# Patient Record
Sex: Female | Born: 1960 | Race: White | Hispanic: No | Marital: Married | Smoking: Current every day smoker
Health system: Southern US, Community
[De-identification: ages and names within clinical notes are randomized; demographics above are authoritative.]

## PROBLEM LIST (undated history)

## (undated) DIAGNOSIS — K219 Gastro-esophageal reflux disease without esophagitis: Secondary | ICD-10-CM

## (undated) DIAGNOSIS — N83209 Unspecified ovarian cyst, unspecified side: Secondary | ICD-10-CM

## (undated) DIAGNOSIS — Z87442 Personal history of urinary calculi: Secondary | ICD-10-CM

## (undated) DIAGNOSIS — R110 Nausea: Secondary | ICD-10-CM

## (undated) DIAGNOSIS — N289 Disorder of kidney and ureter, unspecified: Secondary | ICD-10-CM

## (undated) HISTORY — PX: KIDNEY STONE SURGERY: SHX686

## (undated) HISTORY — PX: BREAST LUMPECTOMY: SHX2

## (undated) HISTORY — DX: Nausea: R11.0

## (undated) HISTORY — PX: TUBAL LIGATION: SHX77

## (undated) HISTORY — PX: SHOULDER SURGERY: SHX246

---

## 1995-02-18 HISTORY — PX: CHOLECYSTECTOMY: SHX55

## 1997-11-06 ENCOUNTER — Emergency Department (HOSPITAL_COMMUNITY): Admission: EM | Admit: 1997-11-06 | Discharge: 1997-11-06 | Payer: Self-pay | Admitting: Emergency Medicine

## 1999-05-30 ENCOUNTER — Other Ambulatory Visit: Admission: RE | Admit: 1999-05-30 | Discharge: 1999-05-30 | Payer: Self-pay | Admitting: Family Medicine

## 2003-02-19 ENCOUNTER — Emergency Department (HOSPITAL_COMMUNITY): Admission: AD | Admit: 2003-02-19 | Discharge: 2003-02-19 | Payer: Self-pay | Admitting: Family Medicine

## 2003-06-02 ENCOUNTER — Emergency Department (HOSPITAL_COMMUNITY): Admission: EM | Admit: 2003-06-02 | Discharge: 2003-06-02 | Payer: Self-pay | Admitting: Emergency Medicine

## 2007-06-07 ENCOUNTER — Encounter: Payer: Self-pay | Admitting: Internal Medicine

## 2007-06-14 ENCOUNTER — Encounter: Payer: Self-pay | Admitting: Internal Medicine

## 2007-06-14 ENCOUNTER — Encounter: Admission: RE | Admit: 2007-06-14 | Discharge: 2007-06-14 | Payer: Self-pay | Admitting: Family Medicine

## 2007-06-16 ENCOUNTER — Encounter (INDEPENDENT_AMBULATORY_CARE_PROVIDER_SITE_OTHER): Payer: Self-pay | Admitting: Diagnostic Radiology

## 2007-06-16 ENCOUNTER — Encounter: Admission: RE | Admit: 2007-06-16 | Discharge: 2007-06-16 | Payer: Self-pay | Admitting: Family Medicine

## 2008-02-18 HISTORY — PX: APPENDECTOMY: SHX54

## 2008-02-18 HISTORY — PX: COLONOSCOPY: SHX174

## 2008-03-31 ENCOUNTER — Ambulatory Visit: Payer: Self-pay | Admitting: Infectious Disease

## 2008-03-31 ENCOUNTER — Inpatient Hospital Stay (HOSPITAL_COMMUNITY): Admission: EM | Admit: 2008-03-31 | Discharge: 2008-04-04 | Payer: Self-pay | Admitting: Emergency Medicine

## 2008-04-01 ENCOUNTER — Encounter: Payer: Self-pay | Admitting: Infectious Disease

## 2008-04-19 ENCOUNTER — Ambulatory Visit: Payer: Self-pay | Admitting: *Deleted

## 2008-04-19 ENCOUNTER — Encounter: Payer: Self-pay | Admitting: Internal Medicine

## 2008-04-19 DIAGNOSIS — R300 Dysuria: Secondary | ICD-10-CM | POA: Insufficient documentation

## 2008-04-19 DIAGNOSIS — R109 Unspecified abdominal pain: Secondary | ICD-10-CM | POA: Insufficient documentation

## 2008-04-19 LAB — CONVERTED CEMR LAB
ALT: 13 units/L (ref 0–35)
AST: 15 units/L (ref 0–37)
Albumin: 4.1 g/dL (ref 3.5–5.2)
Alkaline Phosphatase: 65 units/L (ref 39–117)
BUN: 15 mg/dL (ref 6–23)
Band Neutrophils: 0 % (ref 0–10)
Basophils Absolute: 0 10*3/uL (ref 0.0–0.1)
Basophils Relative: 0 % (ref 0–1)
Bilirubin Urine: NEGATIVE
CO2: 27 meq/L (ref 19–32)
Calcium: 9.3 mg/dL (ref 8.4–10.5)
Chloride: 103 meq/L (ref 96–112)
Creatinine, Ser: 1.03 mg/dL (ref 0.40–1.20)
Eosinophils Absolute: 0 10*3/uL (ref 0.0–0.7)
Eosinophils Relative: 0 % (ref 0–5)
Glucose, Bld: 80 mg/dL (ref 70–99)
HCT: 39.7 % (ref 36.0–46.0)
Hemoglobin, Urine: NEGATIVE
Hemoglobin: 13 g/dL (ref 12.0–15.0)
Ketones, ur: NEGATIVE mg/dL
Leukocytes, UA: NEGATIVE
Lymphocytes Relative: 20 % (ref 12–46)
Lymphs Abs: 2.4 10*3/uL (ref 0.7–4.0)
MCHC: 32.7 g/dL (ref 30.0–36.0)
MCV: 91.5 fL (ref 78.0–100.0)
Monocytes Absolute: 1.1 10*3/uL — ABNORMAL HIGH (ref 0.1–1.0)
Monocytes Relative: 9 % (ref 3–12)
Neutro Abs: 8.6 10*3/uL — ABNORMAL HIGH (ref 1.7–7.7)
Neutrophils Relative %: 71 % (ref 43–77)
Nitrite: NEGATIVE
Platelets: 450 10*3/uL — ABNORMAL HIGH (ref 150–400)
Potassium: 4.4 meq/L (ref 3.5–5.3)
Protein, ur: NEGATIVE mg/dL
RBC: 4.34 M/uL (ref 3.87–5.11)
RDW: 11.9 % (ref 11.5–15.5)
Sodium: 142 meq/L (ref 135–145)
Specific Gravity, Urine: 1.009 (ref 1.005–1.03)
Total Bilirubin: 0.3 mg/dL (ref 0.3–1.2)
Total Protein: 7.3 g/dL (ref 6.0–8.3)
Urine Glucose: NEGATIVE mg/dL
Urobilinogen, UA: 0.2 (ref 0.0–1.0)
WBC: 12.1 10*3/uL — ABNORMAL HIGH (ref 4.0–10.5)
pH: 7 (ref 5.0–8.0)

## 2008-04-20 ENCOUNTER — Encounter: Payer: Self-pay | Admitting: Internal Medicine

## 2008-04-25 ENCOUNTER — Inpatient Hospital Stay (HOSPITAL_COMMUNITY): Admission: AD | Admit: 2008-04-25 | Discharge: 2008-04-29 | Payer: Self-pay

## 2008-04-28 ENCOUNTER — Encounter (INDEPENDENT_AMBULATORY_CARE_PROVIDER_SITE_OTHER): Payer: Self-pay | Admitting: Gastroenterology

## 2010-03-19 NOTE — Miscellaneous (Signed)
Summary: HIPAA Restrictions  HIPAA Restrictions   Imported By: Florinda Marker 04/19/2008 16:04:44  _____________________________________________________________________  External Attachment:    Type:   Image     Comment:   External Document

## 2010-03-19 NOTE — Assessment & Plan Note (Signed)
Summary: DR. Broadus John NEW TO CLINIC/ SB.   Vital Signs:  Patient Profile:   50 Years Old Female Height:     65 inches (165.10 cm) Weight:      156 pounds (70.91 kg) BMI:     26.05 Temp:     98.4 degrees F (36.89 degrees C) oral Pulse rate:   76 / minute BP sitting:   105 / 69  (right arm) Cuff size:   regular  Pt. in pain?   yes    Location:   abdomen    Intensity:   3    Type:       aching  Vitals Entered By: Theotis Barrio NT II (April 19, 2008 2:00 PM)              Is Patient Diabetic? No Nutritional Status BMI of 25 - 29 = overweight  Have you ever been in a relationship where you felt threatened, hurt or afraid?No   Does patient need assistance? Functional Status Self care Ambulation Normal     PCP:  Vassie Loll MD  Chief Complaint:   OVARIAN CYST SEEN BY GYN / SURGERY 2-13/ PT NEW TO OPC / HFU.  History of Present Illness: 50 years old female with pmh significant for recent diverticulitis and hemorrhagic ovarian cysts. Who came tothe clinic for HFU after been admitting secondary to abdominal pain, diarrhea, nausea and vomiting on 03/31/08. Pt reports that after discharge she was feeling good,she finish her Abx and follow with all her appointments; and reports than 3 days prior to this visit she developed abdominal pain, nausea and diarrhea (about 2 episodes per day). Pt is also complaining of dysuria.  She denies any fever,vomiting, chest pain, cough, SOB.     Updated Prior Medication List: OXYCODONE HCL 5 MG TABS (OXYCODONE HCL) 1-2 tabletsevery 6 hours as needed for pain. ZOFRAN 4 MG TABS (ONDANSETRON HCL) 1 tab every 6 hours as needed for nausea and vomiting.     Family History:    Reviewed history and no changes required:       Mother (62y/o) past away on 1999 secondary to liver and colon cancer; (hx of seven bypasses).       Father 56 y/o with heart problem and with a pacemaker.  Social History:    Reviewed history and no changes required:  Alcohol use-yes (occasionally).       Current smoker- almost 1 pack per day.       Married with 4 children (whole family healthy).       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          Risk Factors:  Tobacco use:  current    Year started:  AT THE AGE OF 12    Cigarettes:  Yes -- 1/2 pack(s) per day    Counseled to quit/cut down tobacco use:  yes Passive smoke exposure:  yes Drug use:  no HIV high-risk behavior:  no Alcohol use:  yes    Type:  BEER AT TIMES    Has patient --       Felt need to cut down:  no       Been annoyed by complaints:  no       Felt guilty about drinking:  no       Needed eye opener in the morning:  no Exercise:  no Seatbelt use:      100 %  Family History Risk Factors:  Family History of MI in females < 74 years old:  yes    Family History of MI in males < 72 years old:  no   Review of Systems  The patient denies anorexia, fever, chest pain, syncope, prolonged cough, hemoptysis, melena, and hematochezia.     Physical Exam  General:     alert, well-developed, well-nourished, well-hydrated, and cooperative to examination.   Head:     Normocephalic and atraumatic without obvious abnormalities. No apparent alopecia or balding. Eyes:     No corneal or conjunctival inflammation noted. EOMI. Perrla. Funduscopic exam benign, without hemorrhages, exudates or papilledema. Vision grossly normal. Mouth:     Oral mucosa and oropharynx without lesions or exudates.  Teeth in good repair. Lungs:     Normal respiratory effort, chest expands symmetrically. Lungs are clear to auscultation, no crackles or wheezes. Heart:     Normal rate and regular rhythm. S1 and S2 normal without gallop, murmur, click, rub or other extra sounds. Abdomen:     soft, normal bowel sounds, no distention, abdominal scar(s), epigastric tenderness, RLQ tenderness, and LLQ tenderness.   Msk:     No deformity or scoliosis noted of thoracic or lumbar spine.    Extremities:     No clubbing, cyanosis, edema, or deformity noted with normal full range of motion of all joints.   Neurologic:     alert & oriented X3, cranial nerves II-XII intact, strength normal in all extremities, sensation intact to light touch, and gait normal.      Impression & Recommendations:  Problem # 1:  ABDOMINAL PAIN, SUPRAPUBIC (ICD-789.09) Most likely secondary to her ovarian cyst; she is following with Dr. Senaida Ores for that. Will check CBC and also urinalysis and if diarrhea continue will check for C. diff infection. For now will recommend cont tx with oxycodone and to follow with her obgyn.   Her updated medication list for this problem includes:    Oxycodone Hcl 5 Mg Tabs (Oxycodone hcl) .Marland Kitchen... 1-2 tabletsevery 6 hours as needed for pain.  Orders: T-Comprehensive Metabolic Panel [phone]) T-CBC w/Diff ([id number])   Problem # 2:  DYSURIA (ICD-788.1) Will check a urinalysis and culture; and will treat her if positive UTI. Pt just recently finish Abx therapy including cipro and flagyl and was seen by Obgyn Dr. Senaida Ores for followup on her ovarian cyst.  Orders: T-Culture, Urine ([id number]) T-Urinalysis ([id number])   Complete Medication List: 1)  Oxycodone Hcl 5 Mg Tabs (Oxycodone hcl) .Marland Kitchen.. 1-2 tabletsevery 6 hours as needed for pain. 2)  Zofran 4 Mg Tabs (Ondansetron hcl) .Marland Kitchen.. 1 tab every 6 hours as needed for nausea and vomiting.   Patient Instructions: 1)  You will be called with any abnormalities in the tests scheduled or performed today.  If you don't hear from [place] within a week from when the test was performed, you can assume that your test was normal. 2)  Follow in 1-2 months. (unless results are abnormal and will need to see you sooner). 3)  Tobacco is very bad for your health and your loved ones! You Should stop smoking!. 4)  Stop Smoking Tips: Choose a Quit date. Cut down before the Quit date. decide what you will do as a substitute when  you feel the urge to smoke(gum,toothpick,exercise). 5)  Remember to follow your appointment with Dr. Senaida Ores. 6)  Take your medications as prescribed. 7)  Start taking a daily multivitamin.

## 2010-05-30 LAB — CBC
HCT: 30.1 % — ABNORMAL LOW (ref 36.0–46.0)
HCT: 31.9 % — ABNORMAL LOW (ref 36.0–46.0)
HCT: 38 % (ref 36.0–46.0)
Hemoglobin: 10.4 g/dL — ABNORMAL LOW (ref 12.0–15.0)
Hemoglobin: 11.3 g/dL — ABNORMAL LOW (ref 12.0–15.0)
Hemoglobin: 13.3 g/dL (ref 12.0–15.0)
MCHC: 34.5 g/dL (ref 30.0–36.0)
MCHC: 35 g/dL (ref 30.0–36.0)
MCHC: 35.5 g/dL (ref 30.0–36.0)
MCV: 90.6 fL (ref 78.0–100.0)
MCV: 91.5 fL (ref 78.0–100.0)
MCV: 92.8 fL (ref 78.0–100.0)
Platelets: 231 10*3/uL (ref 150–400)
Platelets: 239 10*3/uL (ref 150–400)
Platelets: 316 10*3/uL (ref 150–400)
RBC: 3.25 MIL/uL — ABNORMAL LOW (ref 3.87–5.11)
RBC: 3.52 MIL/uL — ABNORMAL LOW (ref 3.87–5.11)
RBC: 4.16 MIL/uL (ref 3.87–5.11)
RDW: 11.4 % — ABNORMAL LOW (ref 11.5–15.5)
RDW: 11.8 % (ref 11.5–15.5)
RDW: 11.9 % (ref 11.5–15.5)
WBC: 11.8 10*3/uL — ABNORMAL HIGH (ref 4.0–10.5)
WBC: 17.3 10*3/uL — ABNORMAL HIGH (ref 4.0–10.5)
WBC: 9.5 10*3/uL (ref 4.0–10.5)

## 2010-05-30 LAB — BASIC METABOLIC PANEL
BUN: 1 mg/dL — ABNORMAL LOW (ref 6–23)
BUN: 3 mg/dL — ABNORMAL LOW (ref 6–23)
CO2: 26 mEq/L (ref 19–32)
CO2: 27 mEq/L (ref 19–32)
Calcium: 7.8 mg/dL — ABNORMAL LOW (ref 8.4–10.5)
Calcium: 8.4 mg/dL (ref 8.4–10.5)
Chloride: 103 mEq/L (ref 96–112)
Chloride: 103 mEq/L (ref 96–112)
Creatinine, Ser: 0.65 mg/dL (ref 0.4–1.2)
Creatinine, Ser: 0.67 mg/dL (ref 0.4–1.2)
GFR calc Af Amer: >60 mL/min (ref >60)
GFR calc Af Amer: >60 mL/min (ref >60)
GFR calc non Af Amer: >60 mL/min (ref >60)
GFR calc non Af Amer: >60 mL/min (ref >60)
Glucose, Bld: 105 mg/dL — ABNORMAL HIGH (ref 70–99)
Glucose, Bld: 120 mg/dL — ABNORMAL HIGH (ref 70–99)
Potassium: 3 mEq/L — ABNORMAL LOW (ref 3.5–5.1)
Potassium: 3.8 mEq/L (ref 3.5–5.1)
Sodium: 137 mEq/L (ref 135–145)
Sodium: 138 mEq/L (ref 135–145)

## 2010-05-30 LAB — COMPREHENSIVE METABOLIC PANEL
ALT: 26 U/L (ref 0–35)
AST: 20 U/L (ref 0–37)
Albumin: 3.1 g/dL — ABNORMAL LOW (ref 3.5–5.2)
Alkaline Phosphatase: 101 U/L (ref 39–117)
BUN: 6 mg/dL (ref 6–23)
CO2: 25 mEq/L (ref 19–32)
Calcium: 8.7 mg/dL (ref 8.4–10.5)
Chloride: 99 mEq/L (ref 96–112)
Creatinine, Ser: 0.68 mg/dL (ref 0.4–1.2)
GFR calc Af Amer: >60 mL/min (ref >60)
GFR calc non Af Amer: >60 mL/min (ref >60)
Glucose, Bld: 116 mg/dL — ABNORMAL HIGH (ref 70–99)
Potassium: 3.6 mEq/L (ref 3.5–5.1)
Sodium: 134 mEq/L — ABNORMAL LOW (ref 135–145)
Total Bilirubin: 1 mg/dL (ref 0.3–1.2)
Total Protein: 6.7 g/dL (ref 6.0–8.3)

## 2010-05-30 LAB — CLOSTRIDIUM DIFFICILE EIA
C difficile Toxins A+B, EIA: NEGATIVE
C difficile Toxins A+B, EIA: NEGATIVE

## 2010-06-04 LAB — DIFFERENTIAL
Basophils Absolute: 0 10*3/uL (ref 0.0–0.1)
Basophils Absolute: 0 10*3/uL (ref 0.0–0.1)
Basophils Absolute: 0 10*3/uL (ref 0.0–0.1)
Basophils Absolute: 0 10*3/uL (ref 0.0–0.1)
Basophils Relative: 0 % (ref 0–1)
Basophils Relative: 0 % (ref 0–1)
Basophils Relative: 0 % (ref 0–1)
Basophils Relative: 0 % (ref 0–1)
Eosinophils Absolute: 0 10*3/uL (ref 0.0–0.7)
Eosinophils Absolute: 0 10*3/uL (ref 0.0–0.7)
Eosinophils Absolute: 0.1 10*3/uL (ref 0.0–0.7)
Eosinophils Absolute: 0.1 10*3/uL (ref 0.0–0.7)
Eosinophils Relative: 0 % (ref 0–5)
Eosinophils Relative: 0 % (ref 0–5)
Eosinophils Relative: 1 % (ref 0–5)
Eosinophils Relative: 1 % (ref 0–5)
Lymphocytes Relative: 13 % (ref 12–46)
Lymphocytes Relative: 6 % — ABNORMAL LOW (ref 12–46)
Lymphocytes Relative: 7 % — ABNORMAL LOW (ref 12–46)
Lymphocytes Relative: 25 % (ref 12–46)
Lymphs Abs: 1 10*3/uL (ref 0.7–4.0)
Lymphs Abs: 1.2 10*3/uL (ref 0.7–4.0)
Lymphs Abs: 2.3 10*3/uL (ref 0.7–4.0)
Lymphs Abs: 2.2 10*3/uL (ref 0.7–4.0)
Monocytes Absolute: 0.6 10*3/uL (ref 0.1–1.0)
Monocytes Absolute: 0.9 10*3/uL (ref 0.1–1.0)
Monocytes Absolute: 1 10*3/uL (ref 0.1–1.0)
Monocytes Absolute: 0.9 10*3/uL (ref 0.1–1.0)
Monocytes Relative: 4 % (ref 3–12)
Monocytes Relative: 5 % (ref 3–12)
Monocytes Relative: 6 % (ref 3–12)
Monocytes Relative: 10 % (ref 3–12)
Neutro Abs: 13.8 10*3/uL — ABNORMAL HIGH (ref 1.7–7.7)
Neutro Abs: 15.4 10*3/uL — ABNORMAL HIGH (ref 1.7–7.7)
Neutro Abs: 15.8 10*3/uL — ABNORMAL HIGH (ref 1.7–7.7)
Neutro Abs: 5.7 10*3/uL (ref 1.7–7.7)
Neutrophils Relative %: 81 % — ABNORMAL HIGH (ref 43–77)
Neutrophils Relative %: 89 % — ABNORMAL HIGH (ref 43–77)
Neutrophils Relative %: 89 % — ABNORMAL HIGH (ref 43–77)
Neutrophils Relative %: 63 % (ref 43–77)

## 2010-06-04 LAB — COMPREHENSIVE METABOLIC PANEL
ALT: 29 U/L (ref 0–35)
ALT: 17 U/L (ref 0–35)
AST: 27 U/L (ref 0–37)
AST: 17 U/L (ref 0–37)
Albumin: 2.8 g/dL — ABNORMAL LOW (ref 3.5–5.2)
Albumin: 2.4 g/dL — ABNORMAL LOW (ref 3.5–5.2)
Alkaline Phosphatase: 44 U/L (ref 39–117)
Alkaline Phosphatase: 45 U/L (ref 39–117)
BUN: 10 mg/dL (ref 6–23)
BUN: 5 mg/dL — ABNORMAL LOW (ref 6–23)
CO2: 21 mEq/L (ref 19–32)
CO2: 27 mEq/L (ref 19–32)
Calcium: 7.1 mg/dL — ABNORMAL LOW (ref 8.4–10.5)
Calcium: 7.9 mg/dL — ABNORMAL LOW (ref 8.4–10.5)
Chloride: 113 mEq/L — ABNORMAL HIGH (ref 96–112)
Chloride: 106 mEq/L (ref 96–112)
Creatinine, Ser: 0.7 mg/dL (ref 0.4–1.2)
Creatinine, Ser: 0.79 mg/dL (ref 0.4–1.2)
GFR calc Af Amer: >60 mL/min (ref >60)
GFR calc Af Amer: >60 mL/min (ref >60)
GFR calc non Af Amer: >60 mL/min (ref >60)
GFR calc non Af Amer: >60 mL/min (ref >60)
Glucose, Bld: 100 mg/dL — ABNORMAL HIGH (ref 70–99)
Glucose, Bld: 97 mg/dL (ref 70–99)
Potassium: 3.8 mEq/L (ref 3.5–5.1)
Potassium: 3.6 mEq/L (ref 3.5–5.1)
Sodium: 139 mEq/L (ref 135–145)
Sodium: 138 mEq/L (ref 135–145)
Total Bilirubin: 0.9 mg/dL (ref 0.3–1.2)
Total Bilirubin: 0.5 mg/dL (ref 0.3–1.2)
Total Protein: 5.1 g/dL — ABNORMAL LOW (ref 6.0–8.3)
Total Protein: 4.9 g/dL — ABNORMAL LOW (ref 6.0–8.3)

## 2010-06-04 LAB — CBC
HCT: 30.1 % — ABNORMAL LOW (ref 36.0–46.0)
HCT: 31 % — ABNORMAL LOW (ref 36.0–46.0)
HCT: 32 % — ABNORMAL LOW (ref 36.0–46.0)
HCT: 32.3 % — ABNORMAL LOW (ref 36.0–46.0)
HCT: 41.8 % (ref 36.0–46.0)
HCT: 29 % — ABNORMAL LOW (ref 36.0–46.0)
Hemoglobin: 10.6 g/dL — ABNORMAL LOW (ref 12.0–15.0)
Hemoglobin: 10.8 g/dL — ABNORMAL LOW (ref 12.0–15.0)
Hemoglobin: 11.2 g/dL — ABNORMAL LOW (ref 12.0–15.0)
Hemoglobin: 11.3 g/dL — ABNORMAL LOW (ref 12.0–15.0)
Hemoglobin: 14.7 g/dL (ref 12.0–15.0)
Hemoglobin: 10.2 g/dL — ABNORMAL LOW (ref 12.0–15.0)
MCHC: 34.8 g/dL (ref 30.0–36.0)
MCHC: 35 g/dL (ref 30.0–36.0)
MCHC: 35.1 g/dL (ref 30.0–36.0)
MCHC: 35.3 g/dL (ref 30.0–36.0)
MCHC: 35.4 g/dL (ref 30.0–36.0)
MCHC: 35.1 g/dL (ref 30.0–36.0)
MCV: 92 fL (ref 78.0–100.0)
MCV: 93.7 fL (ref 78.0–100.0)
MCV: 93.9 fL (ref 78.0–100.0)
MCV: 94.2 fL (ref 78.0–100.0)
MCV: 94.3 fL (ref 78.0–100.0)
MCV: 93.5 fL (ref 78.0–100.0)
Platelets: 174 10*3/uL (ref 150–400)
Platelets: 180 10*3/uL (ref 150–400)
Platelets: 203 10*3/uL (ref 150–400)
Platelets: 208 10*3/uL (ref 150–400)
Platelets: 258 10*3/uL (ref 150–400)
Platelets: 182 10*3/uL (ref 150–400)
RBC: 3.22 MIL/uL — ABNORMAL LOW (ref 3.87–5.11)
RBC: 3.29 MIL/uL — ABNORMAL LOW (ref 3.87–5.11)
RBC: 3.4 MIL/uL — ABNORMAL LOW (ref 3.87–5.11)
RBC: 3.43 MIL/uL — ABNORMAL LOW (ref 3.87–5.11)
RBC: 4.55 MIL/uL (ref 3.87–5.11)
RBC: 3.1 MIL/uL — ABNORMAL LOW (ref 3.87–5.11)
RDW: 11.8 % (ref 11.5–15.5)
RDW: 11.8 % (ref 11.5–15.5)
RDW: 12.1 % (ref 11.5–15.5)
RDW: 12.2 % (ref 11.5–15.5)
RDW: 12.2 % (ref 11.5–15.5)
RDW: 11.9 % (ref 11.5–15.5)
WBC: 12.6 10*3/uL — ABNORMAL HIGH (ref 4.0–10.5)
WBC: 16.4 10*3/uL — ABNORMAL HIGH (ref 4.0–10.5)
WBC: 17 10*3/uL — ABNORMAL HIGH (ref 4.0–10.5)
WBC: 17.3 10*3/uL — ABNORMAL HIGH (ref 4.0–10.5)
WBC: 17.8 10*3/uL — ABNORMAL HIGH (ref 4.0–10.5)
WBC: 8.9 10*3/uL (ref 4.0–10.5)

## 2010-06-04 LAB — BASIC METABOLIC PANEL
BUN: 7 mg/dL (ref 6–23)
BUN: 7 mg/dL (ref 6–23)
BUN: 8 mg/dL (ref 6–23)
CO2: 22 mEq/L (ref 19–32)
CO2: 22 mEq/L (ref 19–32)
CO2: 27 mEq/L (ref 19–32)
Calcium: 7.3 mg/dL — ABNORMAL LOW (ref 8.4–10.5)
Calcium: 7.9 mg/dL — ABNORMAL LOW (ref 8.4–10.5)
Calcium: 8.2 mg/dL — ABNORMAL LOW (ref 8.4–10.5)
Chloride: 103 mEq/L (ref 96–112)
Chloride: 105 mEq/L (ref 96–112)
Chloride: 113 mEq/L — ABNORMAL HIGH (ref 96–112)
Creatinine, Ser: 0.72 mg/dL (ref 0.4–1.2)
Creatinine, Ser: 0.78 mg/dL (ref 0.4–1.2)
Creatinine, Ser: 0.81 mg/dL (ref 0.4–1.2)
GFR calc Af Amer: >60 mL/min (ref >60)
GFR calc Af Amer: >60 mL/min (ref >60)
GFR calc Af Amer: >60 mL/min (ref >60)
GFR calc non Af Amer: >60 mL/min (ref >60)
GFR calc non Af Amer: >60 mL/min (ref >60)
GFR calc non Af Amer: >60 mL/min (ref >60)
Glucose, Bld: 103 mg/dL — ABNORMAL HIGH (ref 70–99)
Glucose, Bld: 176 mg/dL — ABNORMAL HIGH (ref 70–99)
Glucose, Bld: 78 mg/dL (ref 70–99)
Potassium: 3.3 mEq/L — ABNORMAL LOW (ref 3.5–5.1)
Potassium: 3.3 mEq/L — ABNORMAL LOW (ref 3.5–5.1)
Potassium: 4 mEq/L (ref 3.5–5.1)
Sodium: 135 mEq/L (ref 135–145)
Sodium: 137 mEq/L (ref 135–145)
Sodium: 139 mEq/L (ref 135–145)

## 2010-06-04 LAB — MAGNESIUM
Magnesium: 1.9 mg/dL (ref 1.5–2.5)
Magnesium: 1.6 mg/dL (ref 1.5–2.5)

## 2010-06-04 LAB — POCT I-STAT, CHEM 8
BUN: 19 mg/dL (ref 6–23)
Calcium, Ion: 1.17 mmol/L (ref 1.12–1.32)
Chloride: 106 mEq/L (ref 96–112)
Creatinine, Ser: 0.8 mg/dL (ref 0.4–1.2)
Glucose, Bld: 94 mg/dL (ref 70–99)
HCT: 44 % (ref 36.0–46.0)
Hemoglobin: 15 g/dL (ref 12.0–15.0)
Potassium: 3.9 mEq/L (ref 3.5–5.1)
Sodium: 142 mEq/L (ref 135–145)
TCO2: 28 mmol/L (ref 0–100)

## 2010-06-04 LAB — HIV ANTIBODY (ROUTINE TESTING W REFLEX): HIV: NONREACTIVE

## 2010-06-04 LAB — URINALYSIS, ROUTINE W REFLEX MICROSCOPIC
Bilirubin Urine: NEGATIVE
Glucose, UA: NEGATIVE mg/dL
Hgb urine dipstick: NEGATIVE
Ketones, ur: NEGATIVE mg/dL
Leukocytes, UA: NEGATIVE
Nitrite: NEGATIVE
Protein, ur: NEGATIVE mg/dL
Specific Gravity, Urine: 1.021 (ref 1.005–1.030)
Urobilinogen, UA: 1 mg/dL (ref 0.0–1.0)
pH: 6.5 (ref 5.0–8.0)

## 2010-06-04 LAB — GC/CHLAMYDIA PROBE AMP, GENITAL
Chlamydia, DNA Probe: NEGATIVE
GC Probe Amp, Genital: NEGATIVE

## 2010-06-04 LAB — PROTIME-INR
INR: 1.3 (ref 0.00–1.49)
INR: 1.3 (ref 0.00–1.49)
Prothrombin Time: 16.1 seconds — ABNORMAL HIGH (ref 11.6–15.2)
Prothrombin Time: 16.1 seconds — ABNORMAL HIGH (ref 11.6–15.2)

## 2010-06-04 LAB — WET PREP, GENITAL
Clue Cells Wet Prep HPF POC: NONE SEEN
Trich, Wet Prep: NONE SEEN
WBC, Wet Prep HPF POC: NONE SEEN
Yeast Wet Prep HPF POC: NONE SEEN

## 2010-06-04 LAB — APTT
aPTT: 32 seconds (ref 24–37)
aPTT: 35 seconds (ref 24–37)

## 2010-06-04 LAB — CULTURE, BLOOD (ROUTINE X 2)
Culture: NO GROWTH
Culture: NO GROWTH

## 2010-06-04 LAB — URINE MICROSCOPIC-ADD ON

## 2010-06-04 LAB — RPR: RPR Ser Ql: NONREACTIVE

## 2010-06-04 LAB — LIPASE, BLOOD: Lipase: 22 U/L (ref 11–59)

## 2010-06-04 LAB — PREGNANCY, URINE: Preg Test, Ur: NEGATIVE

## 2010-07-02 NOTE — Discharge Summary (Signed)
[name], [name]                  ACCOUNT NO.:  [id number]   MEDICAL RECORD NO.:  [id number]          PATIENT TYPE:  INP   LOCATION:  5505                         FACILITY:  [facility]   PHYSICIAN:  Sharlet Salina T. Hoxworth, M.D.DATE OF BIRTH:  24-Feb-1960   DATE OF ADMISSION:  04/25/2008  DATE OF DISCHARGE:  04/29/2008                               DISCHARGE SUMMARY   ADMITTING PHYSICIAN:  Dr. Michaell Cowing.   DISCHARGING PHYSICIAN:  Sharlet Salina T. Hoxworth, MD.   PRIMARY SURGEON:  Almond Lint, MD   OBSTETRICS/GYNECOLOGY:  Huel Cote, MD   CHIEF COMPLAINT/REASON FOR ADMISSION:  In short, Carol [name] is a 50-year-  old female patient 3 weeks post appendectomy.  The initial concern was  for a Meckel diverticulitis.  In the OR, the patient and did not have  any evidence of Meckel diverticulum, appendix was inflamed, but  pathology later revealed a fibrous appendix without evidence of acute  appendicitis.  There was purulence in pelvis, and based on visual  inspection of the pelvic area.  During laparoscopy, the patient's  symptoms seemed more consistent with PID.  She also had a very large  ovarian cyst, which was also seen on CT scan.  Because of concerns for  possible underlying diverticulitis, the patient was continued on Cipro  and Flagyl, but otherwise had an uneventful hospitalization and was  discharged on postop day #3.  She represented to our office on the day  of admission after developing worsening abdominal pain.  After she  completed antibiotic therapy, pain was more focal in the right lower  quadrant.  She had low-grade fevers between 100 and 101 with anorexia  and nausea, but no emesis.  Also some diarrhea with no BM's for 4 days  prior to admission.  She denied any colitis with symptoms such as  vaginal bleeding or discharge.  She had dysuria, but no pyuria or  hematuria.  She had also been previous evaluated by primary care  physician who did a CBC that revealed a white count of  12,100.   On exam in our office, after the patient was seen by Dr. Michaell Cowing, her  abdomen was mildly distended and shows exclusively tender in the right  lower quadrant with guarding, mild peritoneal signs.  Dr. Michaell Cowing felt the  patient need to be admitted and further reevaluated for right lower  abdominal pain to rule out problems, which is abscess versus colitis.   ADMITTING DIAGNOSES:  1. Postoperative right lower quadrant abdominal pain after      appendectomy, rule out abscess versus colitis after antibiotic      therapy.  2. Known complex right ovarian cyst.  3. Recent diarrhea.   HOSPITAL COURSE:  The patient was admitted where she was found to have  white count of 17,300.  CT abdomen and pelvis was done on date of  admission that showed new Mark terminal ileum and proximal colonic wall  thickening, and differential included C. diff colitis, tuberculosis, or  new-onset inflammatory bowel disease that was surrounding mesenteric  fluid, which was ill-defined and without a drainable abscess.  The  patient had been placed on empiric Flagyl treatment to cover possibility  of C. diff colitis, placed on clear liquids and IV fluids.   Over the next several days, the patient continued to improve gradually.  She had no further diarrhea.  No nausea.  She tolerated clear liquids  and her diet was advanced.  Her white count continued to decrease.  She  had no further episodes of fever.  She continued with abdominal pain,  but it was much less then prior to admission, she was complaining of  difficulty having a bowel movements, and MiraLax was added.  Because of  concerns of inflammatory bowel disease, GI was consulted.  The patient  subsequently underwent a colonoscopy by Dr. Matthias Hughs, on April 28, 2008,  which showed mild scattered diverticular disease, mild terminal ileum  inflammatory nonspecific in nature could be secondary to the previously-  mentioned ovarian cystic process with recent  purulence versus NSAID use.  The patient had a small rectosigmoid polyps, which was also sent for  biopsy.   Anticipated date of discharge, April 29, 2008.   PHYSICAL EXAMINATION:  GENERAL:  The patient was afebrile.  VITAL SIGNS:  Stable.   Previous day showed white count had normalized to 9500.  Her potassium  the previous day was 3.0, and she was given oral potassium for  repletion.  Her abdomen was still tender.  She was using IV Dilaudid,  and switched over to oral pain medications because of the concern that  some of the inflammatory change in the terminal ileum and colon may be  related to NSAIDs, no NSAIDs were administered this hospitalization.  I  did discuss this with the patient, her husband, and Dr. Johna Sheriff to  confirm that this was appropriate.  The patient did report to me that  after discharge, she did not use any over-the-counter NSAIDs such as  ibuprofen, Motrin, or Advil and has never really used them on a regular  basis, but it was opted as precaution to not administer these  medications at this time.  In regards to microbiology studies, C. diff  was collected x1 and this was negative, this was discontinued 24 hours  prior to discharge.   If the patient's pain is adequately controlled on oral Percocet, she  will be discharged home on April 29, 2008, otherwise we will reevaluate  discharge at a later date.   FINAL DISCHARGE DIAGNOSES:  1. Right lower quadrant abdominal pain with associated terminal ileum      and cecal thickening.  2. Status post colonoscopy with nonspecific inflammatory changes in      the terminal ileum and cecum, also scattered ticks, and      rectosigmoid polyp biopsy pending.  3. Leukocytosis, resolved.  4. Known complex right ovarian cyst, gynecological workup pending.   DISCHARGE MEDICATIONS:  1. The patient has plain oxycodone available from prior admission,      this was placed on hold in favor of Percocet.  2. Zofran 4 mg p.r.n.  every 6 hours as needed for nausea.  3. Percocet 5/325 one to two tablets every 4 hours as needed for pain.   ADDITIONAL INSTRUCTIONS:  The patient will be allowed to return to work  on Monday, May 08, 2008, from a surgical standpoint, this is pending  her evaluation from Dr. Huel Cote, and GYN, in the event,  additional treatment that would warrant the patient not attending work  is indicated.   DIET:  No restrictions.  WOUND CARE:  None indicated.   ACTIVITY:  No driving for 1 week while taking Percocet.   FOLLOWUP:  1. The patient is to call Dr. Arita Miss office, to be seen in 1 week.      She is to call Dr. Berenda Morale office to be seen as soon as      possible.  She missed her appointment this Friday, April 28, 2008,      due to being hospitalized.  2. Dr. Donavan Burnet office will call the patient if there are any      abnormalities found on her pathology regarding possible IBD or need      for follow up colonoscopy in 5 years if adenoma is found.  I      discussed this with Dr. Madilyn Fireman.      Allison L. Rennis Harding, N.P.      Lorne Skeens. Hoxworth, M.D.  Electronically Signed    ALE/MEDQ  D:  04/29/2008  T:  04/29/2008  Job:  [id number]   cc:   Almond Lint, MD  Huel Cote, M.D.

## 2010-07-02 NOTE — Consult Note (Signed)
NAMECLINTON, WAHLBERG NO.:  [id number]   MEDICAL RECORD NO.:  [id number]          PATIENT TYPE:  INP   LOCATION:  5505                         FACILITY:  [facility]   PHYSICIAN:  Bernette Redbird, M.D.   DATE OF BIRTH:  1960-06-07   DATE OF CONSULTATION:  04/27/2008  DATE OF DISCHARGE:                                 CONSULTATION   We are asked to see Ms. Lefeber today in consultation for right upper  quadrant pain by Dr. Johna Sheriff of [facility].   HISTORY OF PRESENT ILLNESS:  This is a pleasant 50 year old female whose  mother died with colon cancer that was diagnosed at age 73.  She  developed acute severe right lower quadrant pain in February 2010 and  had an exploratory laparotomy with appendectomy on April 01, 2008.  CT prior to surgery showed one small bowel diverticulum in the TI that  was thought to be an inflamed Meckel's diverticulum.  After surgery, the  patient finished 9 days of Cipro and Flagyl and reported feeling better.  Over the last week and half or so, she has developed pain again that  became increasingly severe so much that she was hospitalized on April 25, 2008.  The patient reports having a long-term difficulties with diarrhea  and constipation as well as some abdominal pain after eating.  She says  she frequently has urgent diarrhea after eating as well.  She has not  had any bowel movements this admission.  Her appetite is decreased.  She  has been running a low-grade fever.  She denies any emesis.  However,  she does have some cough and feels crackles in her chest.  She denies  any melena and hematochezia.  The patient has never had a colonoscopy.   PAST MEDICAL HISTORY:  Significant for hemorrhagic right ovarian cyst,  exploratory laparoscopy with appendectomy on April 01, 2008, uterine  fibroid, chronic kidney stones, papilloma in her breasts that has been  removed, question of PID.   SURGERIES:  Cholecystectomy in 1999,  bilateral tubal ligation and  cryoablation of her uterus.   CURRENT MEDICATIONS:  Vicodin, Tylenol, and Zofran.  She finished her  antibiotic therapy on April 14, 2008.   She has an allergy to PENICILLIN.   REVIEW OF SYSTEMS:  As per HPI.   SOCIAL HISTORY:  Positive for 1 pack of tobacco daily x30 years.  No  alcohol, no drug use.   FAMILY HISTORY:  Significant for colon and liver cancer in her mother,  gallbladder disease in her father.  No other bowel disease known in the  family.   PHYSICAL EXAMINATION:  GENERAL:  She is alert and oriented, pleasant to  speak with her.  VITAL SIGNS:  Temperature is 98.3, pulse 65, respirations 18, and blood  pressure is 105/70.  HEART:  Regular rate and rhythm.  LUNGS:  Clear to auscultation anteriorly.  ABDOMEN:  Thin, soft, tender in the right lower quadrant predominately.  She has active bowel sounds.   LABORATORY DATA:  BMET within normal limits.  Hemoglobin of 11.3,  hematocrit 31.9, white count 11.8, and platelets 231,000.   RADIOLOGICAL EXAMS:  A CT of her abdomen and pelvis done on April 25, 2008, that showed the interval development of mod PI thickening and  proximal colonic wall thickening.  This was not on her March 31, 2008, CT scan.  The mesenteric fluid surrounding this area was described  as ill-defined.  Also, on April 03, 2008, she had an abdominal  ultrasound that showed a 2-cm complex right ovarian hemorrhagic cyst.   ASSESSMENT:  1. Dr. Molly Maduro Buccini has seen and examined the patient, collected a      history and reviewed her chart.  His impression is that this is a      pleasant 50 year old female with right-sided abdominal pain.  2. She has terminal ileum and proximal colonic wall thickening.  3. She has antibiotic exposure.  4. She has a low-grade fever.  5. First-degree relative with colon cancer.   PLAN:  Probable colonoscopy with biopsies on April 28, 2008.  We will  follow with you.  Thanks very  much for this consultation.      Stephani Police, PA    ______________________________  Bernette Redbird, M.D.    MLY/MEDQ  D:  04/27/2008  T:  04/28/2008  Job:  [id number]   cc:   Lorne Skeens. Hoxworth, M.D.

## 2010-07-02 NOTE — Op Note (Signed)
NAMEYTZEL, GUBLER                  ACCOUNT NO.:  [id number]   MEDICAL RECORD NO.:  [id number]          PATIENT TYPE:  INP   LOCATION:  5505                         FACILITY:  [facility]   PHYSICIAN:  Bernette Redbird, M.D.   DATE OF BIRTH:  May 06, 1960   DATE OF PROCEDURE:  04/28/2008  DATE OF DISCHARGE:                               OPERATIVE REPORT   PROCEDURE:  Colonoscopy with biopsy.   INDICATION:  This 50 year old female with right lower quadrant pain  following an appendectomy 1 month ago, at which time the appendix was  somewhat fibrotic but not acutely inflamed.  She came back into the  hospital a few days ago and a CT scan at that time showed thickening of  the terminal ileum and descending colon.   FINDINGS:  Mild inflammation of the terminal ileum.  Scattered  diverticulosis.  Small rectosigmoid polyps.   PROCEDURE:  The nature, purpose, and risks of the procedure had been  discussed with the patient and provided written consent.  Sedation was  Phenergan 12.5 mg, fentanyl 100 mcg, and Versed 10 mg IV without  arrhythmias or desaturation.  The Pentax adult video colonoscope was  advanced to the terminal ileum, using a little bit of external abdominal  compression and taking out loops to help facilitate entry into the base  of the cecum.   There was a little bit of erythema of the folds next to the appendiceal  orifice and the cecum, and the orifice of the ileocecal valve, and the  terminal ileum adjacent to the ileocecal valve had some erythema and  edema.  In one of the folds was a small area of exudate, suggestive of a  resolving focal ulceration.  As I advanced up the terminal ileum, the  mucosa looked more normal.  Several biopsies were obtained, but it was  not possible to get the biopsy forceps to go exactly where the exudate  was located.   Pullback was then performed around the colon.  The quality of prep was  very good and it is felt that all areas were adequately  seen.   In the rectosigmoid region, there were 3 diminutive sessile polyps  removed by cold biopsy technique.  Two of these were at about 20 cm, the  other was just inside the anal verge.  No large polyps, cancer or  colitis were observed on this exam.   The patient did have scattered small mouth diverticula in both the  proximal colon and the distal colon.   Retroflexion of the rectum and reinspection of the rectum disclosed no  additional findings other than those stated above.   IMPRESSION:  1. Nonspecific mild inflammation of the terminal ileum at its junction      with the colon.  The appearance could go along with NSAID-induced      inflammation or possibly a reaction to adjacent inflammation from      her known hemorrhagic ovarian cyst.  2. Mild scattered diverticulosis.  3. Small rectosigmoid polyps with family history of colon cancer in      her mother  at age 56.  4. No evident colitis, with particular reference to the ascending      colon, to correlate with the apparent thickening seen on the CT      scan in that area.   PLAN:  1. Await pathology results.  2. I do not see any endoscopic findings that would suggest that the      patient needs to remain on antibiotics at this time, and I would      consider stopping them.  3. The patient will need follow up colonoscopy in 5 years in view of      her family history of colon cancer, regardless of the histologic      findings on today's polyps.           ______________________________  Bernette Redbird, M.D.     RB/MEDQ  D:  04/28/2008  T:  04/28/2008  Job:  [id number]   cc:   Lovie Macadamia  Almond Lint, MD

## 2010-07-02 NOTE — H&P (Signed)
NAMEJONELLE, BANN                  ACCOUNT NO.:  [id number]   MEDICAL RECORD NO.:  [id number]          PATIENT TYPE:  INP   LOCATION:  5505                         FACILITY:  [facility]   PHYSICIAN:  Ardeth Sportsman, MD     DATE OF BIRTH:  14-Aug-1960   DATE OF ADMISSION:  04/25/2008  DATE OF DISCHARGE:                              HISTORY & PHYSICAL   PRIMARY CARE PHYSICIAN:  [facility]ar[facility] manager at [place].   OB/GYN:  Huel Cote, MD   SURGEON:  Almond Lint, MD   REASON FOR ADMISSION:  Severe abdominal pain, failure to thrive,  question of abscess status post surgery.   HISTORY OF PRESENT ILLNESS:  Ms. Lumadue is a 50 year old female who had  severe abdominal pain 3 weeks ago.  The etiology was uncertain but basic  concern is of a possible Meckel diverticulitis.  She was taken to the  operating room and diagnostic laparoscopy was performed by Dr. Almond Lint.  There was no evidence of any Meckel diverticulum, but the  appendix did look somewhat inflamed and this was removed.  There was  purulence in the pelvis.  Postoperatively, she had further GYN workup  which I think showed a complex cyst in the right ovary more likely  hemorrhagic in nature.  There was some question of diverticulitis as  well, and she was transitioned to oral ciprofloxacin and Flagyl.  She  was discharged on postoperative day #3.   She notes that first week she was starting to feel better and taking the  oral antibiotics and advancing her diet.  However, once she came off the  antibiotics she started feeling worsening abdominal pain.  The pain has  become more focal in the right lower quadrant.  She has had low-grade  fevers ranging between 100 and 101.  Her appetite is decreased and she  has had some nausea but no emesis.  She has had intermittent loose  stools and diarrhea.  No hematochezia or melena.  No sick contacts or  travel history.  She normally has a bowel everyday before all this  happened.  She  denies any vaginal bleeding or discharge.  She has had  some dysuria, but no major pyuria or hematuria.   She went and saw her primary care physician last week and they did  laboratory values which revealed a white count of 12.1 which was  slightly elevated.  Her liver function tests were otherwise normal, and  her urinalysis was normal.   [facility] with worsening severe pain.  Based  on concerns, feel like she would warrant admission.   PAST MEDICAL HISTORY:  1. 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  2. Question of PID.  3. Hemorrhagic right ovarian cyst.  4. Uterine fibroid status post cryoablation in the past.  5. Nephrolithiasis and chronic kidney stones in the past.  6. Breast lump status post excision in the past.   PAST SURGICAL HISTORY:  1. Diagnostic laparoscopy with appendectomy by Dr. Almond Lint on      April 01, 2008.  2. Cholecystectomy in 1999.  3. Prior tubal ligation.  4. Prior cryoablation of uterus.  5. Removal of papilloma in right breast.   Allergies to PENICILLIN.   MEDICATIONS:  She has been taking some Vicodin p.r.n. and Tylenol p.r.n.  She was on some Zofran p.r.n. as well.  She is off all antibiotics right  now.   SOCIAL HISTORY:  She has about a 30 pack-year history of tobacco,  currently smokes a pack a day.  She is here today with her husband in  stable relationship.  She is very stressed that she is going to lose her  job since she has had severe abdominal pain and has been in and out of  the hospital for the past month.  No alcohol or other drug use.   FAMILY HISTORY:  Liver and colon cancer her mother and cardiac disease  in father, and brother and sister are otherwise alive and well.   REVIEW OF SYSTEMS:  As noted per HPI, otherwise constitutional weight  gain or weight loss.  Eyes, ENT, cardiac, respiratory, and pulmonary are  negative.  Breasts, skin, musculoskeletal, neurological,  psychiatric,  hepatic, renal, endocrine is otherwise negative except as noted in the  HPI.  Heme, lymph, hematologic is otherwise negative.  GI as noted  above.  No hematuria.  No jaundice.  No change in the color of stools.   PHYSICAL EXAMINATION:  VITAL SIGNS:  Her temperature is 99.8, pulse is  93 although it went up to 105, blood pressure is 122/81, height 5 feet 5  inches, weight 154.  BMI is 25.6.  GENERAL:  She is a well-developed, well-nourished female, leaning over,  obviously uncomfortable and somewhat toxic, but in mild distress.  PSYCHIATRIC:  She seems pleasant and interactive.  She is anxious, but  consolable.  No evidence of any dementia, delirium, psychosis, paranoia.  EYES:  Pupils equal, round, and reactive to light.  Extraocular  movements are intact.  Sclerae nonicteric or injected.  NECK:  Supple without masses.  Trachea is midline.  NEUROLOGIC:  Cranial II through XII are intact.  Hand grip is 5/5, equal  and symmetrical.  No resting or intention tremors.  Her gait appears to  be normal, although she is somewhat hunched over.  HEENT: She is normocephalic.  Mucous membranes are dry, but nasopharynx  and oropharynx are clear.  CHEST:  Clear to auscultation bilaterally.  No wheezes, rales, or  rhonchi.  HEART:  Regular rate and rhythm.  No murmurs, gallops, or rubs.  Normal  radial and dorsalis pedis pulses.  ABDOMEN:  Mildly distended.  She is exquisitely tender at her right  lower quadrant with some guarding.  She has some mild peritoneal signs  as well such as with cough and the ride in and a little bit of the  shake.  The rest of her abdomen is soft.  She does have a little bit of  mild discomfort on palpation.  She has no incisional hernia.  Her  incisions are well healed and have only mild tenderness.  GENITAL:  Normal external female genitalia.  No inguinal hernias.  RECTAL:  Deferred.  MUSCULOSKELETAL:  She has normal range of motion actively on shoulders,   elbows, wrists as well as hips, knees, and ankles.  LYMPHATIC:  No head, neck, axillary, or groin lymphadenopathy.  SKIN:  No petechiae, purpura.  No other sores or lesions.   LABORATORY VALUES:  As noted above from last week with a white count  12.  The rest of the comprehensive metabolic panel is otherwise unremarkable.  Urinalysis is negative.   ASSESSMENT AND PLAN:  A 50 year old female now 3 weeks status post  diagnostic laparoscopy with appendectomy with severe abdominal pain  focused to the right lower quadrant.   I had a long discussion with the patient and her husband.  We could try  and do outpatient workup given her discomfort that has accelerated  especially over the past few days.  I worry that she may have an abscess  or something more concerning in her abdomen.  Perhaps she is having pain  beyond that is more significant than clinically indicated, but it seems  rather intense.  I think at the very least she needs laboratory values  and a CAT scan done within the next 24 hours.  She has not slept well  and is in severe pain, so I think she could be better palliated in the  hospital with regard to her nausea and pain control.  Her urine output  has gone down as well, so I think it would be a help to rehydrate her as  well.  Therefore, after discussion and thinking about things they agreed  to the idea of:  1. Admission.  2. IV fluids.  3. CAT scan of the pelvis with oral and IV contrast to rule out      abscess, bowel obstruction, etc.  4. CBC, complete metabolic panel, urinalysis study.  5. C. diff with recent antibiotic usage even though she was on Flagyl      and known diarrhea.  6. I will hold off on antibiotics until we get a better study.   Discussed the case with my partner, Dr. Violeta Gelinas, who is on-call  tonight and will try and follow up with the patient as well.   We may need to get OB/GYN and Medicine back involved if her workup is  otherwise negative  to see they have any other insights or inputs.       Ardeth Sportsman, MD  Electronically Signed     SCG/MEDQ  D:  04/25/2008  T:  04/26/2008  Job:  [id number]   cc:   [facility].

## 2010-07-02 NOTE — Consult Note (Signed)
[name], Carol NO.:  [id number]   MEDICAL RECORD NO.:  [id number]          PATIENT TYPE:  INP   LOCATION:  2604                         FACILITY:  [facility]   PHYSICIAN:  Almond Lint, MD       DATE OF BIRTH:  [birth year]   DATE OF CONSULTATION:  DATE OF DISCHARGE:                                 CONSULTATION   REQUESTING PHYSICIAN:  Acey Lav, MD.   REASON FOR CONSULTATION:  Abdominal pain, question small bowel  diverticulitis for his Meckel diverticulum.   HISTORY OF PRESENT ILLNESS:  Carol [name] is a 50 year old otherwise  relatively healthy white female who began having severe abdominal pain  this morning at 0430 a.m.  She states at this time that her pain woke  her up from her sleep.  She states that since this time, she has had  some nausea, however she has not had any emesis.  She does state that  she began having diarrhea yesterday.  However, has not noticed any blood  in her stool.  She states her last normal bowel movement was  approximately 1-2 days ago.  She states that she has never had  diverticulitis before or any other episodes similar to this.  She denies  ever having a colonoscopy or endoscopy.  She denies any fevers at home.  At this time, the patient was seen at the emergency department, where  she was found to have a white blood cell count of 17,300.  She also had  a CT scan, which showed questionable small bowel diverticulitis versus a  Meckel diverticulum.  Because of this, we were consulted to see the  patient.   REVIEW OF SYSTEMS:  The patient admits to nausea, and diarrhea.  However, no emesis.  She does have abdominal pain.  She denies any  history of fever, however, at the hospital she was found to have a fever  of 101.1.  She does get frequent bronchitis and shortness of breath,  however.  Currently, she denies any chest pain, shortness of breath, or  swelling in her feet.  She does also have a history of a breast nodule  for which she had a negative biopsy.  Otherwise, please see HPI, and all  other systems are negative.   PAST MEDICAL HISTORY:  1. History of kidney stones, for which she has had surgery for.  2. History of recurrent bronchitis.  3. History of a breast nodule that was biopsied and found to be      negative.   PAST SURGICAL HISTORY:  1. Microscopic cholecystectomy by Dr. Colin Benton, many years ago.  2. Right breast lumpectomy.  3. Some type of surgery for kidney stones, many years ago as well.   SOCIAL HISTORY:  The patient is married. She has 3 children, which are  all boys.  She currently works at [employer].  She admits to using 1 pack  of cigarettes a day.  Otherwise, she states that she drinks on a very  occasional basis.   ALLERGIES:  PENICILLIN.   MEDICATIONS:  She does not  take any.   PHYSICAL EXAMINATION:  GENERAL:  [name] is a 50 year old white female  who is currently lying in bed in no acute distress.  VITAL SIGNS:  Temperature currently is 97.1 with a T-max of 101.1, heart  rate is 86, respirations 18, blood pressure is currently 110/79, but has  been as low as 76/43.  HEENT:  Head is normocephalic, atraumatic.  Sclerae noninjected.  Pupils  are equal, round, and reactive to light.  Ears and nose without any  obvious masses or lesions.  No rhinorrhea.  Mouth is pink but dry.  Throat shows no exudate.  NECK:  Supple.  Trachea is midline.  No thyromegaly.  HEART:  Regular rate and rhythm.  Normal S1 and S2.  No murmurs,  gallops, or rubs are noted.  +2 carotid, radial, and pedal pulses  bilaterally.  LUNGS:  Clear to auscultation bilaterally.  No wheezes, rhonchi, or  rales noted.  Respiratory effort is unlabored.  ABDOMEN:  Soft, but diffusely tender on her lower abdomen.  She does not  have any tympany or rebounding or guarding.  She does have active bowel  sounds and is somewhat distended in her lower abdomen.  She does not  have any masses or hernias noted.   MUSCULOSKELETAL:  All 4 extremities were symmetrical with no cyanosis,  clubbing, or edema.  SKIN:  Warm and dry without any obvious masses, lesions, or rashes.  NEUROLOGIC:  Cranial nerves II through XII appear to be grossly intact.  Deep tendon reflex exam is deferred at this time.  PSYCH:  The patient is alert and oriented x3 with an appropriate affect.   LABORATORY AND DIAGNOSTICS:  White blood cell count is 17,300,  hemoglobin 14.7, hematocrit 41.8, platelet count is 258,000, and  neutrophil count of 89%.  Sodium 142, potassium 3.9, glucose 94, BUN 19,  and creatinine 0.8.  Vaginal wet prep is negative.  CT of the abdomen  and pelvis shows no appendicitis with questionable terminal ileum and  diverticulitis, which is questionable for Meckel diverticulum with an  incidental finding of a right ovarian follicle.   IMPRESSION:  1. Abdominal pain, question small bowel diverticulitis versus Meckel      diverticulum.  2. Tobacco abuse.  3. Leukocytosis.  4. Dehydration.   PLAN:  At this time, the patient will be admitted to a Step-Down Unit  due to her history of hypotension upon admission to the emergency  department.  At this time, Dr. Donell Beers has reviewed the CT scan and feels  that the patient probably just has small bowel diverticulitis instead of  a Meckel diverticulum.  Therefore, at this time, we will treat the  patient conservatively and agree with starting Cipro and Flagyl.  Otherwise, currently while the patient is continuing to have increasing  abdominal pain, the patient should be n.p.o. with bowel rest for now.  Otherwise at this time, we will continue to follow the patient along  with you.   Thank you for this consult.     Letha Cape, PA      Almond Lint, MD  Electronically Signed   KEO/MEDQ  D:  03/31/2008  T:  04/01/2008  Job:  [id number]

## 2010-07-02 NOTE — Op Note (Signed)
NAMEBRYNDA, [name] NO.:  [id number]   MEDICAL RECORD NO.:  [id number]          PATIENT TYPE:  INP   LOCATION:  2604                         FACILITY:  [facility]   PHYSICIAN:  Almond Lint, MD       DATE OF BIRTH:  1960/08/03   DATE OF PROCEDURE:  04/01/2008  DATE OF DISCHARGE:                               OPERATIVE REPORT   PREOPERATIVE DIAGNOSIS:  Acute abdomen with a questionable Meckel  diverticulitis on CT scan.   POSTOPERATIVE DIAGNOSIS:  Abdominal pain.   PROCEDURES:  Diagnostic laparoscopy and laparoscopic appendectomy.   SURGEON:  Almond Lint, MD   ASSISTANT:  Clovis Pu. Cornett, M.D.   ANESTHESIA:  General and local.   FINDINGS:  Positive findings include murky fluid in the pelvis with  hyperemia of the uterus as well as inflamed right retroperitoneum.  Upon  running the bowel, there was no Meckel diverticulum found in the small  intestine.  There was a normal appendix.  The right ovary and salpinx  were visualized and were normal.  Left ovary was seen and did not appear  to be inflamed, but the entire left salpinx was not well visualized.  There was no evidence of sigmoid or cecal diverticulitis.   SPECIMEN:  Appendix to Pathology.   EBL:  Minimal.   COMPLICATIONS:  None known.   PROCEDURE:  Carol [name] was identified in the holding area and taken to  the operating room where she was placed supine on the operating room  table.  General anesthesia was induced.  Her abdomen was prepped and  draped in a sterile fashion.  A central line was placed by Anesthesia in  the left subclavian region.  Her abdomen was prepped and draped in a  sterile fashion.  A supraumbilical vertical incision was made in the  vertical direction.  This was done on #11 blade.  The subcutaneous  tissues were spread with the hemostat, and the Kocher was used to  elevate the umbilical stalk.  Fascia was incised in the midline and  approximately 1 cm with #11 blade.  Two  trocars were placed on either  side of the fascial incision and a 0 Vicryl was used to make a  pursestring suture around this incision.  The Hasson trocar was  introduced into the abdomen, and held to the abdominal wall with the  tail of the suture.  The pneumoperitoneum was achieved with a pressure  of 15 mmHg.  The patient was placed into Trendelenburg, and camera was  used to evaluate the abdomen.  Immediately upon entrance of the camera,  there was no evidence of any inflammation.  Two trocars were placed on  the left side of the abdomen, one in the left mid abdomen, one in the  left lower quadrant.  These are placed after administration of local.  These were done under direct visualization.  Glassman graspers were used  to pull the small intestine out of the pelvis.  There was no  inflammatory changes noted in the small intestine visible initially.  The appendix was seen immediately  and it was normal.  The ileocecal  valve was identified, and the small bowel was run to the ligament of  Treitz.  There was no inflammation of the small bowel and no evidence of  diverticulitis of the small bowel or Meckel diverticulum.  The sigmoid  was examined and did not show evidence of diverticulitis.  The right  ovary and the uterus were well visualized and were normal.  There is  hyperemia and inflammatory changes on top of the uterus.  There was  murky fluid in the pelvis.  The left ovary could be seen on one surface  that was very near the iliac veins, so this was not elevated or  manipulated.  The left salpinx was definitely not seen in its entirety.  The gallbladder was surgically absent.  Once the bowel was run in its  entirety, attention was then directed to the appendix.  The appendix was  taken so as not to confuse the patient and future care takers for the  possibility of appendicitis based on her scar location.  The  mesoappendix was excised at the base.  The appendix was skeletonized  and  a vascular load of the Endo-GIA was used to fire across the  mesoappendix.  A bowel loop was used to fire across the base of the  appendix.  This was then placed in an EndoCatch bag and removed through  the umbilical incision.  The pelvis was irrigated off the murky fluid  for a liter.  The appendiceal stump was visualized and was not bleeding  and was intact.  The pneumoperitoneum was then allowed to evacuate and  the skin was closed using 4-0 Monocryl in a subcuticular fashion.  The  skin was cleaned, dried, and dressed with Dermabond.  The patient was  awakened from anesthesia and taken to PACU in stable condition.      Almond Lint, MD  Electronically Signed     FB/MEDQ  D:  04/01/2008  T:  04/02/2008  Job:  [id number]

## 2010-07-02 NOTE — Discharge Summary (Signed)
NAMELINLEY, MOSKAL NO.:  [id number]   MEDICAL RECORD NO.:  [id number]          PATIENT TYPE:  INP   LOCATION:  3033                         FACILITY:  [facility]   PHYSICIAN:  Acey Lav, MD  DATE OF BIRTH:  [birth year]   DATE OF ADMISSION:  03/31/2008  DATE OF DISCHARGE:  04/04/2008                               DISCHARGE SUMMARY   CONSULTING PHYSICIANS:  1. Almond Lint, MD of Surgery.  2. Huel Cote, MD of OB/GYN.   DISCHARGE DIAGNOSES:  1. Acute abdomen of uncertain etiology, most likely secondary to acute      diverticulitis versus hemorrhagic right ovarian cyst.  2. Diagnostic laparoscopy with laparoscopic appendectomy.  3. Question of Meckel diverticulitis, seen on admission CT, no      corresponding Meckel diverticulum seen on diagnostic laparoscopy.  4. Complex 2-cm cyst within the right ovary, likely representing a      hemorrhagic ovarian cyst, seen on transvaginal ultrasound.  5. Hypotension upon admission, resolved.  6. Status post cryoablation of uterus previously.  7. Status post cholecystectomy in 1999.  8. Prior bilateral tubal ligation.  9. Recent papilloma removed from right breast.  10.Likely perimenopausal.   DISCHARGE MEDICATIONS:  1. P.o. ciprofloxacin 500 mg b.i.d. x9 days.  2. P.o. Flagyl 500 mg t.i.d. x9 days.  3. P.o. Zofran 4 mg q.6 h. p.r.n. nausea.  4. P.o. oxycodone 5 mg 1-2 tablets q.4-6 h. p.r.n. pain.   Please note that the patient was not on any medications prior to  hospital admission and all the above medications were new, and  prescriptions were provided.  The patient was given 30 of Zofran and 40  of the oxycodone.   CONDITION AT DISCHARGE:  The patient's pain had improved substantially  postoperatively, and the patient was tolerating a regular diet and had a  bowel movement.  The patient is scheduled to follow up with Dr. Huel Cote of [facility], and an appointment had been made for  the  patient on April 10, 2008, at 1:30 p.m.  The patient is also  scheduled to follow up with Dr. Vassie Loll of the [facility]patient [facility]  on April 19, 2008, at 2:00 p.m.  The patient is also to follow up with  Dr. Donell Beers in 1-2 weeks for removal of her staples following her  surgery.  At the office visit, please address the patient's followup  appointment with her OB/GYN, Dr. Senaida Ores, and also address the  patient's pain control and compliance with the antibiotics.   PROCEDURES:  1. Diagnostic laparoscopy and laparoscopic appendectomy, performed by      Dr. Donell Beers on April 01, 2008, secondary to an acute abdomen.      Please see, the operative report for further details.  2. CT of the abdomen and pelvis with contrast on March 31, 2008:      Notable findings include a possible small terminal ileal      diverticulum with possible adjacent surrounding inflammatory      change.  This could reflect a small inflamed Meckel diverticulum.  There is no evidence of bowel obstruction or pelvic abscess.      Probable incidental right ovarian follicle.  3. Transvaginal ultrasound, performed on April 03, 2008, with the      following impression:  Normal uterus, normal-sized ovaries, complex      2-cm cyst within the right ovary, likely represents a hemorrhagic      ovarian cyst.  Recommended followup ultrasound in 6-10 weeks.      Collapsed follicle versus small hemorrhagic cyst within the left      ovary.  Interval increase in free fluid in the pelvis may be      related to rupture of ovarian cyst.   CONSULTATIONS:  1. Almond Lint, MD of General Surgery.  2. Huel Cote, MD of [facility] OB/GY[facility].   HISTORY:  The patient is a 50 year old female who is status post  cholecystectomy in 1999, distant nephrolithiasis, prior cryoablation of  uterus for menorrhagia, who presents with sudden onset of sharp, severe,  midline, hypogastric, suprapubic pain that started out suddenly and   awoke her up on the morning of admission at 4:30 a.m.  The patient  reports diarrhea with 3-4 bowel movements, however, loose but not  discolored and no blood that started on the day prior to admission.  The  patient was not in pain one day prior to admission, but the pain today  has been constant with some radiation to her left flank.  The patient  reports nausea but no vomiting and does report a decreased appetite.  The patient denies sick contacts and did have well-cooked cheeseburger  yesterday at a [employer].  The patient treated for a UTI  approximately 1 month ago with Cipro and a Z-Pak.  The patient has had  chronic abdominal pain for several months but nothing as severe as  today.  The patient reports increased pain previously with food.  The  patient reports pain today only relieved with pain medications, which  she has receiving in the ED, including morphine.   PHYSICAL EXAMINATION:  VITAL SIGNS:  Temperature 101.1, blood pressure  76-110/43-79, pulse of 83-87, respiratory rate of 16-18, O2 sat of 94-  100% on room air.  GENERAL:  Sick appearing.  HEENT:  Eyes, EOMI.  Pink conjunctivae.  ENT, dry mucous membranes.  No  oropharyngeal erythema.  NECK:  No neck stiffness.  RESPIRATORY:  Clear to auscultation bilaterally.  Good air movements.  CARDIOVASCULAR:  Regular rate and rhythm.  No murmurs, rubs, or gallops.  GASTROINTESTINAL:  Soft, mildly obese, normal pitch, slightly decreased  frequency of bowel sounds.  Pain reproduced with movement of her right  leg.  EXTREMITIES:  No cyanosis, clubbing, or edema.  A 2+ dorsal pedal pulses  bilaterally with less than 2-second cap refill.  LYMPHATIC:  No lymphadenopathy.  NEUROLOGIC:  Alert and oriented, not lethargic.  PSYCH:  Appropriate.   ADMISSION LABORATORY DATA:  Sodium level of 142, potassium 3.9, chloride  of 106, bicarbonate 28, BUN 19, creatinine 0.8, glucose of 94.  The  patient's urine pregnancy test was  negative.  Trichomonas, yeast, and  clue cells were negative.  GC/C urine test was negative.  Hemoglobin  14.7, white blood cell count 17.3, platelets of 250, MCV of 92.0, RDW of  11.8.  UA was pan negative.   HOSPITAL COURSE:  Problem #1.  Acute abdomen of uncertain etiology:  The  patient initially presented to the ED looking very ill and with blood  pressures as low as  76/43.  The patient also presented with a  temperature of 101.1 and a white blood cell count of 17.  The patient  was aggressively hydrated, and her blood pressure stabilized with  systolics in the 90s-110s.  A stat CT of the abdomen and pelvis was  obtained, which showed the possible Meckel diverticulitis.  For this  reason, Surgery was urgently consulted and came to evaluate the patient.  The patient was also started on Cipro and Flagyl as the patient has a  PENICILLIN allergy and Zosyn was not a viable option.  On the morning  after admission, Surgery came to see the patient and found the patient  to have an acute abdomen and took the patient urgently to the OR for a  diagnostic laparoscopy.  During that procedure, no Meckel diverticulum  was identified although there was discolored fluid within the abdomen  that raised a question for a pelvic etiology.  Incidentally, the  patient's appendix was removed during the procedure.  After the patient  had recovered from the OR, a transvaginal ultrasound was performed,  which revealed a hemorrhagic right ovarian cyst.  It is possible that  this cyst caused the patient's presentation and hemodynamic instability.  However given that there were CT findings of diverticulitis, it is also  possible that the patient had diverticulitis to explain her  presentation.  For this reason, the patient was continued on Cipro and  Flagyl and will receive a total of 14 days of both.  The patient will  also follow up with an OB/GYN physician, Dr. Huel Cote, of  [facility] for  further management of her hemorrhagic right  ovarian cyst.  Again, she has an appointment scheduled for April 10, 2008.  I spoke with Dr. Senaida Ores prior to the patient's discharge and  told her the story of Ms. Bessey's presentation.  She agreed with our  assessment that the patient was stable enough to be discharged and that  the patient's ovarian cyst could be followed up in the outpatient  setting.  Problem #2.  Hypotension:  As described above, the patient initially  presented hypotensive, febrile, and with an elevated white count.  The  patient was aggressively rehydrated and responded well.  A central line  was placed in the OR but was not needed for any pressors.  Problem #3.  Tobacco use:  The patient was put on nicotine patch and  received counseling when she was stable.   DISCHARGE VITAL SIGNS:  The patient had a temperature of 98.9, blood  pressure of 103-113/66-71.  She was sating at 93-97% on room air.  Her  pulse was 57-77.   DISCHARGE LABORATORY DATA:  The patient's last CBC was, white blood cell  count of 8.9, hemoglobin of 10.2, platelet count of 182.  The patient's last CMET was as follows:  Sodium 138, potassium 3.6,  chloride of 106, bicarbonate 27, glucose 97, BUN of 5, creatinine is  0.79.  The patient did have a magnesium of 1.6 on the day of discharge  but then received 2 g of magnesium sulfate IV, which she tolerated well  prior to discharge.   PENDING LABORATORY DATA:  No pending labs at this time.      Linward Foster, MD  Electronically Signed      Acey Lav, MD  Electronically Signed    LW/MEDQ  D:  04/05/2008  T:  04/05/2008  Job:  [id number]   cc:   Almond Lint, MD  Huel Cote, M.D.  Rosanna Randy, MD

## 2010-08-04 IMAGING — CT CT ABDOMEN W/ CM
2 of 5 series · 14 of 32 positions shown, 19 images · IV contrast (water/omni  & 100 ML OMNI 300)
Comparison: 06/02/2003 abdominal pelvic CT.

CT ABDOMEN

CLINICAL DATA: Abdominal pain today with fever since yesterday.
History of cholecystectomy.

CT ABDOMEN AND PELVIS WITH CONTRAST
TECHNIQUE: Multidetector CT imaging of the abdomen and pelvis was
performed using the standard protocol following bolus
administration of intravenous contrast.
Contrast: 100 ml 2mnipaque-XLL intravenously.  Oral contrast was
given.

[Series 2: routine abdomen · axial · 0.74mm/px · z∈[-462,-172]mm · 6 of 82 slices shown, 11 images]
[im 12/82  soft-tissue]
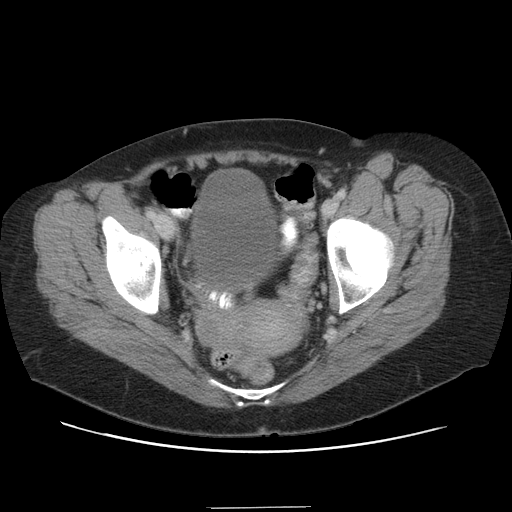
[im 12/82  bone]
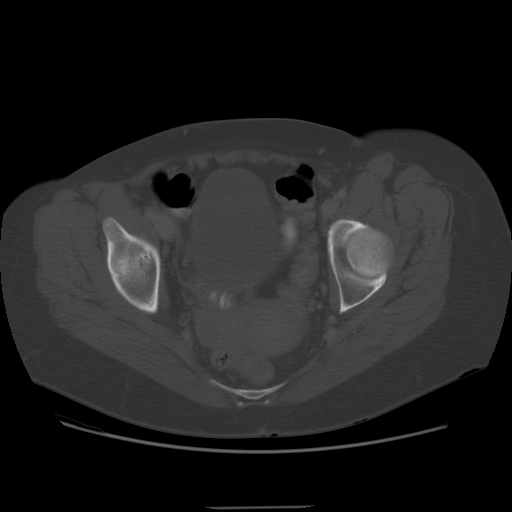
[im 24/82  soft-tissue]
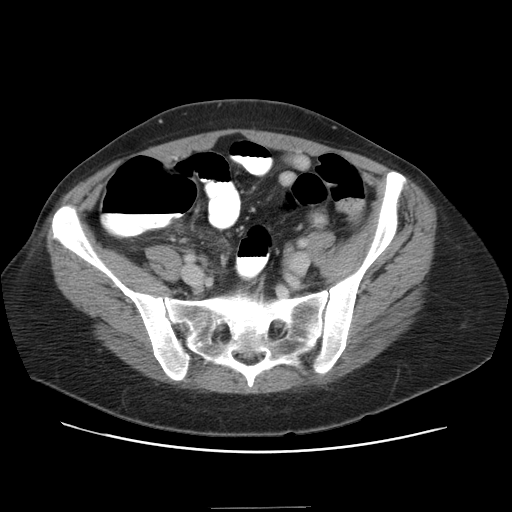
[im 35/82  soft-tissue]
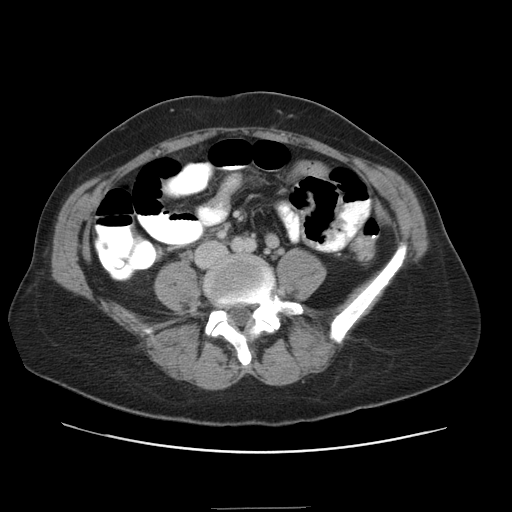
[im 35/82  lung]
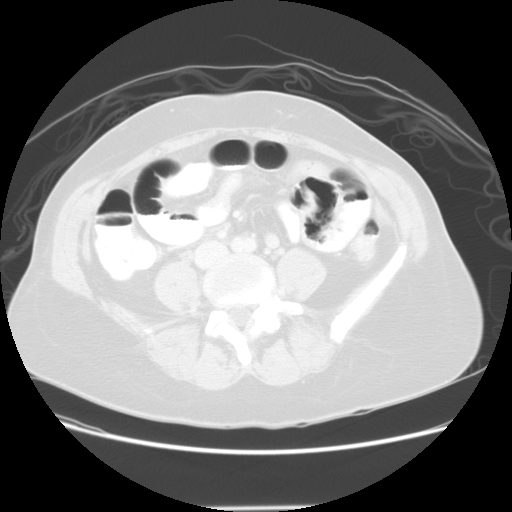
[im 47/82  soft-tissue]
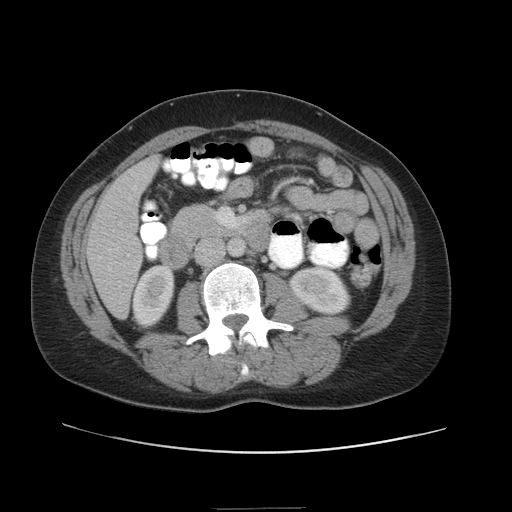
[im 47/82  lung]
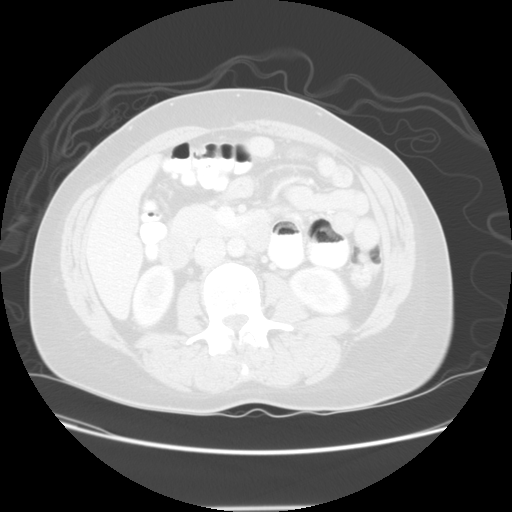
[im 58/82  soft-tissue]
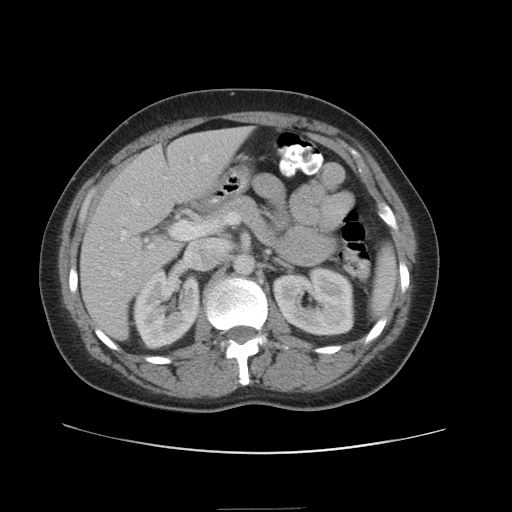
[im 58/82  lung]
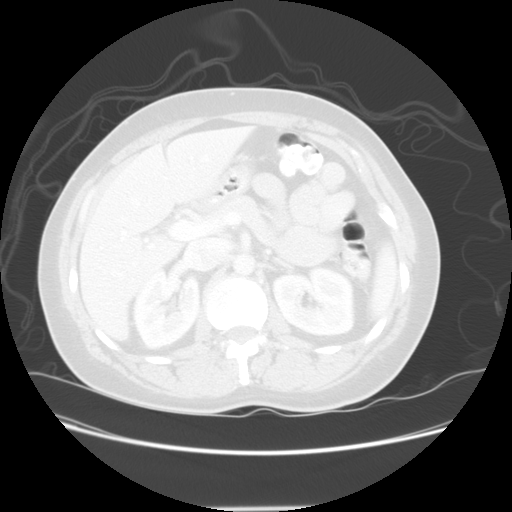
[im 70/82  soft-tissue]
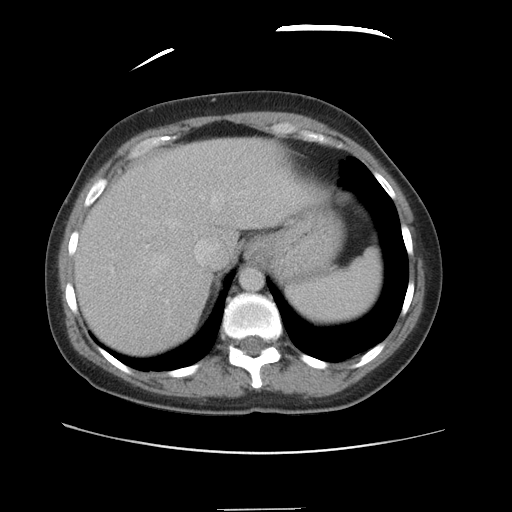
[im 70/82  lung]
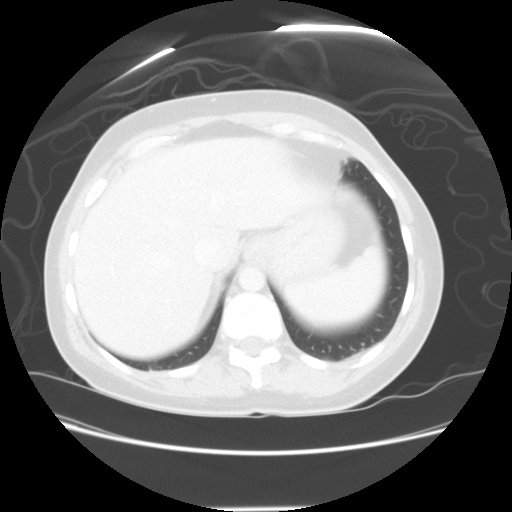

[Series 401: reformatted · coronal · 0.89mm/px · 8 of 105 slices shown]
[im 12/105  soft-tissue]
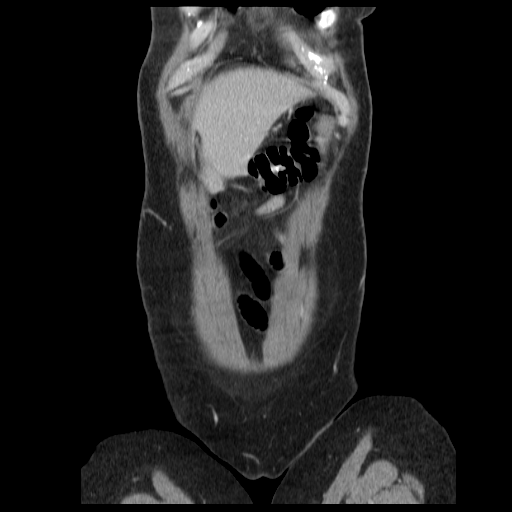
[im 24/105  soft-tissue]
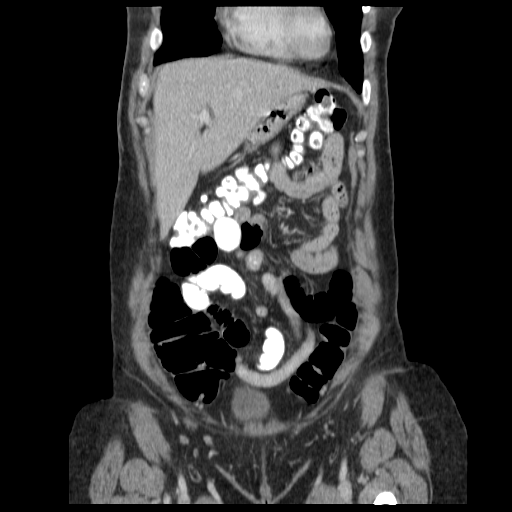
[im 35/105  soft-tissue]
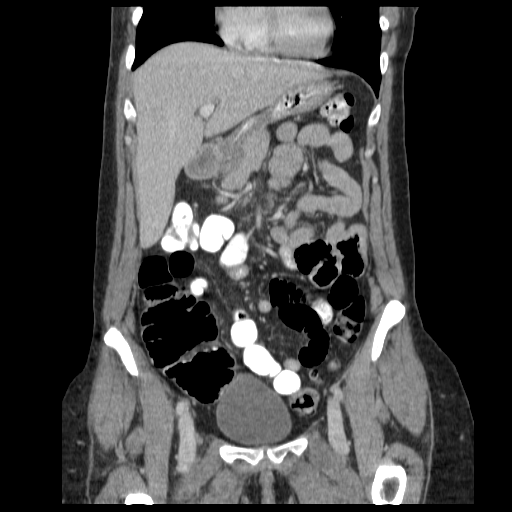
[im 47/105  soft-tissue]
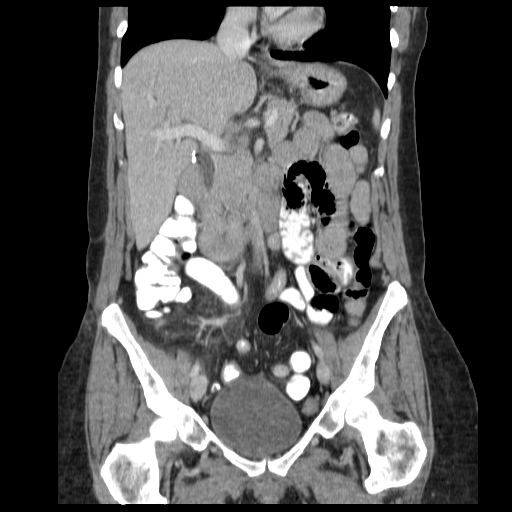
[im 58/105  soft-tissue]
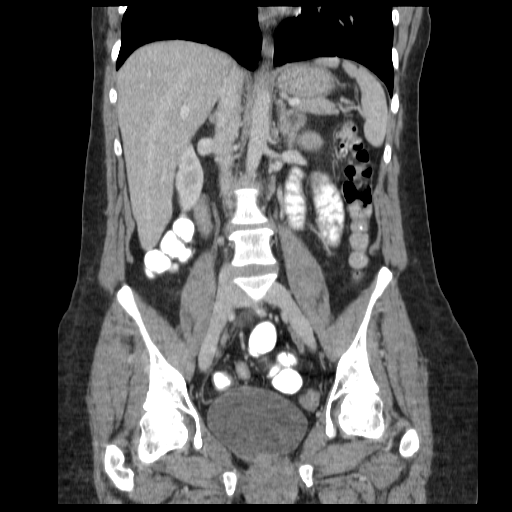
[im 70/105  soft-tissue]
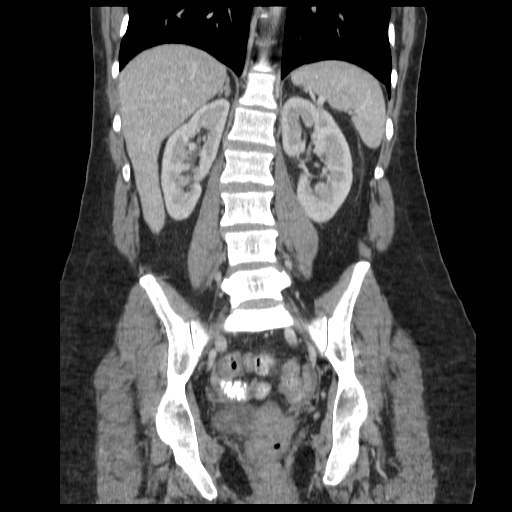
[im 81/105  soft-tissue]
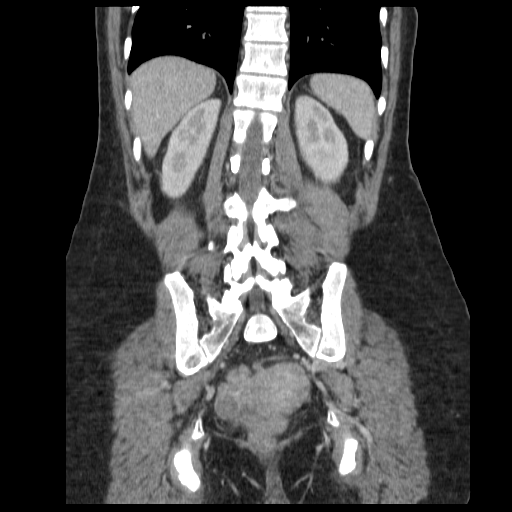
[im 93/105  soft-tissue]
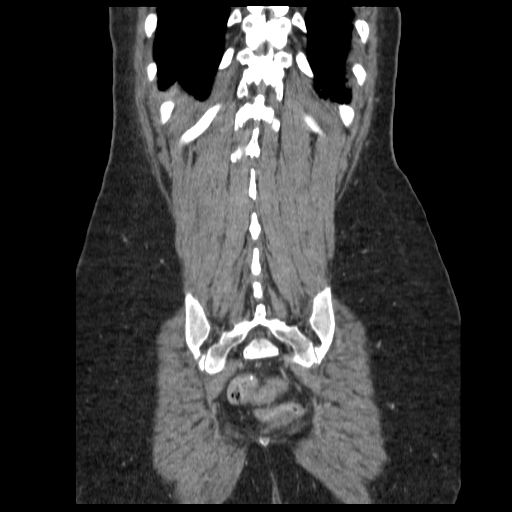

[14 of 32 positions shown; findings below may reference images not displayed]

FINDINGS: The lung bases are clear.  There is no pleural effusion.
There is stable mild biliary dilatation status post
cholecystectomy.  The liver, spleen, adrenal glands and kidneys
appear normal.  The pancreas appears normal.

The bowel gas pattern is normal.  There are no enlarged lymph nodes
or intra-abdominal inflammatory changes.
IMPRESSION: 1.  No acute abdominal findings.
2.  Stable mild biliary dilatation status post cholecystectomy,
likely physiologic.

CT PELVIS
FINDINGS: There is a normal-caliber contrast filled appendix on
images 69-71.  There is no surrounding inflammatory change.  The
terminal ileum and cecum demonstrate no wall thickening or luminal
narrowing.  There is a possible small (1 cm) diverticulum
projecting superiorly from the terminal ileum, best seen on the
reformatted images.  There is possible minimal inflammatory change
in the adjacent ileocolonic mesenteric fat.

The uterus and left ovary appear normal.  There is a 2.1 cm right
ovarian follicle on image 72.  No significant free pelvic fluid is
present.
IMPRESSION: 1.  No evidence of acute appendicitis.
2.  Possible small terminal ileal diverticulum with possible
adjacent surrounding inflammatory change.  This could reflect a
small inflamed Meckel's diverticulum.  There is no evidence of
bowel obstruction or pelvic abscess.
3.  Probable incidental right ovarian follicle.

REF:G1 DICTATED: 03/31/2008 [DATE]

## 2010-08-05 IMAGING — CR DG CHEST 1V PORT
1 series · 1 of 1 positions shown · non-contrast
Comparison: None

CLINICAL DATA: Status post central venous catheter placement

PORTABLE CHEST - 1 VIEW

[AP]
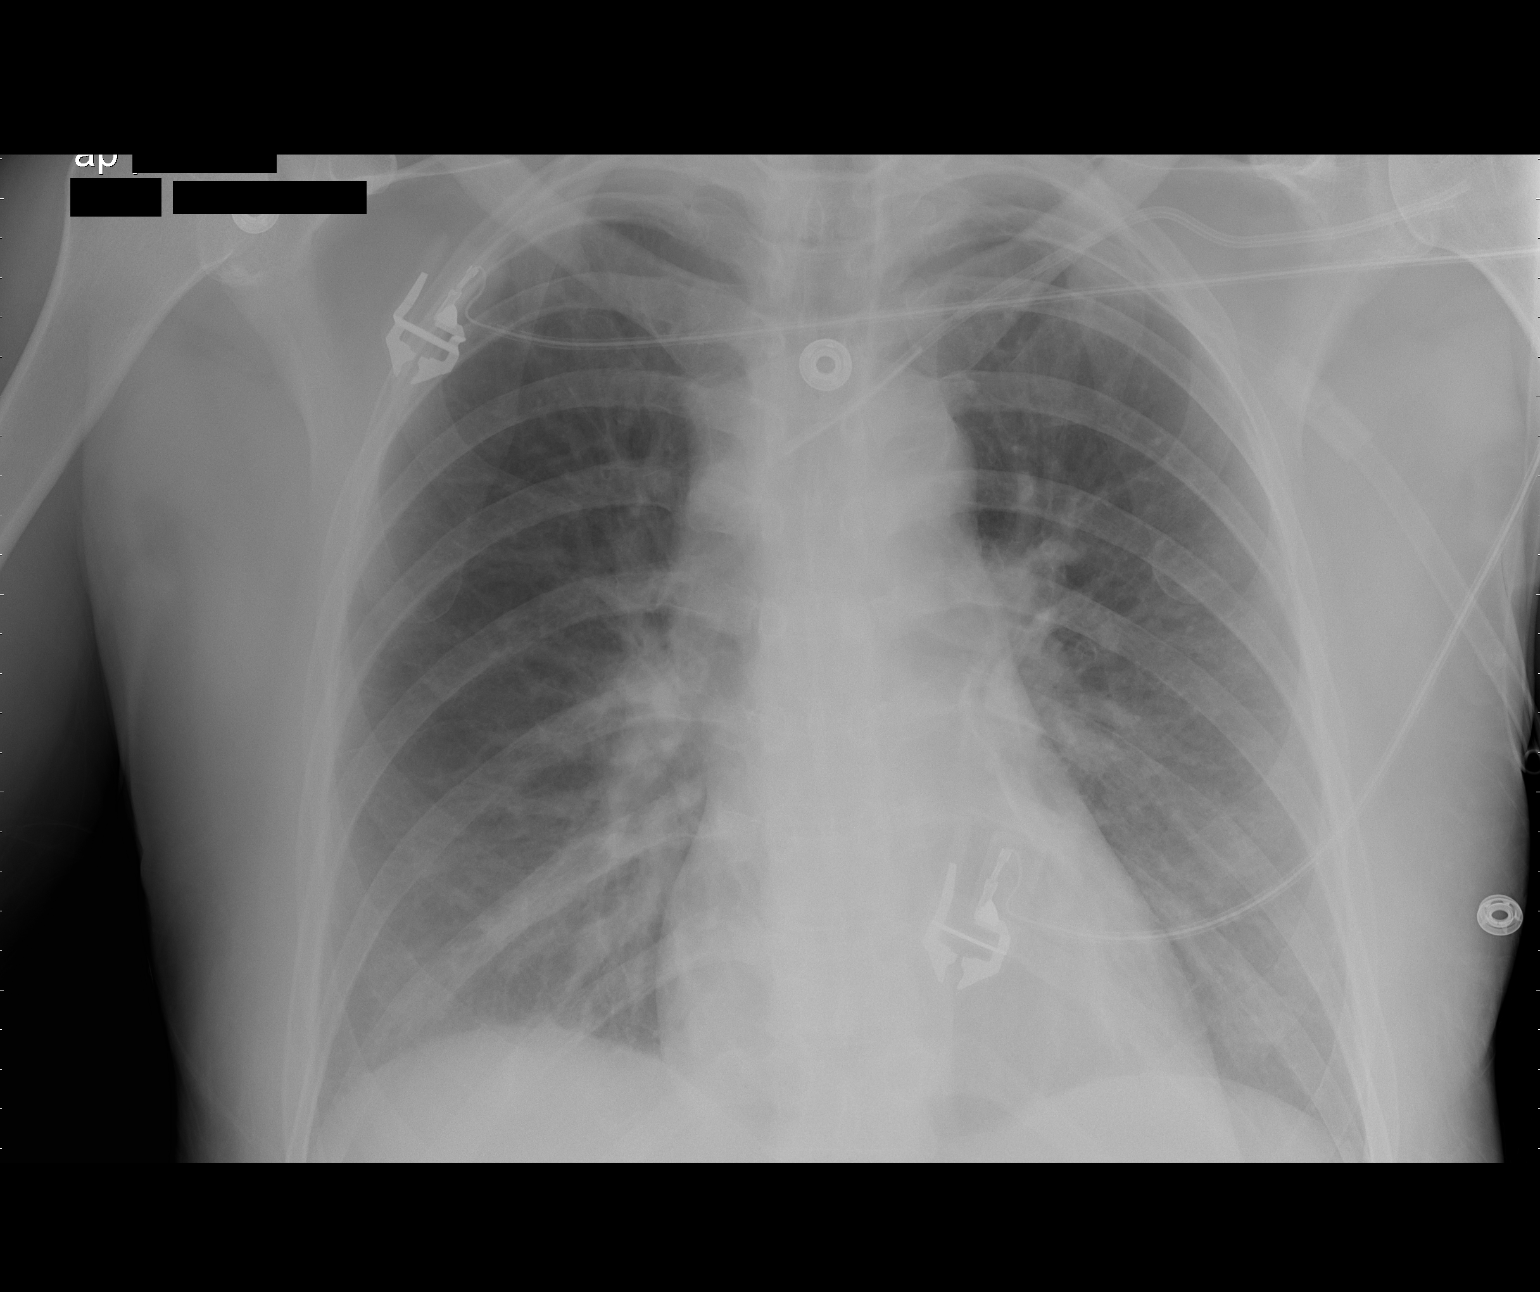

[1 of 1 positions shown; findings below may reference images not displayed]

FINDINGS: There is a left subclavian central venous catheter with
tip in the projection the left innominate vein.

The heart size is normal.

No pleural effusions noted.

There is no pneumothorax identified.

Mild dependent changes are seen at both lung bases.
IMPRESSION: 1.  No complications after placement of left subclavian catheter.
The tip is in the projection of the innominate vein.

REF:G5 DICTATED: 04/01/2008 [DATE]

## 2011-05-27 ENCOUNTER — Emergency Department (HOSPITAL_COMMUNITY)
Admission: EM | Admit: 2011-05-27 | Discharge: 2011-05-27 | Disposition: A | Discharge status: Home / Self Care | Payer: BC Managed Care – PPO | Attending: Emergency Medicine | Admitting: Emergency Medicine

## 2011-05-27 ENCOUNTER — Encounter (HOSPITAL_COMMUNITY): Payer: Self-pay | Admitting: *Deleted

## 2011-05-27 ENCOUNTER — Emergency Department (HOSPITAL_COMMUNITY): Payer: BC Managed Care – PPO

## 2011-05-27 DIAGNOSIS — R110 Nausea: Secondary | ICD-10-CM | POA: Insufficient documentation

## 2011-05-27 DIAGNOSIS — A499 Bacterial infection, unspecified: Secondary | ICD-10-CM | POA: Insufficient documentation

## 2011-05-27 DIAGNOSIS — R1030 Lower abdominal pain, unspecified: Secondary | ICD-10-CM

## 2011-05-27 DIAGNOSIS — N760 Acute vaginitis: Secondary | ICD-10-CM

## 2011-05-27 DIAGNOSIS — R109 Unspecified abdominal pain: Secondary | ICD-10-CM | POA: Insufficient documentation

## 2011-05-27 DIAGNOSIS — B9689 Other specified bacterial agents as the cause of diseases classified elsewhere: Secondary | ICD-10-CM | POA: Insufficient documentation

## 2011-05-27 DIAGNOSIS — R112 Nausea with vomiting, unspecified: Secondary | ICD-10-CM | POA: Insufficient documentation

## 2011-05-27 DIAGNOSIS — A599 Trichomoniasis, unspecified: Secondary | ICD-10-CM

## 2011-05-27 HISTORY — DX: Unspecified ovarian cyst, unspecified side: N83.209

## 2011-05-27 LAB — BASIC METABOLIC PANEL
BUN: 11 mg/dL (ref 6–23)
CO2: 26 mEq/L (ref 19–32)
Calcium: 9.3 mg/dL (ref 8.4–10.5)
Chloride: 105 mEq/L (ref 96–112)
Creatinine, Ser: 0.69 mg/dL (ref 0.50–1.10)
GFR calc Af Amer: >90 mL/min (ref >90)
GFR calc non Af Amer: >90 mL/min (ref >90)
Glucose, Bld: 96 mg/dL (ref 70–99)
Potassium: 4.5 mEq/L (ref 3.5–5.1)
Sodium: 140 mEq/L (ref 135–145)

## 2011-05-27 LAB — DIFFERENTIAL
Basophils Absolute: 0 10*3/uL (ref 0.0–0.1)
Basophils Relative: 0 % (ref 0–1)
Eosinophils Absolute: 0.1 10*3/uL (ref 0.0–0.7)
Eosinophils Relative: 1 % (ref 0–5)
Lymphocytes Relative: 30 % (ref 12–46)
Lymphs Abs: 2.4 10*3/uL (ref 0.7–4.0)
Monocytes Absolute: 0.6 10*3/uL (ref 0.1–1.0)
Monocytes Relative: 8 % (ref 3–12)
Neutro Abs: 5.1 10*3/uL (ref 1.7–7.7)
Neutrophils Relative %: 62 % (ref 43–77)

## 2011-05-27 LAB — URINALYSIS, ROUTINE W REFLEX MICROSCOPIC
Bilirubin Urine: NEGATIVE
Glucose, UA: NEGATIVE mg/dL
Hgb urine dipstick: NEGATIVE
Ketones, ur: NEGATIVE mg/dL
Leukocytes, UA: NEGATIVE
Nitrite: NEGATIVE
Protein, ur: NEGATIVE mg/dL
Specific Gravity, Urine: 1.006 (ref 1.005–1.030)
Urobilinogen, UA: 0.2 mg/dL (ref 0.0–1.0)
pH: 6.5 (ref 5.0–8.0)

## 2011-05-27 LAB — CBC
HCT: 39.9 % (ref 36.0–46.0)
Hemoglobin: 13.7 g/dL (ref 12.0–15.0)
MCH: 31.1 pg (ref 26.0–34.0)
MCHC: 34.3 g/dL (ref 30.0–36.0)
MCV: 90.7 fL (ref 78.0–100.0)
Platelets: 241 10*3/uL (ref 150–400)
RBC: 4.4 MIL/uL (ref 3.87–5.11)
RDW: 11.4 % — ABNORMAL LOW (ref 11.5–15.5)
WBC: 8.2 10*3/uL (ref 4.0–10.5)

## 2011-05-27 LAB — WET PREP, GENITAL: Yeast Wet Prep HPF POC: NONE SEEN

## 2011-05-27 LAB — PREGNANCY, URINE: Preg Test, Ur: NEGATIVE

## 2011-05-27 MED ORDER — ONDANSETRON HCL 4 MG/2ML IJ SOLN
4.0000 mg | Freq: Once | INTRAMUSCULAR | Status: AC
  Administered 2011-05-27: 4 mg via INTRAVENOUS
  Filled 2011-05-27: qty 2

## 2011-05-27 MED ORDER — IOHEXOL 300 MG/ML  SOLN
80.0000 mL | Freq: Once | INTRAMUSCULAR | Status: AC | PRN
  Administered 2011-05-27: 80 mL via INTRAVENOUS

## 2011-05-27 MED ORDER — METRONIDAZOLE 500 MG PO TABS
2000.0000 mg | ORAL_TABLET | ORAL | Status: AC
  Administered 2011-05-27: 2000 mg via ORAL
  Filled 2011-05-27: qty 4

## 2011-05-27 MED ORDER — IBUPROFEN 800 MG PO TABS: 800.0000 mg | ORAL_TABLET | Freq: Three times a day (TID) | ORAL | Status: AC | PRN

## 2011-05-27 MED ORDER — MORPHINE SULFATE 4 MG/ML IJ SOLN
4.0000 mg | Freq: Once | INTRAMUSCULAR | Status: AC
  Administered 2011-05-27: 4 mg via INTRAVENOUS
  Filled 2011-05-27: qty 1

## 2011-05-27 MED ORDER — METRONIDAZOLE 500 MG PO TABS: 500.0000 mg | ORAL_TABLET | Freq: Two times a day (BID) | ORAL | Status: AC

## 2011-05-27 NOTE — ED Notes (Signed)
Discharged home with written and verbal instructions.  No questions or concerns at discharged

## 2011-05-27 NOTE — ED Provider Notes (Signed)
History     CSN: [id number]  Arrival date & time 05/27/11  7829   First MD Initiated Contact with Patient 05/27/11 [phone]      Chief Complaint  Patient presents with  . Abdominal Pain  . Nausea    (Consider location/radiation/quality/duration/timing/severity/associated sxs/prior treatment) HPI Comments: Patient reports she has had lower abdominal pain intermittently for months.  The pain is described as crampy and occasionally sharp.  It is located both in her lower abdomen and her perineum.  Patient also has dysuria, frequency, and abnormal odor with urination, abnormal vaginal discharge with abnormal odor, subjective fevers.  Patient is having her normal bowel movements of intermittent diarrhea and constipation.  Denies melena or hematochezia, vomiting.  Pt was seen by PCP for this problem and treated for UTI, without improvement.  Pt has hx diverticulitis, ovarian cyst, UTI.  LMP 2-3 years ago.    Patient is a 51 y.o. female presenting with abdominal pain. The history is provided by the patient.  Abdominal Pain The primary symptoms of the illness include abdominal pain, fever, nausea, diarrhea, dysuria and vaginal discharge. The primary symptoms of the illness do not include vomiting or vaginal bleeding.  The dysuria is associated with frequency.  The vaginal discharge is associated with dysuria.   Additional symptoms associated with the illness include constipation and frequency.    Past Medical History  Diagnosis Date  . Diverticula of colon   . Ovarian cyst     Past Surgical History  Procedure Date  . Cholecystectomy   . Appendectomy   . Tubal ligation   . Kidney stone surgery     No family history on file.  History  Substance Use Topics  . Smoking status: Current Everyday Smoker  . Smokeless tobacco: Not on file  . Alcohol Use: Yes    OB History    Grav Para Term Preterm Abortions TAB SAB Ect Mult Living                  Review of Systems  Constitutional:  Positive for fever. Negative for activity change and appetite change.  Gastrointestinal: Positive for nausea, abdominal pain, diarrhea and constipation. Negative for vomiting, blood in stool and anal bleeding.  Genitourinary: Positive for dysuria, frequency and vaginal discharge. Negative for vaginal bleeding.  All other systems reviewed and are negative.    Allergies  Penicillins  Home Medications  No current outpatient prescriptions on file.  BP 115/80  Pulse 63  Temp(Src) 98 F (36.7 C) (Oral)  Resp 16  Ht 5\' 6"  (1.676 m)  SpO2 100%  Physical Exam  Nursing note and vitals reviewed. Constitutional: She is oriented to person, place, and time. She appears well-developed and well-nourished.  HENT:  Head: Normocephalic and atraumatic.  Neck: Neck supple.  Cardiovascular: Normal rate, regular rhythm and normal heart sounds.   Pulmonary/Chest: Breath sounds normal. No respiratory distress. She has no wheezes. She has no rales. She exhibits no tenderness.  Abdominal: Soft. Bowel sounds are normal. She exhibits no distension and no mass. There is tenderness. There is no rebound and no guarding.    Genitourinary: Uterus is tender. Cervix exhibits no motion tenderness, no discharge and no friability. Right adnexum displays no mass, no tenderness and no fullness. Left adnexum displays no mass, no tenderness and no fullness. Vaginal discharge found.  Neurological: She is alert and oriented to person, place, and time.  Psychiatric: She has a normal mood and affect. Her behavior is normal. Judgment and thought  content normal.    ED Course  Procedures (including critical care time)  Labs Reviewed  CBC - Abnormal; Notable for the following:    RDW 11.4 (*)    All other components within normal limits  WET PREP, GENITAL - Abnormal; Notable for the following:    Trich, Wet Prep FEW (*)    Clue Cells Wet Prep HPF POC FEW (*)    WBC, Wet Prep HPF POC FEW (*)    All other components  within normal limits  URINALYSIS, ROUTINE W REFLEX MICROSCOPIC  PREGNANCY, URINE  DIFFERENTIAL  BASIC METABOLIC PANEL  GC/CHLAMYDIA PROBE AMP, GENITAL   Ct Abdomen Pelvis W Contrast  05/27/2011  *RADIOLOGY REPORT*  Clinical Data: Bilateral lower abdominal pain, nausea/vomiting, prior appendectomy, cholecystectomy, and surgical removal of right renal calculus.  CT ABDOMEN AND PELVIS WITH CONTRAST  Technique:  Multidetector CT imaging of the abdomen and pelvis was performed following the standard protocol during bolus administration of intravenous contrast.  Contrast: 80mL OMNIPAQUE IOHEXOL 300 MG/ML  SOLN  Comparison: 04/25/2008  Findings: Mild dependent atelectasis in the bilateral lower lobes.  Vague hypervascular lesion in the left hepatic lobe (series 2/image 12), only visible on the arterial phase, possibly reflecting a perfusion anomaly but new from prior studies.  Spleen, pancreas, and adrenal glands are within normal limits.  Status post cholecystectomy.  No intrahepatic ductal dilatation.  Kidneys are within normal limits.  No hydronephrosis.  No evidence of bowel obstruction.  Prior appendectomy.  Atherosclerotic calcifications of the abdominal aorta and branch vessels.  No abdominopelvic ascites.  No suspicious abdominopelvic lymphadenopathy.  Uterus and bilateral ovaries are unremarkable.  Bladder is within normal limits.  Mild degenerative changes of the visualized thoracolumbar spine.  IMPRESSION: No evidence of bowel obstruction.  No CT findings to account for the patient's lower abdominal pain.  Vague hypervascular lesion in the left hepatic lobe, possibly reflecting a perfusion anomaly but new from prior studies. Consider MRI abdomen with/without contrast for further characterization.  Original Report Authenticated By: Charline Bills, M.D.   2:49 PM Patient reports she is feeling better.  Discussed all results with patient and husband (with patient's permission).  I have advised husband to  be tested and treated for trichomonas, and advised no sexual activity until both partners are treated.  Advised close follow up with PCP and for PCP to follow up on liver abnormality.  Patient verbalizes understanding and agrees with plan.     1. Lower abdominal pain   2. Trichomonas   3. Bacterial vaginosis       MDM  Patient with several months of unchanged lower abdominal pain.  Workup remarkable only for trichomonas and BV on wet prep, and possible abnormality of liver on CT scan.  Pt d/c home with NSAIDs for pain and PCP follow up. Pt advised that husband will need to be treated for trichomonas, also advised PCP will need to follow possible liver abnormality. Patient verbalizes understanding and agrees with plan.          Rise Patience, [place] 05/27/11 1453

## 2011-05-27 NOTE — ED Notes (Signed)
Pt presents to department for evaluation of lower abdominal pain. States pain has been ongoing for several days. Also states dysuria, foul smelling urine, vaginal discharge, and nausea. 8/10 pain at the time. Denies bloody stools, denies hematuria. States she feels bloated and weak. Abdomen soft, lower quadrants tender to palpation. Bowel sounds present all quadrants. Ambulatory to triage. She is alert and oriented x4.

## 2011-05-27 NOTE — ED Notes (Signed)
PT HAS FINISHED HER CT PREP

## 2011-05-27 NOTE — Discharge Instructions (Signed)
Read the information below.  Please call Dr Collins Scotland today to schedule a follow up appointment.  Please have them look at all of the records of the tests you had here.  You will need to have follow up on the abnormality seen on your liver on the CT scan.  You have been treated for trichomonas.  As we discussed, your sexual partner must be tested and treated.  Please do not have sexual activity until both of you are treated.  You may return to the ER at any time for worsening condition or any new symptoms that concern you.  Abdominal Pain Abdominal pain can be caused by many things. Your caregiver decides the seriousness of your pain by an examination and possibly blood tests and X-rays. Many cases can be observed and treated at home. Most abdominal pain is not caused by a disease and will probably improve without treatment. However, in many cases, more time must pass before a clear cause of the pain can be found. Before that point, it may not be known if you need more testing, or if hospitalization or surgery is needed. HOME CARE INSTRUCTIONS   Do not take laxatives unless directed by your caregiver.   Take pain medicine only as directed by your caregiver.   Only take over-the-counter or prescription medicines for pain, discomfort, or fever as directed by your caregiver.   Try a clear liquid diet (broth, tea, or water) for as long as directed by your caregiver. Slowly move to a bland diet as tolerated.  SEEK IMMEDIATE MEDICAL CARE IF:   The pain does not go away.   You have a fever.   You keep throwing up (vomiting).   The pain is felt only in portions of the abdomen. Pain in the right side could possibly be appendicitis. In an adult, pain in the left lower portion of the abdomen could be colitis or diverticulitis.   You pass bloody or black tarry stools.  MAKE SURE YOU:   Understand these instructions.   Will watch your condition.   Will get help right away if you are not doing well or  get worse.  Document Released: 11/13/2004 Document Revised: 01/23/2011 Document Reviewed: 09/22/2007 [facility] Patient Information 2012 [employer] [employer], [place].  General Instructions for Vaginal Infections Vaginitis is a term to describe many common vaginal infections. These infections may be due to an imbalance of normal germs (bacteria) that exist in the vagina. Many others are caused by sexually transmitted diseases (STD's). If any medication was prescribed to treat your specific infection, it is very important that you take the medication as directed. Your caregiver may want to examine and treat your sex partner. CAUSES  The vagina normally contains organisms (bacteria and yeast) in a balance. Certain factors can disturb this balance and cause an infection, such as:  Sexual intercourse.   Nursing.   Pregnancy.   Menopause.   Hormone changes in the body.   Antibiotics.   Infection elsewhere in your body.   Birth control pills or patches.   Douches.   Spermicides.   Medical illnesses (diabetes).  SYMPTOMS  Different types of vaginal infections cause symptoms such as:  Itching.   Pain or burning.   Bad odor.   Pain or bleeding with sexual intercourse.   Redness of the vulva.   Abnormal discharge (yellow, green, heavy white and thick).   Fever.   A sore on the vulva or vagina.   Urinary symptoms (painful or bloody urine).  Pelvic and/or abdominal pain.   Rectal bleeding, discharge or pain.  DIAGNOSIS   Your caregiver will base the diagnosis upon the symptoms that you report.   A complete history of your sex life may be taken   You may have a pelvic exam.   A sample of your vaginal fluid and/or discharge will be examined under the microscope.   Cultures will help complete the exact diagnosis.  TREATMENT  Treatment depends on the cause of your vaginitis. Your treatment may include a medicine that kills germs (antibiotic). The antibiotic may be a shot, a pill,  and/or vaginal suppository or cream. It is not uncommon for more than one type of infection to be present. If more than one infection is present, two or more medications may be required. Reoccurrence of vaginal infections may be treated with vaginal suppositories or vaginal cream 2 times a week, or as directed. If your caregiver finds that an STD exists, treatment of your sexual partner(s) is important. This is especially important for those infected with chlamydia, gonorrhea, trichomoniasis, bacterial vaginosis, syphilis and HIV infections. Treating sexual partners will prevent you from being re-infected and help stop the spread of STD infection to others. Although it is best to see a specialist for STD/HIV testing and counseling, this is not always possible. Some states/provinces permit something called "expedited partner therapy." This kind of program permits you to deliver prescription(s) to a partner without the partner having to seek a formal medical exam.  HOME CARE INSTRUCTIONS   Take all prescribed medication.   If applicable, speak to your partner about recommended treatment.   Do not have sexual intercourse for one week, or as directed by your caregiver.   Practice safe sex.   Use condoms.   Have only one sex partner.   Make sure your sex partner does not have any other sex partners.   Avoid tight pants and panty hose.   Wear cotton underwear.   Do not douche.   Avoid tampons, especially scented ones.   Take warm sitz baths.   Avoid vaginal sprays and perfumed soaps or bath oils.   Apply medicated cream (steroid cream) for itching or irritation with the permission of your caregiver.  SEEK MEDICAL CARE IF:   You have any kind of abnormal vaginal discharge.   Your sex partner has a genital infection.   You have pain or bleeding with sexual intercourse.   You have itching, pain, irritation or bleeding of the vulva.  SEEK IMMEDIATE MEDICAL CARE IF:   You have an oral  temperature above 102 F (38.9 C), not controlled by medicine.   You have belly (abdominal) pain.   Symptoms do not improve within 3 days or as directed.   You develop painful or bloody urine.   You develop rectal pain, bleeding or discharge.  Document Released: 11/13/2004 Document Revised: 01/23/2011 Document Reviewed: 06/29/2008 [facility] Patient Information 2012 [place], [place].  Trichomoniasis Trichomoniasis is an infection, caused by the Trichomonas organism, that affects both women and men. In women, the outer female genitalia and the vagina are affected. In men, the penis is mainly affected, but the prostate and other reproductive organs can also be involved. Trichomoniasis is a sexually transmitted disease (STD) and is most often passed to another person through sexual contact. The majority of people who get trichomoniasis do so from a sexual encounter and are also at risk for other STDs. CAUSES   Sexual intercourse with an infected partner.   It can be  present in swimming pools or hot tubs.  SYMPTOMS   Abnormal gray-green frothy vaginal discharge in women.   Vaginal itching and irritation in women.   Itching and irritation of the area outside the vagina in women.   Penile discharge with or without pain in males.   Inflammation of the urethra (urethritis), causing painful urination.   Bleeding after sexual intercourse.  RELATED COMPLICATIONS  Pelvic inflammatory disease.   Infection of the uterus (endometritis).   Infertility.   Tubal (ectopic) pregnancy.   It can be associated with other STDs, including gonorrhea and chlamydia, hepatitis B, and HIV.  COMPLICATIONS DURING PREGNANCY  Early (premature) delivery.   Premature rupture of the membranes (PROM).   Low birth weight.  DIAGNOSIS   Visualization of Trichomonas under the microscope from the vagina discharge.   Ph of the vagina greater than 4.5, tested with a test tape.   Trich Rapid Test.    Culture of the organism, but this is not usually needed.   It may be found on a Pap test.   Having a "strawberry cervix,"which means the cervix looks very red like a strawberry.  TREATMENT   You may be given medication to fight the infection. Inform your caregiver if you could be or are pregnant. Some medications used to treat the infection should not be taken during pregnancy.   Over-the-counter medications or creams to decrease itching or irritation may be recommended.   Your sexual partner will need to be treated if infected.  HOME CARE INSTRUCTIONS   Take all medication prescribed by your caregiver.   Take over-the-counter medication for itching or irritation as directed by your caregiver.   Do not have sexual intercourse while you have the infection.   Do not douche or wear tampons.   Discuss your infection with your partner, as your partner may have acquired the infection from you. Or, your partner may have been the person who transmitted the infection to you.   Have your sex partner examined and treated if necessary.   Practice safe, informed, and protected sex.   See your caregiver for other STD testing.  SEEK MEDICAL CARE IF:   You still have symptoms after you finish the medication.   You have an oral temperature above 102 F (38.9 C).   You develop belly (abdominal) pain.   You have pain when you urinate.   You have bleeding after sexual intercourse.   You develop a rash.   The medication makes you sick or makes you throw up (vomit).  Document Released: 07/30/2000 Document Revised: 01/23/2011 Document Reviewed: 08/25/2008 [facility] Patient Information 2012 [place], [place].

## 2011-05-27 NOTE — ED Notes (Signed)
Patient reports she has a lot of pain in her lower abd.  She has been seen by her md several mths ago for possible kidney/bladder infection.  Her sx did not resolve after tx.  Patient states she has had nausea and more pain.  Patient has hx of diverticulitis, polyps and ovarian cyst.  Patient reports intermittent diarrhea.  She states she has intermittent severe pain.  Patient states denies blood in her stools. Patient has had nausea but no vomitting.  Patient denies fever at present but states last night she had feeling that she had a fever.  Patient states she feels puffy in her abd

## 2011-05-27 NOTE — ED Notes (Signed)
Patient transported to CT 

## 2011-05-27 NOTE — ED Notes (Signed)
Report given to Drinda Butts, Charity fundraiser. Pt moved to CDU 11

## 2011-05-28 LAB — GC/CHLAMYDIA PROBE AMP, GENITAL
Chlamydia, DNA Probe: NEGATIVE
GC Probe Amp, Genital: NEGATIVE

## 2011-05-28 NOTE — ED Provider Notes (Signed)
Medical screening examination/treatment/procedure(s) were performed by non-physician practitioner and as supervising physician I was immediately available for consultation/collaboration.  Matheu Ploeger T Sherrel Shafer, MD 05/28/11 0746 

## 2011-12-21 ENCOUNTER — Encounter (HOSPITAL_COMMUNITY): Payer: Self-pay | Admitting: Emergency Medicine

## 2011-12-21 ENCOUNTER — Emergency Department (HOSPITAL_COMMUNITY)
Admission: EM | Admit: 2011-12-21 | Discharge: 2011-12-21 | Disposition: A | Discharge status: Home / Self Care | Payer: BC Managed Care – PPO | Attending: Emergency Medicine | Admitting: Emergency Medicine

## 2011-12-21 DIAGNOSIS — M549 Dorsalgia, unspecified: Secondary | ICD-10-CM | POA: Insufficient documentation

## 2011-12-21 DIAGNOSIS — F17200 Nicotine dependence, unspecified, uncomplicated: Secondary | ICD-10-CM | POA: Insufficient documentation

## 2011-12-21 DIAGNOSIS — G8929 Other chronic pain: Secondary | ICD-10-CM | POA: Insufficient documentation

## 2011-12-21 DIAGNOSIS — R109 Unspecified abdominal pain: Secondary | ICD-10-CM | POA: Insufficient documentation

## 2011-12-21 DIAGNOSIS — R945 Abnormal results of liver function studies: Secondary | ICD-10-CM | POA: Insufficient documentation

## 2011-12-21 DIAGNOSIS — Z8719 Personal history of other diseases of the digestive system: Secondary | ICD-10-CM | POA: Insufficient documentation

## 2011-12-21 DIAGNOSIS — R300 Dysuria: Secondary | ICD-10-CM | POA: Insufficient documentation

## 2011-12-21 DIAGNOSIS — Z8742 Personal history of other diseases of the female genital tract: Secondary | ICD-10-CM | POA: Insufficient documentation

## 2011-12-21 DIAGNOSIS — R3989 Other symptoms and signs involving the genitourinary system: Secondary | ICD-10-CM | POA: Insufficient documentation

## 2011-12-21 LAB — WET PREP, GENITAL
Trich, Wet Prep: NONE SEEN
Yeast Wet Prep HPF POC: NONE SEEN

## 2011-12-21 LAB — CBC WITH DIFFERENTIAL/PLATELET
Basophils Absolute: 0 10*3/uL (ref 0.0–0.1)
Basophils Relative: 0 % (ref 0–1)
Eosinophils Absolute: 0.1 10*3/uL (ref 0.0–0.7)
Eosinophils Relative: 1 % (ref 0–5)
HCT: 41.1 % (ref 36.0–46.0)
Hemoglobin: 13.8 g/dL (ref 12.0–15.0)
Lymphocytes Relative: 29 % (ref 12–46)
Lymphs Abs: 1.9 10*3/uL (ref 0.7–4.0)
MCH: 30.7 pg (ref 26.0–34.0)
MCHC: 33.6 g/dL (ref 30.0–36.0)
MCV: 91.5 fL (ref 78.0–100.0)
Monocytes Absolute: 0.7 10*3/uL (ref 0.1–1.0)
Monocytes Relative: 10 % (ref 3–12)
Neutro Abs: 4 10*3/uL (ref 1.7–7.7)
Neutrophils Relative %: 60 % (ref 43–77)
Platelets: 247 10*3/uL (ref 150–400)
RBC: 4.49 MIL/uL (ref 3.87–5.11)
RDW: 11.5 % (ref 11.5–15.5)
WBC: 6.7 10*3/uL (ref 4.0–10.5)

## 2011-12-21 LAB — URINALYSIS, ROUTINE W REFLEX MICROSCOPIC
Bilirubin Urine: NEGATIVE
Glucose, UA: NEGATIVE mg/dL
Hgb urine dipstick: NEGATIVE
Ketones, ur: NEGATIVE mg/dL
Leukocytes, UA: NEGATIVE
Nitrite: NEGATIVE
Protein, ur: NEGATIVE mg/dL
Specific Gravity, Urine: 1.026 (ref 1.005–1.030)
Urobilinogen, UA: 0.2 mg/dL (ref 0.0–1.0)
pH: 6.5 (ref 5.0–8.0)

## 2011-12-21 LAB — PREGNANCY, URINE: Preg Test, Ur: NEGATIVE

## 2011-12-21 LAB — BASIC METABOLIC PANEL
BUN: 18 mg/dL (ref 6–23)
CO2: 31 mEq/L (ref 19–32)
Calcium: 9.3 mg/dL (ref 8.4–10.5)
Chloride: 106 mEq/L (ref 96–112)
Creatinine, Ser: 0.9 mg/dL (ref 0.50–1.10)
GFR calc Af Amer: 86 mL/min — ABNORMAL LOW (ref >90)
GFR calc non Af Amer: 74 mL/min — ABNORMAL LOW (ref >90)
Glucose, Bld: 95 mg/dL (ref 70–99)
Potassium: 4.2 mEq/L (ref 3.5–5.1)
Sodium: 142 mEq/L (ref 135–145)

## 2011-12-21 MED ORDER — ONDANSETRON HCL 4 MG/2ML IJ SOLN
4.0000 mg | Freq: Once | INTRAMUSCULAR | Status: AC
  Administered 2011-12-21: 4 mg via INTRAVENOUS
  Filled 2011-12-21: qty 2

## 2011-12-21 MED ORDER — MORPHINE SULFATE 4 MG/ML IJ SOLN
4.0000 mg | Freq: Once | INTRAMUSCULAR | Status: AC
  Administered 2011-12-21: 4 mg via INTRAVENOUS
  Filled 2011-12-21: qty 1

## 2011-12-21 MED ORDER — TRAMADOL HCL 50 MG PO TABS: 50.0000 mg | ORAL_TABLET | Freq: Four times a day (QID) | ORAL | Status: DC | PRN

## 2011-12-21 MED ORDER — ONDANSETRON HCL 4 MG PO TABS: 4.0000 mg | ORAL_TABLET | Freq: Four times a day (QID) | ORAL | Status: DC

## 2011-12-21 NOTE — ED Provider Notes (Signed)
Medical screening examination/treatment/procedure(s) were conducted as a shared visit with non-physician practitioner(s) and myself.  I personally evaluated the patient during the encounter  Lonzy Mato, MD 12/21/11 1656 

## 2011-12-21 NOTE — ED Provider Notes (Signed)
History     CSN: [id number]  Arrival date & time 12/21/11  1011   First MD Initiated Contact with Patient 12/21/11 1033      Chief Complaint  Patient presents with  . Abdominal Pain  . Back Pain    (Consider location/radiation/quality/duration/timing/severity/associated sxs/prior treatment) HPI  Pt to the ER with complaints of suprapubic abdominal pains. Her pains have been on and off for 4 months. She tells me that last time she was here they said she has Trichomonas and gave her treatment for it. She no longer has periods, its been 3 years since her last one. She has had no irregular discharge, dyspareunia. She admits to Dysuria and foul smelling urine "for weeks... Or maybe months".  The patient declines wanting pain medication at this time.   She has a second complaint which is that her mattress is uncomfortable and it causes her shoulder blades to hurt. She would like pain medication for that.  The patient also, would like me to evaluate her liver as she has a history of liver cancer and no longer has a gallbladder. NO RUQ pains, weight loss, vomiting or nausea.   Past Medical History  Diagnosis Date  . Diverticula of colon   . Ovarian cyst     Past Surgical History  Procedure Date  . Cholecystectomy   . Appendectomy   . Tubal ligation   . Kidney stone surgery     No family history on file.  History  Substance Use Topics  . Smoking status: Current Every Day Smoker  . Smokeless tobacco: Not on file  . Alcohol Use: Yes    OB History    Grav Para Term Preterm Abortions TAB SAB Ect Mult Living                  Review of Systems  Review of Systems  Gen: no weight loss, fevers, chills, night sweats  Eyes: no discharge or drainage, no occular pain or visual changes  Nose: no epistaxis or rhinorrhea  Mouth: no dental pain, no sore throat  Neck: no neck pain  Lungs:No wheezing, coughing or hemoptysis CV: no chest pain, palpitations, dependent edema or  orthopnea  Abd: + suprapubic abdominal pain, no nausea, vomiting  GU: no dysuria or gross hematuria  MSK:  No abnormalities  Neuro: no headache, no focal neurologic deficits  Skin: no abnormalities Psyche: negative.   Allergies  Penicillins  Home Medications   Current Outpatient Rx  Name  Route  Sig  Dispense  Refill  . ACETAMINOPHEN 500 MG PO TABS   Oral   Take 1,000 mg by mouth every 6 (six) hours as needed. For pain         . NAPROXEN SODIUM 220 MG PO TABS   Oral   Take 220 mg by mouth as needed. For pain           BP 118/64  Pulse 71  Temp 98.1 F (36.7 C) (Oral)  Resp 16  SpO2 99%  Physical Exam  Nursing note and vitals reviewed. Constitutional: She appears well-developed and well-nourished. No distress.  HENT:  Head: Normocephalic and atraumatic.  Eyes: Pupils are equal, round, and reactive to light.  Neck: Normal range of motion. Neck supple.  Cardiovascular: Normal rate and regular rhythm.   Pulmonary/Chest: Effort normal.  Abdominal: Soft. She exhibits no distension. There is tenderness (mild suprapubic pain). There is no rebound.  Neurological: She is alert.  Skin: Skin is warm and dry.  ED Course  Procedures (including critical care time)  Labs Reviewed  URINALYSIS, ROUTINE W REFLEX MICROSCOPIC - Abnormal; Notable for the following:    APPearance CLOUDY (*)     All other components within normal limits  BASIC METABOLIC PANEL - Abnormal; Notable for the following:    GFR calc non Af Amer 74 (*)     GFR calc Af Amer 86 (*)     All other components within normal limits  WET PREP, GENITAL - Abnormal; Notable for the following:    Clue Cells Wet Prep HPF POC FEW (*)     WBC, Wet Prep HPF POC RARE (*)     All other components within normal limits  CBC WITH DIFFERENTIAL  PREGNANCY, URINE  GC/CHLAMYDIA PROBE AMP, GENITAL   No results found.   1. Chronic abdominal pain       MDM  Labs are unremarkable. Urine and wet prep also  unremarkable. Dr. Ethelda Chick evalauted patient as well and we do not feel as though she needs any imaging at this time. Pt would like to be screened for liver and cervical cancer. These are things that need to be managed by PCP.  Will dc with pain and nausea meds.  Pt has been advised of the symptoms that warrant their return to the ED. Patient has voiced understanding and has agreed to follow-up with the PCP or specialist.          Dorthula Matas, PA 12/21/11 1254

## 2011-12-21 NOTE — ED Provider Notes (Signed)
Patient complains of suprapubic pain intermittently for several months. She also complained of pain at her upper back since last night worse with changing position or moving denies shortness of breath. She feels improved since treatment in the emergency department on exam patient in no distress lungs clear auscultation heart regular rate and rhythm abdomen minimally tender to suprapubic area. Patient's concerned about "spot on my liver" seen on CT scan several months ago for which she was referred to her primary care Dr. Dr. Collins Scotland. I reviewed the CT scan from April 2013 with radiologist liver lesion felt to be benign and not felt needs emergent followup. I stressed to the patient that she should follow up with Dr. Collins Scotland as an outpatient  Doug Sou, MD 12/21/11 [phone]

## 2012-05-20 ENCOUNTER — Emergency Department (HOSPITAL_COMMUNITY)
Admission: EM | Admit: 2012-05-20 | Discharge: 2012-05-20 | Disposition: A | Discharge status: Home / Self Care | Payer: BC Managed Care – PPO | Attending: Emergency Medicine | Admitting: Emergency Medicine

## 2012-05-20 ENCOUNTER — Emergency Department (HOSPITAL_COMMUNITY): Payer: BC Managed Care – PPO

## 2012-05-20 ENCOUNTER — Encounter (HOSPITAL_COMMUNITY): Payer: Self-pay | Admitting: *Deleted

## 2012-05-20 ENCOUNTER — Other Ambulatory Visit: Payer: Self-pay

## 2012-05-20 DIAGNOSIS — R51 Headache: Secondary | ICD-10-CM | POA: Insufficient documentation

## 2012-05-20 DIAGNOSIS — R110 Nausea: Secondary | ICD-10-CM | POA: Insufficient documentation

## 2012-05-20 DIAGNOSIS — L299 Pruritus, unspecified: Secondary | ICD-10-CM | POA: Insufficient documentation

## 2012-05-20 DIAGNOSIS — F17200 Nicotine dependence, unspecified, uncomplicated: Secondary | ICD-10-CM | POA: Insufficient documentation

## 2012-05-20 DIAGNOSIS — Z8719 Personal history of other diseases of the digestive system: Secondary | ICD-10-CM | POA: Insufficient documentation

## 2012-05-20 DIAGNOSIS — R21 Rash and other nonspecific skin eruption: Secondary | ICD-10-CM | POA: Insufficient documentation

## 2012-05-20 DIAGNOSIS — Z8742 Personal history of other diseases of the female genital tract: Secondary | ICD-10-CM | POA: Insufficient documentation

## 2012-05-20 DIAGNOSIS — R079 Chest pain, unspecified: Secondary | ICD-10-CM | POA: Insufficient documentation

## 2012-05-20 DIAGNOSIS — Z87442 Personal history of urinary calculi: Secondary | ICD-10-CM | POA: Insufficient documentation

## 2012-05-20 HISTORY — DX: Disorder of kidney and ureter, unspecified: N28.9

## 2012-05-20 LAB — POCT I-STAT, CHEM 8
BUN: 9 mg/dL (ref 6–23)
Calcium, Ion: 1.17 mmol/L (ref 1.12–1.23)
Chloride: 103 mEq/L (ref 96–112)
Creatinine, Ser: 0.8 mg/dL (ref 0.50–1.10)
Glucose, Bld: 111 mg/dL — ABNORMAL HIGH (ref 70–99)
HCT: 42 % (ref 36.0–46.0)
Hemoglobin: 14.3 g/dL (ref 12.0–15.0)
Potassium: 3.9 mEq/L (ref 3.5–5.1)
Sodium: 141 mEq/L (ref 135–145)
TCO2: 27 mmol/L (ref 0–100)

## 2012-05-20 LAB — CBC
HCT: 41.1 % (ref 36.0–46.0)
Hemoglobin: 14.2 g/dL (ref 12.0–15.0)
MCH: 30.9 pg (ref 26.0–34.0)
MCHC: 34.5 g/dL (ref 30.0–36.0)
MCV: 89.3 fL (ref 78.0–100.0)
Platelets: 269 10*3/uL (ref 150–400)
RBC: 4.6 MIL/uL (ref 3.87–5.11)
RDW: 11.7 % (ref 11.5–15.5)
WBC: 8.4 10*3/uL (ref 4.0–10.5)

## 2012-05-20 LAB — POCT I-STAT TROPONIN I
Troponin i, poc: 0 ng/mL (ref 0.00–0.08)
Troponin i, poc: 0.01 ng/mL (ref 0.00–0.08)

## 2012-05-20 MED ORDER — ONDANSETRON 4 MG PO TBDP
4.0000 mg | ORAL_TABLET | Freq: Once | ORAL | Status: AC
  Administered 2012-05-20: 4 mg via ORAL
  Filled 2012-05-20: qty 1

## 2012-05-20 MED ORDER — IBUPROFEN 800 MG PO TABS
800.0000 mg | ORAL_TABLET | Freq: Once | ORAL | Status: AC
  Administered 2012-05-20: 800 mg via ORAL
  Filled 2012-05-20: qty 1

## 2012-05-20 MED ORDER — OXYCODONE-ACETAMINOPHEN 5-325 MG PO TABS
1.0000 | ORAL_TABLET | Freq: Once | ORAL | Status: AC
  Administered 2012-05-20: 1 via ORAL
  Filled 2012-05-20: qty 1

## 2012-05-20 NOTE — ED Notes (Signed)
Pt c/o shooting pain to L breast, nausea, headache staring this am.  She left work d/t feeling ill, called pcp and was told to come here.

## 2012-05-20 NOTE — ED Provider Notes (Signed)
History     CSN: [id number]  Arrival date & time 05/20/12  1455   First MD Initiated Contact with Patient 05/20/12 1639      Chief Complaint  Patient presents with  . Chest Pain    (Consider location/radiation/quality/duration/timing/severity/associated sxs/prior treatment) HPI Comments: Pt presents to the ED for exertional L sided chest pain while working earlier today.  Pain described as intermittent, sharp, and radiating to her left breast associated with nausea.  Prior episodes of this chest pain but has never formally been evaluated.  Nothing seems to make the pain better or worse.  Pt also notes a small rash between her breasts that she just noticed this morning.  States it is pruritic but not painful.  Denies change in laundry detergent, soap, shampoo, or other personal care products but was doing yard work yesterday and was sweating a lot.  Also notes a mild headache that is not different from her usual headache.  Not associated with photophobia, phonophobia, or aura.  Denies SOB, dizziness, blurred vision, or confusion.  Pt is a daily smoker.  No hx of HTN or HLP.  Has never seen cardiology.  The history is provided by the patient.    Past Medical History  Diagnosis Date  . Diverticula of colon   . Ovarian cyst   . Renal disorder     kidney stones    Past Surgical History  Procedure Laterality Date  . Cholecystectomy    . Appendectomy    . Tubal ligation    . Kidney stone surgery      No family history on file.  History  Substance Use Topics  . Smoking status: Current Every Day Smoker  . Smokeless tobacco: Not on file  . Alcohol Use: Yes    OB History   Grav Para Term Preterm Abortions TAB SAB Ect Mult Living                  Review of Systems  Cardiovascular: Positive for chest pain.  Gastrointestinal: Positive for nausea.  Neurological: Positive for headaches.  All other systems reviewed and are negative.    Allergies  Penicillins  Home  Medications   Current Outpatient Rx  Name  Route  Sig  Dispense  Refill  . acetaminophen (TYLENOL) 500 MG tablet   Oral   Take 1,000 mg by mouth every 6 (six) hours as needed. For pain         . naproxen sodium (ANAPROX) 220 MG tablet   Oral   Take 220 mg by mouth as needed. For pain         . ondansetron (ZOFRAN) 4 MG tablet   Oral   Take 1 tablet (4 mg total) by mouth every 6 (six) hours.   12 tablet   0   . traMADol (ULTRAM) 50 MG tablet   Oral   Take 1 tablet (50 mg total) by mouth every 6 (six) hours as needed for pain.   15 tablet   0     BP 119/67  Pulse 79  Temp(Src) 98.5 F (36.9 C) (Oral)  Resp 20  Ht 5\' 6"  (1.676 m)  Wt 150 lb (68.04 kg)  BMI 24.22 kg/m2  SpO2 96%  Physical Exam  Nursing note and vitals reviewed. Constitutional: She is oriented to person, place, and time. She appears well-developed and well-nourished.  HENT:  Head: Normocephalic and atraumatic.  Mouth/Throat: Oropharynx is clear and moist.  Eyes: Conjunctivae and EOM are normal. Pupils  are equal, round, and reactive to light.  Neck: Normal range of motion.  Cardiovascular: Normal rate, regular rhythm and normal heart sounds.   Pulmonary/Chest: Effort normal and breath sounds normal. She has no wheezes.  Chest pain not reproducible with palpation  Abdominal: Soft. Bowel sounds are normal. There is no tenderness. There is no guarding.  Musculoskeletal: Normal range of motion.  Neurological: She is alert and oriented to person, place, and time. She has normal strength. No cranial nerve deficit or sensory deficit.  Skin: Skin is warm and dry. Rash noted. Rash is not macular, not pustular, not vesicular and not urticarial.  Small rash between breasts, pruritic but not painful  Psychiatric: She has a normal mood and affect.    ED Course  Procedures (including critical care time)   Date: 05/20/2012  Rate: 82  Rhythm: normal sinus rhythm  QRS Axis: normal  Intervals: normal  ST/T  Wave abnormalities: normal  Conduction Disutrbances:none  Narrative Interpretation:   Old EKG Reviewed: none available    Labs Reviewed  POCT I-STAT, CHEM 8 - Abnormal; Notable for the following:    Glucose, Bld 111 (*)    All other components within normal limits  CBC  POCT I-STAT TROPONIN I  POCT I-STAT TROPONIN I   Dg Chest 2 View  05/20/2012  *RADIOLOGY REPORT*  Clinical Data: Chest pain  CHEST - 2 VIEW  Comparison: April 01, 2008.  Findings: Cardiomediastinal silhouette appears normal.  No acute pulmonary disease is noted.  Bony thorax is intact.  IMPRESSION: No acute cardiopulmonary abnormality seen.   Original Report Authenticated By: Lupita Raider.,  M.D.      1. Exertional chest pain       MDM   Cardiac workup including including trop x2 negative.  Pt remained asx while in the ED.  Discussed with cardiology- Dr. Diona Browner.  Pt appropriate for OP FU with cardiology.  She will see her PCP for referral.  Discussed plan with pt- she agreed.  Return precautions advised.        Garlon Hatchet, PA-C 05/21/12 1235

## 2012-05-24 NOTE — ED Provider Notes (Signed)
Medical screening examination/treatment/procedure(s) were performed by non-physician practitioner and as supervising physician I was immediately available for consultation/collaboration.  Mandrell Vangilder J. Sricharan Lacomb, MD 05/24/12 2341 

## 2012-06-22 ENCOUNTER — Encounter: Payer: Self-pay | Admitting: Cardiology

## 2012-06-22 ENCOUNTER — Ambulatory Visit (INDEPENDENT_AMBULATORY_CARE_PROVIDER_SITE_OTHER): Payer: BC Managed Care – PPO | Admitting: Cardiology

## 2012-06-22 VITALS — BP 115/74 | HR 57 | Ht 66.0 in | Wt 157.4 lb

## 2012-06-22 DIAGNOSIS — R079 Chest pain, unspecified: Secondary | ICD-10-CM

## 2012-06-22 NOTE — Progress Notes (Signed)
HPI The patient presents for followup after recent ER visit for chest discomfort. I reviewed his records. She has some atypical chest pain with negative cardiac enzymes. This was in April. She gets this discomfort sporadically. However, it has been worse recently. She denies any ongoing symptoms such as substernal pressure neck or arm discomfort. When she does get the discomfort is a left upper chest discomfort without radiation. It was severe when she presented has not been since then. She might stenosis emotional rest but not with physical activity and she does some physical things at work. She denies any palpitations, presyncope or syncope. She does not describe PND or orthopnea. She has had no weight gain or edema.  Allergies  Allergen Reactions  . Penicillins Shortness Of Breath and Swelling    Current Outpatient Prescriptions  Medication Sig Dispense Refill  . naproxen sodium (ANAPROX) 220 MG tablet Take 220 mg by mouth daily as needed. For pain      . ondansetron (ZOFRAN) 4 MG tablet Take 4 mg by mouth every 6 (six) hours. For nausea       No current facility-administered medications for this visit.    Past Medical History  Diagnosis Date  . Diverticula of colon   . Ovarian cyst   . Renal disorder     kidney stones    Past Surgical History  Procedure Laterality Date  . Cholecystectomy    . Appendectomy    . Tubal ligation    . Kidney stone surgery      No family history on file.  History   Social History  . Marital Status: Married    Spouse Name: N/A    Number of Children: N/A  . Years of Education: N/A   Occupational History  . Not on file.   Social History Main Topics  . Smoking status: Current Every Day Smoker  . Smokeless tobacco: Not on file  . Alcohol Use: Yes  . Drug Use: No  . Sexually Active: Not on file   Other Topics Concern  . Not on file   Social History Narrative  . No narrative on file    ROS:  Positive for headaches.  Otherwise as  stated in the HPI and negative for all other systems.   PHYSICAL EXAM BP 115/74  Pulse 57  Ht 5\' 6"  (1.676 m)  Wt 157 lb 6.4 oz (71.396 kg)  BMI 25.42 kg/m2 GENERAL:  Well appearing HEENT:  Pupils equal round and reactive, fundi not visualized, oral mucosa unremarkable NECK:  No jugular venous distention, waveform within normal limits, carotid upstroke brisk and symmetric, no bruits, no thyromegaly LYMPHATICS:  No cervical, inguinal adenopathy LUNGS:  Clear to auscultation bilaterally BACK:  No CVA tenderness CHEST:  Unremarkable HEART:  PMI not displaced or sustained,S1 and S2 within normal limits, no S3, no S4, no clicks, no rubs, no murmurs ABD:  Flat, positive bowel sounds normal in frequency in pitch, no bruits, no rebound, no guarding, no midline pulsatile mass, no hepatomegaly, no splenomegaly EXT:  2 plus pulses throughout, no edema, no cyanosis no clubbing SKIN:  No rashes no nodules NEURO:  Cranial nerves II through XII grossly intact, motor grossly intact throughout PSYCH:  Cognitively intact, oriented to person place and time  EKG:  05/20/12.   Sinus rhythm, rate 82, axis within normal limits, intervals within normal limits, no acute ST-T wave changes.  ASSESSMENT AND PLAN  CHEST PAIN:  Her pain is atypical but she does have cardiovascular  risk factors. I will bring the patient back for a POET (Plain Old Exercise Test). This will allow me to screen for obstructive coronary disease, risk stratify and very importantly provide a prescription for exercise.  I will defer to Herb Grays, MD followup of other risk factors such as a lipid profile but I would have a low threshold for treatment given her family history and smoking history.  TOBACCO:  We discussed a specific strategy for tobacco cessation.  (Greater than three minutes discussing tobacco cessation.)  She will let me know an upcoming appointment if she wants Chantix.

## 2012-06-22 NOTE — Patient Instructions (Addendum)
The current medical regimen is effective;  continue present plan and medications.  Your physician has requested that you have an exercise tolerance test. For further information please visit [email or web address]. Please also follow instruction sheet, as given.   

## 2012-07-15 ENCOUNTER — Encounter: Payer: BC Managed Care – PPO | Admitting: Physician Assistant

## 2012-07-19 ENCOUNTER — Telehealth: Payer: Self-pay | Admitting: *Deleted

## 2012-07-19 NOTE — Telephone Encounter (Signed)
Carol [name] was scheduled for treadmill 07/15/12 @ 9:30. Patient cancelled because she was going out of town. Spoke to Carol [name] this morning and she will call me back later in the week to reschedule. I will forward message to [facility].

## 2012-07-22 ENCOUNTER — Encounter: Payer: Self-pay | Admitting: *Deleted

## 2013-08-01 DIAGNOSIS — R202 Paresthesia of skin: Secondary | ICD-10-CM

## 2013-08-01 DIAGNOSIS — G8929 Other chronic pain: Secondary | ICD-10-CM | POA: Insufficient documentation

## 2013-08-01 DIAGNOSIS — M542 Cervicalgia: Secondary | ICD-10-CM

## 2013-08-01 DIAGNOSIS — R200 Anesthesia of skin: Secondary | ICD-10-CM | POA: Insufficient documentation

## 2013-11-17 ENCOUNTER — Emergency Department (HOSPITAL_COMMUNITY): Payer: BC Managed Care – PPO

## 2013-11-17 ENCOUNTER — Emergency Department (HOSPITAL_COMMUNITY)
Admission: EM | Admit: 2013-11-17 | Discharge: 2013-11-17 | Disposition: A | Discharge status: Home / Self Care | Payer: BC Managed Care – PPO | Attending: Emergency Medicine | Admitting: Emergency Medicine

## 2013-11-17 ENCOUNTER — Encounter (HOSPITAL_COMMUNITY): Payer: Self-pay | Admitting: Emergency Medicine

## 2013-11-17 DIAGNOSIS — Z880 Allergy status to penicillin: Secondary | ICD-10-CM | POA: Diagnosis not present

## 2013-11-17 DIAGNOSIS — Z791 Long term (current) use of non-steroidal anti-inflammatories (NSAID): Secondary | ICD-10-CM | POA: Insufficient documentation

## 2013-11-17 DIAGNOSIS — M7712 Lateral epicondylitis, left elbow: Secondary | ICD-10-CM | POA: Insufficient documentation

## 2013-11-17 DIAGNOSIS — Z87442 Personal history of urinary calculi: Secondary | ICD-10-CM | POA: Diagnosis not present

## 2013-11-17 DIAGNOSIS — R202 Paresthesia of skin: Secondary | ICD-10-CM | POA: Diagnosis not present

## 2013-11-17 DIAGNOSIS — Z8719 Personal history of other diseases of the digestive system: Secondary | ICD-10-CM | POA: Diagnosis not present

## 2013-11-17 DIAGNOSIS — M25522 Pain in left elbow: Secondary | ICD-10-CM | POA: Diagnosis present

## 2013-11-17 DIAGNOSIS — M7702 Medial epicondylitis, left elbow: Secondary | ICD-10-CM

## 2013-11-17 DIAGNOSIS — Z720 Tobacco use: Secondary | ICD-10-CM | POA: Diagnosis not present

## 2013-11-17 DIAGNOSIS — Z8742 Personal history of other diseases of the female genital tract: Secondary | ICD-10-CM | POA: Insufficient documentation

## 2013-11-17 LAB — I-STAT TROPONIN, ED: Troponin i, poc: 0 ng/mL (ref 0.00–0.08)

## 2013-11-17 MED ORDER — IBUPROFEN 800 MG PO TABS
800.0000 mg | ORAL_TABLET | Freq: Once | ORAL | Status: AC
  Administered 2013-11-17: 800 mg via ORAL
  Filled 2013-11-17: qty 1

## 2013-11-17 MED ORDER — HYDROCODONE-ACETAMINOPHEN 5-325 MG PO TABS: 2.0000 | ORAL_TABLET | ORAL | Status: DC | PRN

## 2013-11-17 MED ORDER — IBUPROFEN 800 MG PO TABS: 800.0000 mg | ORAL_TABLET | Freq: Three times a day (TID) | ORAL | Status: DC

## 2013-11-17 NOTE — ED Notes (Signed)
Pt reports left elbow pain x several weeks. No definitive injury. Pain radiates to left wrist at times.

## 2013-11-17 NOTE — ED Notes (Addendum)
Pt reports elbow pain for several weeks. States it radiates into left wrist, neck, and back. Pt reports tender to touch elbow. Describes pain as sore, achy, and burning. Pt states hands become numb and tingly with movement of elbow. Pt reports difficulty with movement. Radial pulses +2 in both arms. Pt states ibuprofen never relieves the pain. Pt states she had chest pain a few weeks ago but that went away and has not returned.

## 2013-11-17 NOTE — Discharge Instructions (Signed)
Medial Epicondylitis (Golfer's Elbow) with Rehab Use the sling as needed. Follow up with Dr. Luna Glasgow.  Use the medications as prescribed. Return to the ED if you develop new or worsening symptoms. Medial epicondylitis involves inflammation and pain around the inner (medial) portion of the elbow. This pain is caused by inflammation of the tendons in the forearm that flex (bring down) the wrist. Medial epicondylitis is also called golfer's elbow, because it is common among golfers. However, it may occur in any individual who flexes the wrist regularly. If medial epicondylitis is left untreated, it may become a chronic problem. SYMPTOMS   Pain, tenderness, or inflammation over the inner (medial) side of the elbow.  Pain or weakness with gripping activities.  Pain that increases with wrist twisting motions (using a screwdriver, playing golf, bowling). CAUSES  Medial epicondylitis is caused by inflammation of the tendons that flex the wrist. Causes of injury may include:  Chronic, repetitive stress and strain to the tendons that run from the wrist and forearm to the elbow.  Sudden strain on the forearm, including wrist snap when serving balls with racquet sports, or throwing a baseball. RISK INCREASES WITH:  Sports or occupations that require repetitive and/or strenuous forearm and wrist movements (pitching a baseball, golfing, carpentry).  Poor wrist and forearm strength and flexibility.  Failure to warm up properly before activity.  Resuming activity before healing, rehabilitation, and conditioning are complete. PREVENTION   Warm up and stretch properly before activity.  Maintain physical fitness:  Strength, flexibility, and endurance.  Cardiovascular fitness.  Wear and use properly fitted equipment.  Learn and use proper technique and have a coach correct improper technique.  Wear a tennis elbow (counterforce) brace. PROGNOSIS  The course of this condition depends on the  degree of the injury. If treated properly, acute cases (symptoms lasting less than 4 weeks) are often resolved in 2 to 6 weeks. Chronic (longer lasting cases) often resolve in 3 to 6 months, but may require physical therapy. RELATED COMPLICATIONS   Frequently recurring symptoms, resulting in a chronic problem. Properly treating the problem the first time decreases frequency of recurrence.  Chronic inflammation, scarring, and partial tendon tear, requiring surgery.  Delayed healing or resolution of symptoms. TREATMENT  Treatment first involves the use of ice and medicine, to reduce pain and inflammation. Strengthening and stretching exercises may reduce discomfort, if performed regularly. These exercises may be performed at home, if the condition is an acute injury. Chronic cases may require a referral to a physical therapist for evaluation and treatment. Your caregiver may advise a corticosteroid injection to help reduce inflammation. Rarely, surgery is needed. MEDICATION  If pain medicine is needed, nonsteroidal anti-inflammatory medicines (aspirin and ibuprofen), or other minor pain relievers (acetaminophen), are often advised.  Do not take pain medicine for 7 days before surgery.  Prescription pain relievers may be given, if your caregiver thinks they are needed. Use only as directed and only as much as you need.  Corticosteroid injections may be recommended. These injections should be reserved only for the most severe cases, because they can only be given a certain number of times. HEAT AND COLD  Cold treatment (icing) should be applied for 10 to 15 minutes every 2 to 3 hours for inflammation and pain, and immediately after activity that aggravates your symptoms. Use ice packs or an ice massage.  Heat treatment may be used before performing stretching and strengthening activities prescribed by your caregiver, physical therapist, or athletic trainer. Use a heat  pack or a warm water  soak. SEEK MEDICAL CARE IF: Symptoms get worse or do not improve in 2 weeks, despite treatment. EXERCISES  RANGE OF MOTION (ROM) AND STRETCHING EXERCISES - Epicondylitis, Medial (Golfer's Elbow) These exercises may help you when beginning to rehabilitate your injury. Your symptoms may go away with or without further involvement from your physician, physical therapist or athletic trainer. While completing these exercises, remember:   Restoring tissue flexibility helps normal motion to return to the joints. This allows healthier, less painful movement and activity.  An effective stretch should be held for at least 30 seconds.  A stretch should never be painful. You should only feel a gentle lengthening or release in the stretched tissue. RANGE OF MOTION - Wrist Flexion, Active-Assisted  Extend your right / left elbow with your fingers pointing down.*  Gently pull the back of your hand towards you, until you feel a gentle stretch on the top of your forearm.  Hold this position for __________ seconds. Repeat __________ times. Complete this exercise __________ times per day.  *If directed by your physician, physical therapist or athletic trainer, complete this stretch with your elbow bent, rather than extended. RANGE OF MOTION - Wrist Extension, Active-Assisted  Extend your right / left elbow and turn your palm upwards.*  Gently pull your palm and fingertips back, so your wrist extends and your fingers point more toward the ground.  You should feel a gentle stretch on the inside of your forearm.  Hold this position for __________ seconds. Repeat __________ times. Complete this exercise __________ times per day. *If directed by your physician, physical therapist or athletic trainer, complete this stretch with your elbow bent, rather than extended. STRETCH - Wrist Extension   Place your right / left fingertips on a tabletop leaving your elbow slightly bent. Your fingers should point  backwards.  Gently press your fingers and palm down onto the table, by straightening your elbow. You should feel a stretch on the inside of your forearm.  Hold this position for __________ seconds. Repeat __________ times. Complete this stretch __________ times per day.  STRENGTHENING EXERCISES - Epicondylitis, Medial (Golfer's Elbow) These exercises may help you when beginning to rehabilitate your injury. They may resolve your symptoms with or without further involvement from your physician, physical therapist or athletic trainer. While completing these exercises, remember:   Muscles can gain both the endurance and the strength needed for everyday activities through controlled exercises.  Complete these exercises as instructed by your physician, physical therapist or athletic trainer. Increase the resistance and repetitions only as guided.  You may experience muscle soreness or fatigue, but the pain or discomfort you are trying to eliminate should never worsen during these exercises. If this pain does get worse, stop and make sure you are following the directions exactly. If the pain is still present after adjustments, discontinue the exercise until you can discuss the trouble with your caregiver. STRENGTH - Wrist Flexors  Sit with your right / left forearm palm-up, and fully supported on a table or countertop. Your elbow should be resting below the height of your shoulder. Allow your wrist to extend over the edge of the surface.  Loosely holding a __________ weight, or a piece of rubber exercise band or tubing, slowly curl your hand up toward your forearm.  Hold this position for __________ seconds. Slowly lower the wrist back to the starting position in a controlled manner. Repeat __________ times. Complete this exercise __________ times per day.  STRENGTH - Wrist Extensors  Sit with your right / left forearm palm-down and fully supported. Your elbow should be resting below the height of  your shoulder. Allow your wrist to extend over the edge of the surface.  Loosely holding a __________ weight, or a piece of rubber exercise band or tubing, slowly curl your hand up toward your forearm.  Hold this position for __________ seconds. Slowly lower the wrist back to the starting position in a controlled manner. Repeat __________ times. Complete this exercise __________ times per day.  STRENGTH - Ulnar Deviators  Stand with a ____________________ weight in your right / left hand, or sit while holding a rubber exercise band or tubing, with your healthy arm supported on a table or countertop.  Move your wrist so that your pinkie travels toward your forearm and your thumb moves away from your forearm.  Hold this position for __________ seconds and then slowly lower the wrist back to the starting position. Repeat __________ times. Complete this exercise __________ times per day STRENGTH - Grip   Grasp a tennis ball, a dense sponge, or a large, rolled sock in your hand.  Squeeze as hard as you can, without increasing any pain.  Hold this position for __________ seconds. Release your grip slowly. Repeat __________ times. Complete this exercise __________ times per day.  STRENGTH - Forearm Supinators   Sit with your right / left forearm supported on a table, keeping your elbow below shoulder height. Rest your hand over the edge, palm down.  Gently grip a hammer or a soup ladle.  Without moving your elbow, slowly turn your palm and hand upward to a "thumbs-up" position.  Hold this position for __________ seconds. Slowly return to the starting position. Repeat __________ times. Complete this exercise __________ times per day.  STRENGTH - Forearm Pronators  Sit with your right / left forearm supported on a table, keeping your elbow below shoulder height. Rest your hand over the edge, palm up.  Gently grip a hammer or a soup ladle.  Without moving your elbow, slowly turn your palm  and hand upward to a "thumbs-up" position.  Hold this position for __________ seconds. Slowly return to the starting position. Repeat __________ times. Complete this exercise __________ times per day.  Document Released: 02/03/2005 Document Revised: 04/28/2011 Document Reviewed: 05/18/2008 [facility] Patient Information 2015 [place], [place]. This information is not intended to replace advice given to you by your health care provider. Make sure you discuss any questions you have with your health care provider.

## 2013-11-17 NOTE — ED Notes (Signed)
MD at bedside. 

## 2013-11-17 NOTE — ED Provider Notes (Signed)
CSN: [id number]     Arrival date & time 11/17/13  2229 History  This chart was scribed for Ezequiel Essex, MD by Edison Simon, ED Scribe. This patient was seen in room APA14/APA14 and the patient's care was started at 9:48 AM.    Chief Complaint  Patient presents with  . Elbow Pain   The history is provided by the patient. No language interpreter was used.    HPI Comments: Carol [name] is a 53 y.o. female who presents to the Emergency Department complaining of constant left elbow pain and forearm shooting into her wrist and sometimes  to her shoulder and back with onset several weeks ago. She denies recent injuries to falls. She states nothing seems to help or make it worse. She reports using Ibuprofen without significant improvement in pain. She reports associated tingling to her hand. She states her right arm is not hurting. She works at a [employer] but denies other significant strain to her left arm. She denies chest pain, current back pain, or shoulder pain. She states she has a PCP but has not seen her for these symptoms.  Past Medical History  Diagnosis Date  . Diverticula of colon   . Ovarian cyst   . Renal disorder     kidney stones   Past Surgical History  Procedure Laterality Date  . Cholecystectomy    . Appendectomy    . Tubal ligation    . Kidney stone surgery     Family History  Problem Relation Age of Onset  . Heart disease Father     Pacemaker  . CAD Mother 90    CABG  . Cancer Mother     Died age 1 of colon CA  . Atrial fibrillation Sister    History  Substance Use Topics  . Smoking status: Current Every Day Smoker -- 1.00 packs/day for 35 years    Types: Cigarettes  . Smokeless tobacco: Not on file  . Alcohol Use: Yes   OB History   Grav Para Term Preterm Abortions TAB SAB Ect Mult Living                 Review of Systems  Cardiovascular: Negative for chest pain.  Musculoskeletal: Positive for arthralgias. Negative for back pain.  Neurological:   Tingling   All other systems reviewed and are negative.  A complete 10 system review of systems was obtained and all systems are negative except as noted in the HPI and PMH.    Allergies  Penicillins  Home Medications   Prior to Admission medications   Medication Sig Start Date End Date Taking? Authorizing Provider  naproxen sodium (ANAPROX) 220 MG tablet Take 440 mg by mouth daily as needed (pain). For pain   Yes Historical Provider, MD  HYDROcodone-acetaminophen (NORCO/VICODIN) 5-325 MG per tablet Take 2 tablets by mouth every 4 (four) hours as needed. 11/17/13   Ezequiel Essex, MD  ibuprofen (ADVIL,MOTRIN) 800 MG tablet Take 1 tablet (800 mg total) by mouth 3 (three) times daily. 11/17/13   Ezequiel Essex, MD   BP 118/83  Pulse 63  Temp(Src) 98.7 F (37.1 C) (Oral)  Resp 16  SpO2 100% Physical Exam  Nursing note and vitals reviewed. Constitutional: She is oriented to person, place, and time. She appears well-developed and well-nourished. No distress.  HENT:  Head: Normocephalic and atraumatic.  Mouth/Throat: Oropharynx is clear and moist. No oropharyngeal exudate.  Eyes: Conjunctivae and EOM are normal. Pupils are equal, round, and reactive to  light.  Neck: Normal range of motion. Neck supple.  No meningismus.  Cardiovascular: Normal rate, regular rhythm, normal heart sounds and intact distal pulses.   No murmur heard. Pulmonary/Chest: Effort normal and breath sounds normal. No respiratory distress.  Abdominal: Soft. There is no tenderness. There is no rebound and no guarding.  Musculoskeletal: Normal range of motion. She exhibits tenderness (over left medial epicondyle). She exhibits no edema.  Reduced ROM secondary to pain, no effusion, no erythema, equal grip strength,  Neurological: She is alert and oriented to person, place, and time. No cranial nerve deficit. She exhibits normal muscle tone. Coordination normal.  No ataxia on finger to nose bilaterally. No pronator  drift. 5/5 strength throughout. CN 2-12 intact. Negative Romberg. Equal grip strength. Sensation intact. Gait is normal.   Skin: Skin is warm.  Psychiatric: She has a normal mood and affect. Her behavior is normal.     ED Course  Procedures (including critical care time) [place], ED    Imaging Review Dg Elbow Complete Left  11/17/2013   CLINICAL DATA:  Medial side elbow with pain for approximately 3 weeks; no history of trauma  EXAM: LEFT ELBOW - COMPLETE 3+ VIEW  COMPARISON:  None.  FINDINGS: Frontal, lateral, and bilateral oblique views were obtained. There is no apparent fracture or dislocation. No effusion. Joint spaces appear intact. No erosive change.  IMPRESSION: No abnormality noted.   Electronically Signed   By: Lowella Grip M.D.   On: 11/17/2013 10:19     EKG Interpretation None     COORDINATION OF CARE: 9:52 AM Discussed treatment plan with patient at beside, the patient agrees with the plan and has no further questions at this time.    MDM   Final diagnoses:  Medial epicondylitis of elbow, left   3 week history of atraumatic left elbow pain. Worse with movement. No weakness, numbness or tingling. No fevers or vomiting.  No evidence of septic joint X-ray negative. EKG nonischemic.  Suspect epicondylitis.  Sling, NSAIDs, pain control. early ROM, f/u PCP.   Date: 11/17/2013  Rate: 66  Rhythm: normal sinus rhythm  QRS Axis: normal  Intervals: normal  ST/T Wave abnormalities: normal  Conduction Disutrbances:none  Narrative Interpretation:   Old EKG Reviewed: unchanged    I personally performed the services described in this documentation, which was scribed in my presence. The recorded information has been reviewed and is accurate.   Ezequiel Essex, MD 11/17/13 1806

## 2014-01-02 DIAGNOSIS — N951 Menopausal and female climacteric states: Secondary | ICD-10-CM | POA: Insufficient documentation

## 2014-01-03 ENCOUNTER — Encounter: Payer: Self-pay | Admitting: Gastroenterology

## 2014-02-07 ENCOUNTER — Other Ambulatory Visit: Payer: Self-pay

## 2014-02-07 ENCOUNTER — Ambulatory Visit (INDEPENDENT_AMBULATORY_CARE_PROVIDER_SITE_OTHER): Payer: BC Managed Care – PPO | Admitting: Nurse Practitioner

## 2014-02-07 ENCOUNTER — Encounter: Payer: Self-pay | Admitting: Gastroenterology

## 2014-02-07 VITALS — BP 109/66 | HR 70 | Temp 98.3°F | Ht 65.0 in | Wt 150.2 lb

## 2014-02-07 DIAGNOSIS — Z86010 Personal history of colonic polyps: Secondary | ICD-10-CM

## 2014-02-07 DIAGNOSIS — R1013 Epigastric pain: Secondary | ICD-10-CM

## 2014-02-07 DIAGNOSIS — Z800 Family history of malignant neoplasm of digestive organs: Secondary | ICD-10-CM

## 2014-02-07 DIAGNOSIS — R110 Nausea: Secondary | ICD-10-CM

## 2014-02-07 MED ORDER — OMEPRAZOLE 20 MG PO CPDR: 20.0000 mg | DELAYED_RELEASE_CAPSULE | Freq: Every day | ORAL | Status: DC

## 2014-02-07 MED ORDER — PEG-KCL-NACL-NASULF-NA ASC-C 100 G PO SOLR: 1.0000 | ORAL | Status: DC

## 2014-02-07 NOTE — Patient Instructions (Signed)
1. We will schedule you for a repeat colonoscopy and EGD. 2. Start prilosec 20mg  once a day 30 minutes before your first meal of the day. 3. Try to reduce the amount of caffeine and carbonated beverages you consume, if possible. 4. Further recommendations pending results of procedures.

## 2014-02-07 NOTE — Assessment & Plan Note (Signed)
GERD symptoms worse with spicy foods. Consumes moderate to large amount of caffeine and carbonated beverages. Chronic NSAID use without PPI use. Suspect esophagitis or gastritis and GERD but cannot rule out gastric ulceration other more insidious process.  Proceed with surveillance TCS and EGD with Dr. Gala Romney in near future: the risks, benefits, and alternatives have been discussed with the patient in detail. The patient states understanding and desires to proceed.

## 2014-02-07 NOTE — Assessment & Plan Note (Signed)
3 year history of chronic nausea and associated GERD symptoms. No red flag/warning signs. History of chronic NSAID use. 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 Will start on Prilosec 20mg  daily for NSAID gastric protection as well as schedule for EGD.  Proceed with surveillance TCS and EGD with Dr. Gala Romney in near future: the risks, benefits, and alternatives have been discussed with the patient in detail. The patient states understanding and desires to proceed.

## 2014-02-07 NOTE — Assessment & Plan Note (Signed)
Mother with history of colon cancer and personal history of colon polypectomy on initial colonoscopy 5 years ago. Recommended 5 year follow-up. Denies any concerning warning/red flag signs/symptoms. Not on any anticoagulants, no previous difficulty with sedation for colonoscopy.  Proceed with surveillance TCS and EGD with Dr. Gala Romney in near future: the risks, benefits, and alternatives have been discussed with the patient in detail. The patient states understanding and desires to proceed.

## 2014-02-07 NOTE — Progress Notes (Signed)
Pre [employer] patient does not need PA for TCS/EGD

## 2014-02-07 NOTE — Progress Notes (Signed)
Primary Care Physician:  Florina Ou, MD Primary Gastroenterologist:  Dr. Gala Romney  Chief Complaint  Patient presents with  . Nausea    chronic    HPI:   53 year old female presents for consult at the request of Dr. Modena Morrow for evaluation of chronic nausea. Has had chronic nausea for approximately 3 years. Has had appendectomy and cholecystectomy. Does take NSAIDs and on her last colonoscopy there was a question of NSAID-related changes. Does not take ASA powders. Has GERD symptoms a couple times a week and is worsened with certain foods such as spicy and greasy foods. Consumes several cups of coffee in the morning, occasional cup in the afternoon, and some soda/tea during the day. Will sometimes have to eat just prior to laying down for sleep in the evening. No currently on a PPI. Denies dysphagia, abdominal pain, vomiting. Has occasional loose stools since cholecystectomy. Has a bowel movement at least once a day which is loose more often than not. Denies hematochezia, melena, and abdominal pain. Denies any other upper or lower GI signs and symptoms. Is due for a colonoscopy and expressed interest in having that done here as she lives in the area and it's difficult to get to [place].   Past Medical History  Diagnosis Date  . Diverticula of colon   . Ovarian cyst   . Renal disorder     kidney stones    Past Surgical History  Procedure Laterality Date  . Cholecystectomy    . Appendectomy    . Tubal ligation    . Kidney stone surgery      Current Outpatient Prescriptions  Medication Sig Dispense Refill  . ibuprofen (ADVIL,MOTRIN) 800 MG tablet Take 1 tablet (800 mg total) by mouth 3 (three) times daily. 21 tablet 0  . HYDROcodone-acetaminophen (NORCO/VICODIN) 5-325 MG per tablet Take 2 tablets by mouth every 4 (four) hours as needed. (Patient not taking: Reported on 02/07/2014) 10 tablet 0  . naproxen sodium (ANAPROX) 220 MG tablet Take 440 mg by mouth daily as needed (pain). For  pain    . ondansetron (ZOFRAN) 4 MG tablet Take 4 mg by mouth every 8 (eight) hours as needed for nausea or vomiting.     No current facility-administered medications for this visit.    Allergies as of 02/07/2014 - Review Complete 02/07/2014  Allergen Reaction Noted  . Penicillins Shortness Of Breath and Swelling 05/27/2011  . Sulfa antibiotics  02/07/2014    Family History  Problem Relation Age of Onset  . Heart disease Father     Pacemaker  . CAD Mother 20    CABG  . Cancer Mother     Died age 76 of colon CA  . Atrial fibrillation Sister     History   Social History  . Marital Status: Married    Spouse Name: N/A    Number of Children: 4  . Years of Education: N/A   Occupational History  .  [employer]   Social History Main Topics  . Smoking status: Current Every Day Smoker -- 1.00 packs/day for 35 years    Types: Cigarettes  . Smokeless tobacco: Not on file  . Alcohol Use: Yes  . Drug Use: No  . Sexual Activity: Not on file   Other Topics Concern  . Not on file   Social History Narrative   Lives at home with husband    Review of Systems: Gen: Denies any fever, chills, fatigue, weight loss, lack of appetite.  CV: Denies chest pain, heart palpitations, peripheral edema,.  Resp: Denies shortness of breath at rest or with exertion. Denies wheezing or cough. No breathing difficulty laying flat. GI: Denies odynophagia. Denies jaundice and hematemesis. GU : Denies urinary burning, urinary frequency, urinary hesitancy MS: Denies joint pain, muscle weakness, cramps, or limitation of movement.  Derm: Denies rash, itching, dry skin Psych: Denies depression, anxiety. Heme: Denies bruising, bleeding, and enlarged lymph nodes.  Physical Exam: BP 109/66 mmHg  Pulse 70  Temp(Src) 98.3 F (36.8 C)  Ht 5\' 5"  (1.651 m)  Wt 150 lb 3.2 oz (68.13 kg)  BMI 24.99 kg/m2 General:   Alert and oriented. Pleasant and cooperative. Well-nourished and well-developed.  Head:   Normocephalic and atraumatic. Eyes:  Without icterus, sclera clear and conjunctiva pink.  Ears:  Normal auditory acuity. Nose:  No deformity, discharge,  or lesions. Mouth:  No deformity or lesions, oral mucosa pink. No OP edema.  Neck:  Supple, without mass or thyromegaly. Lungs:  Clear to auscultation bilaterally. No wheezes, rales, or rhonchi. No distress.  Heart:  S1, S2 present without murmurs appreciated. No carotid bruit noted. Abdomen:  +BS, soft and non-distended. No HSM noted. No guarding or rebound. No masses appreciated. Mild epigastric tenderness/discomfort to palpation. Rectal:  Deferred  Msk:  Symmetrical without gross deformities. Normal posture. Extremities:  Without clubbing or edema. Neurologic:  Alert and  oriented x4;  grossly normal neurologically. Skin:  Intact without significant lesions or rashes. Cervical Nodes:  No significant cervical adenopathy. Psych:  Alert and cooperative. Normal mood and affect.     02/07/2014 2:29 PM

## 2014-02-15 NOTE — Progress Notes (Signed)
cc'ed to pcp °

## 2014-03-02 ENCOUNTER — Telehealth: Payer: Self-pay | Admitting: Internal Medicine

## 2014-03-02 ENCOUNTER — Other Ambulatory Visit: Payer: Self-pay

## 2014-03-02 MED ORDER — PEG 3350-KCL-NA BICARB-NACL 420 G PO SOLR: 4000.0000 mL | ORAL | Status: DC

## 2014-03-02 MED ORDER — PEG-KCL-NACL-NASULF-NA ASC-C 100 G PO SOLR: 1.0000 | ORAL | Status: DC

## 2014-03-02 NOTE — Telephone Encounter (Signed)
Pt called to see if we could call her prep into [facility], because the other pharmacy was going to cost her $75 She also had questions whether or not her [employer] was going to cover her procedure. Please call her back at [phone] and she said to [facility] if she doesn't answer

## 2014-03-02 NOTE — Telephone Encounter (Signed)
Ordered prep to go to [facility] per pt request.  LMOM for pt .

## 2014-03-09 ENCOUNTER — Encounter (HOSPITAL_COMMUNITY): Payer: Self-pay | Admitting: *Deleted

## 2014-03-09 ENCOUNTER — Ambulatory Visit (HOSPITAL_COMMUNITY)
Admission: RE | Admit: 2014-03-09 | Discharge: 2014-03-09 | Disposition: A | Discharge status: Home / Self Care | Payer: BLUE CROSS/BLUE SHIELD | Source: Ambulatory Visit | Attending: Internal Medicine | Admitting: Internal Medicine

## 2014-03-09 ENCOUNTER — Encounter (HOSPITAL_COMMUNITY)
Admission: RE | Disposition: A | Discharge status: Home / Self Care | Payer: Self-pay | Source: Ambulatory Visit | Attending: Internal Medicine

## 2014-03-09 DIAGNOSIS — Z87442 Personal history of urinary calculi: Secondary | ICD-10-CM | POA: Diagnosis not present

## 2014-03-09 DIAGNOSIS — Z79899 Other long term (current) drug therapy: Secondary | ICD-10-CM | POA: Insufficient documentation

## 2014-03-09 DIAGNOSIS — Z800 Family history of malignant neoplasm of digestive organs: Secondary | ICD-10-CM | POA: Diagnosis not present

## 2014-03-09 DIAGNOSIS — F17210 Nicotine dependence, cigarettes, uncomplicated: Secondary | ICD-10-CM | POA: Insufficient documentation

## 2014-03-09 DIAGNOSIS — K210 Gastro-esophageal reflux disease with esophagitis: Secondary | ICD-10-CM | POA: Insufficient documentation

## 2014-03-09 DIAGNOSIS — K2950 Unspecified chronic gastritis without bleeding: Secondary | ICD-10-CM | POA: Insufficient documentation

## 2014-03-09 DIAGNOSIS — R110 Nausea: Secondary | ICD-10-CM | POA: Diagnosis present

## 2014-03-09 DIAGNOSIS — Z86010 Personal history of colonic polyps: Secondary | ICD-10-CM

## 2014-03-09 DIAGNOSIS — Z791 Long term (current) use of non-steroidal anti-inflammatories (NSAID): Secondary | ICD-10-CM | POA: Diagnosis not present

## 2014-03-09 DIAGNOSIS — D120 Benign neoplasm of cecum: Secondary | ICD-10-CM

## 2014-03-09 DIAGNOSIS — K222 Esophageal obstruction: Secondary | ICD-10-CM | POA: Insufficient documentation

## 2014-03-09 DIAGNOSIS — K5730 Diverticulosis of large intestine without perforation or abscess without bleeding: Secondary | ICD-10-CM | POA: Diagnosis not present

## 2014-03-09 DIAGNOSIS — R1013 Epigastric pain: Secondary | ICD-10-CM

## 2014-03-09 DIAGNOSIS — K3189 Other diseases of stomach and duodenum: Secondary | ICD-10-CM

## 2014-03-09 HISTORY — PX: ESOPHAGOGASTRODUODENOSCOPY: SHX5428

## 2014-03-09 HISTORY — PX: COLONOSCOPY: SHX5424

## 2014-03-09 SURGERY — COLONOSCOPY
Anesthesia: Moderate Sedation

## 2014-03-09 MED ORDER — SODIUM CHLORIDE 0.9 % IV SOLN
INTRAVENOUS | Status: DC
  Administered 2014-03-09: 1000 mL via INTRAVENOUS

## 2014-03-09 MED ORDER — ONDANSETRON HCL 4 MG/2ML IJ SOLN
INTRAMUSCULAR | Status: AC
  Filled 2014-03-09: qty 2

## 2014-03-09 MED ORDER — ONDANSETRON HCL 4 MG/2ML IJ SOLN
INTRAMUSCULAR | Status: DC | PRN
  Administered 2014-03-09: 4 mg via INTRAVENOUS

## 2014-03-09 MED ORDER — MIDAZOLAM HCL 5 MG/5ML IJ SOLN
INTRAMUSCULAR | Status: AC
  Filled 2014-03-09: qty 10

## 2014-03-09 MED ORDER — MEPERIDINE HCL 100 MG/ML IJ SOLN
INTRAMUSCULAR | Status: DC | PRN
  Administered 2014-03-09: 50 mg via INTRAVENOUS
  Administered 2014-03-09: 25 mg via INTRAVENOUS

## 2014-03-09 MED ORDER — MEPERIDINE HCL 100 MG/ML IJ SOLN
INTRAMUSCULAR | Status: AC
  Filled 2014-03-09: qty 2

## 2014-03-09 MED ORDER — STERILE WATER FOR IRRIGATION IR SOLN
Status: DC | PRN
  Administered 2014-03-09: 11:00:00

## 2014-03-09 MED ORDER — LIDOCAINE VISCOUS 2 % MT SOLN
OROMUCOSAL | Status: AC
  Filled 2014-03-09: qty 15

## 2014-03-09 MED ORDER — LIDOCAINE VISCOUS 2 % MT SOLN
OROMUCOSAL | Status: DC | PRN
  Administered 2014-03-09: 3 mL via OROMUCOSAL

## 2014-03-09 MED ORDER — MIDAZOLAM HCL 5 MG/5ML IJ SOLN
INTRAMUSCULAR | Status: DC | PRN
  Administered 2014-03-09 (×2): 1 mg via INTRAVENOUS
  Administered 2014-03-09 (×2): 2 mg via INTRAVENOUS
  Administered 2014-03-09: 1 mg via INTRAVENOUS

## 2014-03-09 NOTE — Discharge Instructions (Signed)
Colonoscopy Discharge Instructions  Read the instructions outlined below and refer to this sheet in the next few weeks. These discharge instructions provide you with general information on caring for yourself after you leave the hospital. Your doctor may also give you specific instructions. While your treatment has been planned according to the most current medical practices available, unavoidable complications occasionally occur. If you have any problems or questions after discharge, call Dr. Gala Romney at [phone]. ACTIVITY  You may resume your regular activity, but move at a slower pace for the next 24 hours.   Take frequent rest periods for the next 24 hours.   Walking will help get rid of the air and reduce the bloated feeling in your belly (abdomen).   No driving for 24 hours (because of the medicine (anesthesia) used during the test).    Do not sign any important legal documents or operate any machinery for 24 hours (because of the anesthesia used during the test).  NUTRITION  Drink plenty of fluids.   You may resume your normal diet as instructed by your doctor.   Begin with a light meal and progress to your normal diet. Heavy or fried foods are harder to digest and may make you feel sick to your stomach (nauseated).   Avoid alcoholic beverages for 24 hours or as instructed.  MEDICATIONS  You may resume your normal medications unless your doctor tells you otherwise.  WHAT YOU CAN EXPECT TODAY  Some feelings of bloating in the abdomen.   Passage of more gas than usual.   Spotting of blood in your stool or on the toilet paper.  IF YOU HAD POLYPS REMOVED DURING THE COLONOSCOPY:  No aspirin products for 7 days or as instructed.   No alcohol for 7 days or as instructed.   Eat a soft diet for the next 24 hours.  FINDING OUT THE RESULTS OF YOUR TEST Not all test results are available during your visit. If your test results are not back during the visit, make an appointment  with your caregiver to find out the results. Do not assume everything is normal if you have not heard from your caregiver or the medical facility. It is important for you to follow up on all of your test results.  SEEK IMMEDIATE MEDICAL ATTENTION IF:  You have more than a spotting of blood in your stool.   Your belly is swollen (abdominal distention).   You are nauseated or vomiting.   You have a temperature over 101.   You have abdominal pain or discomfort that is severe or gets worse throughout the day.   EGD Discharge instructions Please read the instructions outlined below and refer to this sheet in the next few weeks. These discharge instructions provide you with general information on caring for yourself after you leave the hospital. Your doctor may also give you specific instructions. While your treatment has been planned according to the most current medical practices available, unavoidable complications occasionally occur. If you have any problems or questions after discharge, please call your doctor. ACTIVITY  You may resume your regular activity but move at a slower pace for the next 24 hours.   Take frequent rest periods for the next 24 hours.   Walking will help expel (get rid of) the air and reduce the bloated feeling in your abdomen.   No driving for 24 hours (because of the anesthesia (medicine) used during the test).   You may shower.   Do not sign any  important legal documents or operate any machinery for 24 hours (because of the anesthesia used during the test).  NUTRITION  Drink plenty of fluids.   You may resume your normal diet.   Begin with a light meal and progress to your normal diet.   Avoid alcoholic beverages for 24 hours or as instructed by your caregiver.  MEDICATIONS  You may resume your normal medications unless your caregiver tells you otherwise.  WHAT YOU CAN EXPECT TODAY  You may experience abdominal discomfort such as a feeling of  fullness or gas pains.  FOLLOW-UP  Your doctor will discuss the results of your test with you.  SEEK IMMEDIATE MEDICAL ATTENTION IF ANY OF THE FOLLOWING OCCUR:  Excessive nausea (feeling sick to your stomach) and/or vomiting.   Severe abdominal pain and distention (swelling).   Trouble swallowing.   Temperature over 101 F (37.8 C).   Rectal bleeding or vomiting of blood.    GERD, diverticulosis and polyp information provided  Increase Prilosec to 20 mg twice daily  Further recommendations to follow pending review of pathology report  Colon Polyps Polyps are lumps of extra tissue growing inside the body. Polyps can grow in the large intestine (colon). Most colon polyps are noncancerous (benign). However, some colon polyps can become cancerous over time. Polyps that are larger than a pea may be harmful. To be safe, caregivers remove and test all polyps. CAUSES  Polyps form when mutations in the genes cause your cells to grow and divide even though no more tissue is needed. RISK FACTORS There are a number of risk factors that can increase your chances of getting colon polyps. They include:  Being older than 50 years.  Family history of colon polyps or colon cancer.  Long-term colon diseases, such as colitis or Crohn disease.  Being overweight.  Smoking.  Being inactive.  Drinking too much alcohol. SYMPTOMS  Most small polyps do not cause symptoms. If symptoms are present, they may include:  Blood in the stool. The stool may look dark red or black.  Constipation or diarrhea that lasts longer than 1 week. DIAGNOSIS People often do not know they have polyps until their caregiver finds them during a regular checkup. Your caregiver can use 4 tests to check for polyps:  Digital rectal exam. The caregiver wears gloves and feels inside the rectum. This test would find polyps only in the rectum.  Barium enema. The caregiver puts a liquid called barium into your rectum  before taking X-rays of your colon. Barium makes your colon look white. Polyps are dark, so they are easy to see in the X-ray pictures.  Sigmoidoscopy. A thin, flexible tube (sigmoidoscope) is placed into your rectum. The sigmoidoscope has a light and tiny camera in it. The caregiver uses the sigmoidoscope to look at the last third of your colon.  Colonoscopy. This test is like sigmoidoscopy, but the caregiver looks at the entire colon. This is the most common method for finding and removing polyps. TREATMENT  Any polyps will be removed during a sigmoidoscopy or colonoscopy. The polyps are then tested for cancer. PREVENTION  To help lower your risk of getting more colon polyps:  Eat plenty of fruits and vegetables. Avoid eating fatty foods.  Do not smoke.  Avoid drinking alcohol.  Exercise every day.  Lose weight if recommended by your caregiver.  Eat plenty of calcium and folate. Foods that are rich in calcium include milk, cheese, and broccoli. Foods that are rich in folate include  chickpeas, kidney beans, and spinach. HOME CARE INSTRUCTIONS Keep all follow-up appointments as directed by your caregiver. You may need periodic exams to check for polyps. SEEK MEDICAL CARE IF: You notice bleeding during a bowel movement. Document Released: 10/31/2003 Document Revised: 04/28/2011 Document Reviewed: 04/15/2011 [facility] Patient Information 2015 [place], [place]. This information is not intended to replace advice given to you by your health care provider. Make sure you discuss any questions you have with your health care provider.   Diverticulosis Diverticulosis is the condition that develops when small pouches (diverticula) form in the wall of your colon. Your colon, or large intestine, is where water is absorbed and stool is formed. The pouches form when the inside layer of your colon pushes through weak spots in the outer layers of your colon. CAUSES  No one knows exactly what causes  diverticulosis. RISK FACTORS  Being older than 3. Your risk for this condition increases with age. Diverticulosis is rare in people younger than 40 years. By age 70, almost everyone has it.  Eating a low-fiber diet.  Being frequently constipated.  Being overweight.  Not getting enough exercise.  Smoking.  Taking over-the-counter pain medicines, like aspirin and ibuprofen. SYMPTOMS  Most people with diverticulosis do not have symptoms. DIAGNOSIS  Because diverticulosis often has no symptoms, health care providers often discover the condition during an exam for other colon problems. In many cases, a health care provider will diagnose diverticulosis while using a flexible scope to examine the colon (colonoscopy). TREATMENT  If you have never developed an infection related to diverticulosis, you may not need treatment. If you have had an infection before, treatment may include:  Eating more fruits, vegetables, and grains.  Taking a fiber supplement.  Taking a live bacteria supplement (probiotic).  Taking medicine to relax your colon. HOME CARE INSTRUCTIONS   Drink at least 6-8 glasses of water each day to prevent constipation.  Try not to strain when you have a bowel movement.  Keep all follow-up appointments. If you have had an infection before:  Increase the fiber in your diet as directed by your health care provider or dietitian.  Take a dietary fiber supplement if your health care provider approves.  Only take medicines as directed by your health care provider. SEEK MEDICAL CARE IF:   You have abdominal pain.  You have bloating.  You have cramps.  You have not gone to the bathroom in 3 days. SEEK IMMEDIATE MEDICAL CARE IF:   Your pain gets worse.  Yourbloating becomes very bad.  You have a fever or chills, and your symptoms suddenly get worse.  You begin vomiting.  You have bowel movements that are bloody or black. MAKE SURE YOU:  Understand these  instructions.  Will watch your condition.  Will get help right away if you are not doing well or get worse. Document Released: 11/01/2003 Document Revised: 02/08/2013 Document Reviewed: 12/29/2012 [facility] Patient Information 2015 [place], [place]. This information is not intended to replace advice given to you by your health care provider. Make sure you discuss any questions you have with your health care provider.    Gastroesophageal Reflux Disease, Adult Gastroesophageal reflux disease (GERD) happens when acid from your stomach flows up into the esophagus. When acid comes in contact with the esophagus, the acid causes soreness (inflammation) in the esophagus. Over time, GERD may create small holes (ulcers) in the lining of the esophagus. CAUSES   Increased body weight. This puts pressure on the stomach, making acid rise  from the stomach into the esophagus.  Smoking. This increases acid production in the stomach.  Drinking alcohol. This causes decreased pressure in the lower esophageal sphincter (valve or ring of muscle between the esophagus and stomach), allowing acid from the stomach into the esophagus.  Late evening meals and a full stomach. This increases pressure and acid production in the stomach.  A malformed lower esophageal sphincter. Sometimes, no cause is found. SYMPTOMS   Burning pain in the lower part of the mid-chest behind the breastbone and in the mid-stomach area. This may occur twice a week or more often.  Trouble swallowing.  Sore throat.  Dry cough.  Asthma-like symptoms including chest tightness, shortness of breath, or wheezing. DIAGNOSIS  Your caregiver may be able to diagnose GERD based on your symptoms. In some cases, X-rays and other tests may be done to check for complications or to check the condition of your stomach and esophagus. TREATMENT  Your caregiver may recommend over-the-counter or prescription medicines to help decrease acid production. Ask  your caregiver before starting or adding any new medicines.  HOME CARE INSTRUCTIONS   Change the factors that you can control. Ask your caregiver for guidance concerning weight loss, quitting smoking, and alcohol consumption.  Avoid foods and drinks that make your symptoms worse, such as:  Caffeine or alcoholic drinks.  Chocolate.  Peppermint or mint flavorings.  Garlic and onions.  Spicy foods.  Citrus fruits, such as oranges, lemons, or limes.  Tomato-based foods such as sauce, chili, salsa, and pizza.  Fried and fatty foods.  Avoid lying down for the 3 hours prior to your bedtime or prior to taking a nap.  Eat small, frequent meals instead of large meals.  Wear loose-fitting clothing. Do not wear anything tight around your waist that causes pressure on your stomach.  Raise the head of your bed 6 to 8 inches with wood blocks to help you sleep. Extra pillows will not help.  Only take over-the-counter or prescription medicines for pain, discomfort, or fever as directed by your caregiver.  Do not take aspirin, ibuprofen, or other nonsteroidal anti-inflammatory drugs (NSAIDs). SEEK IMMEDIATE MEDICAL CARE IF:   You have pain in your arms, neck, jaw, teeth, or back.  Your pain increases or changes in intensity or duration.  You develop nausea, vomiting, or sweating (diaphoresis).  You develop shortness of breath, or you faint.  Your vomit is green, yellow, black, or looks like coffee grounds or blood.  Your stool is red, bloody, or black. These symptoms could be signs of other problems, such as heart disease, gastric bleeding, or esophageal bleeding. MAKE SURE YOU:   Understand these instructions.  Will watch your condition.  Will get help right away if you are not doing well or get worse. Document Released: 11/13/2004 Document Revised: 04/28/2011 Document Reviewed: 08/23/2010 [facility] Patient Information 2015 [place], [place]. This information is not intended to  replace advice given to you by your health care provider. Make sure you discuss any questions you have with your health care provider.

## 2014-03-09 NOTE — H&P (View-Only) (Signed)
Primary Care Physician:  Florina Ou, MD Primary Gastroenterologist:  Dr. Gala Romney  Chief Complaint  Patient presents with  . Nausea    chronic    HPI:   54 year old female presents for consult at the request of Dr. Modena Morrow for evaluation of chronic nausea. Has had chronic nausea for approximately 3 years. Has had appendectomy and cholecystectomy. Does take NSAIDs and on her last colonoscopy there was a question of NSAID-related changes. Does not take ASA powders. Has GERD symptoms a couple times a week and is worsened with certain foods such as spicy and greasy foods. Consumes several cups of coffee in the morning, occasional cup in the afternoon, and some soda/tea during the day. Will sometimes have to eat just prior to laying down for sleep in the evening. No currently on a PPI. Denies dysphagia, abdominal pain, vomiting. Has occasional loose stools since cholecystectomy. Has a bowel movement at least once a day which is loose more often than not. Denies hematochezia, melena, and abdominal pain. Denies any other upper or lower GI signs and symptoms. Is due for a colonoscopy and expressed interest in having that done here as she lives in the area and it's difficult to get to [place].   Past Medical History  Diagnosis Date  . Diverticula of colon   . Ovarian cyst   . Renal disorder     kidney stones    Past Surgical History  Procedure Laterality Date  . Cholecystectomy    . Appendectomy    . Tubal ligation    . Kidney stone surgery      Current Outpatient Prescriptions  Medication Sig Dispense Refill  . ibuprofen (ADVIL,MOTRIN) 800 MG tablet Take 1 tablet (800 mg total) by mouth 3 (three) times daily. 21 tablet 0  . HYDROcodone-acetaminophen (NORCO/VICODIN) 5-325 MG per tablet Take 2 tablets by mouth every 4 (four) hours as needed. (Patient not taking: Reported on 02/07/2014) 10 tablet 0  . naproxen sodium (ANAPROX) 220 MG tablet Take 440 mg by mouth daily as needed (pain). For  pain    . ondansetron (ZOFRAN) 4 MG tablet Take 4 mg by mouth every 8 (eight) hours as needed for nausea or vomiting.     No current facility-administered medications for this visit.    Allergies as of 02/07/2014 - Review Complete 02/07/2014  Allergen Reaction Noted  . Penicillins Shortness Of Breath and Swelling 05/27/2011  . Sulfa antibiotics  02/07/2014    Family History  Problem Relation Age of Onset  . Heart disease Father     Pacemaker  . CAD Mother 49    CABG  . Cancer Mother     Died age 67 of colon CA  . Atrial fibrillation Sister     History   Social History  . Marital Status: Married    Spouse Name: N/A    Number of Children: 4  . Years of Education: N/A   Occupational History  .  [employer]   Social History Main Topics  . Smoking status: Current Every Day Smoker -- 1.00 packs/day for 35 years    Types: Cigarettes  . Smokeless tobacco: Not on file  . Alcohol Use: Yes  . Drug Use: No  . Sexual Activity: Not on file   Other Topics Concern  . Not on file   Social History Narrative   Lives at home with husband    Review of Systems: Gen: Denies any fever, chills, fatigue, weight loss, lack of appetite.  CV: Denies chest pain, heart palpitations, peripheral edema,.  Resp: Denies shortness of breath at rest or with exertion. Denies wheezing or cough. No breathing difficulty laying flat. GI: Denies odynophagia. Denies jaundice and hematemesis. GU : Denies urinary burning, urinary frequency, urinary hesitancy MS: Denies joint pain, muscle weakness, cramps, or limitation of movement.  Derm: Denies rash, itching, dry skin Psych: Denies depression, anxiety. Heme: Denies bruising, bleeding, and enlarged lymph nodes.  Physical Exam: BP 109/66 mmHg  Pulse 70  Temp(Src) 98.3 F (36.8 C)  Ht 5\' 5"  (1.651 m)  Wt 150 lb 3.2 oz (68.13 kg)  BMI 24.99 kg/m2 General:   Alert and oriented. Pleasant and cooperative. Well-nourished and well-developed.  Head:   Normocephalic and atraumatic. Eyes:  Without icterus, sclera clear and conjunctiva pink.  Ears:  Normal auditory acuity. Nose:  No deformity, discharge,  or lesions. Mouth:  No deformity or lesions, oral mucosa pink. No OP edema.  Neck:  Supple, without mass or thyromegaly. Lungs:  Clear to auscultation bilaterally. No wheezes, rales, or rhonchi. No distress.  Heart:  S1, S2 present without murmurs appreciated. No carotid bruit noted. Abdomen:  +BS, soft and non-distended. No HSM noted. No guarding or rebound. No masses appreciated. Mild epigastric tenderness/discomfort to palpation. Rectal:  Deferred  Msk:  Symmetrical without gross deformities. Normal posture. Extremities:  Without clubbing or edema. Neurologic:  Alert and  oriented x4;  grossly normal neurologically. Skin:  Intact without significant lesions or rashes. Cervical Nodes:  No significant cervical adenopathy. Psych:  Alert and cooperative. Normal mood and affect.     02/07/2014 2:29 PM

## 2014-03-09 NOTE — Interval H&P Note (Signed)
History and Physical Interval Note:  03/09/2014 11:14 AM  Carol [name]  has presented today for surgery, with the diagnosis of history of polyps/family hx colon cancer/dyspepsia  The various methods of treatment have been discussed with the patient and family. After consideration of risks, benefits and other options for treatment, the patient has consented to  Procedure(s) with comments: COLONOSCOPY (N/A) - 1000 - moved to 12:45 - Ginger to notify pt ESOPHAGOGASTRODUODENOSCOPY (EGD) (N/A) as a surgical intervention .  The patient's history has been reviewed, patient examined, no change in status, stable for surgery.  I have reviewed the patient's chart and labs.  Questions were answered to the patient's satisfaction.     Roby Donaway   No change. EGD and colonoscopy per plan.  The risks, benefits, limitations, imponderables and alternatives regarding both EGD and colonoscopy have been reviewed with the patient. Questions have been answered. All parties agreeable.

## 2014-03-09 NOTE — Op Note (Signed)
[facility] [address] [place], [address]   ENDOSCOPY PROCEDURE REPORT  PATIENT: Carol [name], Carol [name]  MR#: [id number] BIRTHDATE: [birth year] , 53  yrs. old GENDER: female ENDOSCOPIST: R.  Garfield Cornea, MD FACP FACG REFERRED BY:  Florina Ou, M.D. PROCEDURE DATE:  2014/03/11 PROCEDURE:  EGD w/ biopsy INDICATIONS:  Dyspepsia; NSAID use. MEDICATIONS: Versed 5 mg IV and Demerol 75 mg IV in divided doses. Xylocaine gel orally.  Zofran 4 mg IV. ASA CLASS:      Class II  CONSENT: The risks, benefits, limitations, alternatives and imponderables have been discussed.  The potential for biopsy, esophogeal dilation, etc. have also been reviewed.  Questions have been answered.  All parties agreeable.  Please see the history and physical in the medical record for more information.  DESCRIPTION OF PROCEDURE: After the risks benefits and alternatives of the procedure were thoroughly explained, informed consent was obtained.  The EG-[id number] ([id number]) endoscope was introduced through the mouth and advanced to the second portion of the duodenum , limited by Without limitations. The instrument was slowly withdrawn as the mucosa was fully examined.    Noncritical Schatzki's ring.  Mucosal breaks within 3 mm of the GE junction circumferentially.  No Barrett's esophagus.  Esophagus remained patent throughout its course.  Stomach empty.  Couple of pyloric channel erosions present; otherwise, normal-appearing gastric mucosa.  Patent pylorus.  Few duodenal bulbar erosions otherwise, the first and second portion of the duodenum appeared normal.  Retroflexed views revealed no abnormalities.   Biopsies of the abnormal gastric mucosa taken for histologic study.  The scope was then withdrawn from the patient and the procedure completed.  COMPLICATIONS: There were no immediate complications.  ENDOSCOPIC IMPRESSION: Noncritical Schatzki's ring?"not manipulated (no dysphagia). Mild erosive  reflux esophagitis. Gastric and duodenal erosions.  RECOMMENDATIONS: Increase Prilosec to 20 mg twice daily. Follow up on pathology. See colonoscopy report.  REPEAT EXAM:  eSigned:  R. Garfield Cornea, MD Rosalita Chessman [facility] Mar 11, 2014 11:40 AM    CC:  CPT CODES: ICD CODES:  The ICD and CPT codes recommended by this software are interpretations from the data that the clinical staff has captured with the software.  The verification of the translation of this report to the ICD and CPT codes and modifiers is the sole responsibility of the health care institution and practicing physician where this report was generated.  [employer] will not be held responsible for the validity of the ICD and CPT codes included on this report.  AMA assumes no liability for data contained or not contained herein. CPT is a Designer, television/film set of the [employer].  PATIENT NAME:  Carol [name], Carol [name] MR#: [id number]

## 2014-03-09 NOTE — Op Note (Signed)
[place], [address]   COLONOSCOPY PROCEDURE REPORT  PATIENT: Carol [name], Carol [name]  MR#: [id number] BIRTHDATE: 01-18-1961 , 53  yrs. old GENDER: female ENDOSCOPIST: R.  Garfield Cornea, MD FACP [facility] REFERRED LT:JQZES Spear, M.D. PROCEDURE DATE:  03/09/2014 PROCEDURE:   Colonoscopy with snare polypectomy  ASA CLASS:   Class II INDICATIONS:Personal history of colonic adenoma; positive family history of colon cancer at a young age. MEDICATIONS: Versed 7 mg IV and Demerol 75 mg IV in divided doses. Xylocaine gel orally.  Zofran 4 mg IV.  DESCRIPTION OF PROCEDURE:   After the risks benefits and alternatives of the procedure were thoroughly explained, informed consent was obtained.  The digital rectal exam revealed no rectal mass.   The [id number] ([id number])  endoscope was introduced through the anus and advanced to the cecum, which was identified by both the appendix and ileocecal valve. No adverse events experienced. The quality of the prep was adequate.  The instrument was then slowly withdrawn as the colon was fully examined.      COLON FINDINGS: (1) 2 x 6 mm sessile polyp on a fold in the base of the cecum; pancolonic diverticulosis; the remainder of the colonic mucosa appeared normal.  The rectal mucosa appeared entirely normal.  The cecal polyp was removed cleanly with one pass of hot snare cautery.  Retroflexion was performed.   .  Withdrawal time=11 minutes 0 seconds.  The scope was withdrawn and the procedure completed. COMPLICATIONS: There were no immediate complications.  ENDOSCOPIC IMPRESSION: Cecal polyp?"removed as described above.  Pan-diverticulosis  RECOMMENDATIONS: Follow up on pathology. See EGD report.  eSigned:  R. Garfield Cornea, MD FACP [facility] 03/09/2014 12:09 PM   cc:

## 2014-03-10 ENCOUNTER — Encounter (HOSPITAL_COMMUNITY): Payer: Self-pay | Admitting: Internal Medicine

## 2014-03-13 ENCOUNTER — Encounter: Payer: Self-pay | Admitting: Internal Medicine

## 2014-03-13 DIAGNOSIS — K3189 Other diseases of stomach and duodenum: Secondary | ICD-10-CM | POA: Insufficient documentation

## 2014-03-17 ENCOUNTER — Telehealth: Payer: Self-pay | Admitting: Internal Medicine

## 2014-03-17 NOTE — Telephone Encounter (Signed)
Pt had procedure done last Thursday and was calling to see if her results were available yet. Please call [phone]. LM if she doesn't answer

## 2014-03-17 NOTE — Telephone Encounter (Signed)
Tried to call pt- number was busy.  Pt was mailed a letter from [facility] on 03/13/14

## 2014-03-21 NOTE — Telephone Encounter (Signed)
I tried to call pt, LMOM, advised pt that a letter has been mailed and what it said.

## 2014-05-09 ENCOUNTER — Encounter: Payer: Self-pay | Admitting: Gastroenterology

## 2014-05-09 ENCOUNTER — Encounter: Payer: Self-pay | Admitting: Orthopedic Surgery

## 2014-05-09 ENCOUNTER — Ambulatory Visit (INDEPENDENT_AMBULATORY_CARE_PROVIDER_SITE_OTHER): Payer: BLUE CROSS/BLUE SHIELD | Admitting: Orthopedic Surgery

## 2014-05-09 ENCOUNTER — Ambulatory Visit (INDEPENDENT_AMBULATORY_CARE_PROVIDER_SITE_OTHER): Payer: BLUE CROSS/BLUE SHIELD | Admitting: Gastroenterology

## 2014-05-09 ENCOUNTER — Ambulatory Visit (INDEPENDENT_AMBULATORY_CARE_PROVIDER_SITE_OTHER): Payer: BLUE CROSS/BLUE SHIELD

## 2014-05-09 VITALS — BP 111/69 | HR 72 | Temp 97.2°F | Ht 65.0 in | Wt 153.8 lb

## 2014-05-09 VITALS — BP 102/71 | Ht 65.0 in | Wt 150.0 lb

## 2014-05-09 DIAGNOSIS — M25511 Pain in right shoulder: Secondary | ICD-10-CM | POA: Diagnosis not present

## 2014-05-09 DIAGNOSIS — K2100 Gastro-esophageal reflux disease with esophagitis, without bleeding: Secondary | ICD-10-CM

## 2014-05-09 DIAGNOSIS — M7551 Bursitis of right shoulder: Secondary | ICD-10-CM

## 2014-05-09 DIAGNOSIS — K219 Gastro-esophageal reflux disease without esophagitis: Secondary | ICD-10-CM | POA: Insufficient documentation

## 2014-05-09 MED ORDER — ACETAMINOPHEN-CODEINE #3 300-30 MG PO TABS: 1.0000 | ORAL_TABLET | ORAL | Status: DC | PRN

## 2014-05-09 MED ORDER — PREDNISONE (PAK) 10 MG PO TABS: ORAL_TABLET | Freq: Three times a day (TID) | ORAL | Status: DC

## 2014-05-09 NOTE — Patient Instructions (Addendum)
OUT OF WORK 3 WEEKS  Joint Injection Care After Refer to this sheet in the next few days. These instructions provide you with information on caring for yourself after you have had a joint injection. Your caregiver also may give you more specific instructions. Your treatment has been planned according to current medical practices, but problems sometimes occur. Call your caregiver if you have any problems or questions after your procedure. After any type of joint injection, it is not uncommon to experience:  Soreness, swelling, or bruising around the injection site.  Mild numbness, tingling, or weakness around the injection site caused by the numbing medicine used before or with the injection. It also is possible to experience the following effects associated with the specific agent after injection:  Iodine-based contrast agents:  Allergic reaction (itching, hives, widespread redness, and swelling beyond the injection site).  Corticosteroids (These effects are rare.):  Allergic reaction.  Increased blood sugar levels (If you have diabetes and you notice that your blood sugar levels have increased, notify your caregiver).  Increased blood pressure levels.  Mood swings.  Hyaluronic acid in the use of viscosupplementation.  Temporary heat or redness.  Temporary rash and itching.  Increased fluid accumulation in the injected joint. These effects all should resolve within a day after your procedure.  HOME CARE INSTRUCTIONS  Limit yourself to light activity the day of your procedure. Avoid lifting heavy objects, bending, stooping, or twisting.  Take prescription or over-the-counter pain medication as directed by your caregiver.  You may apply ice to your injection site to reduce pain and swelling the day of your procedure. Ice may be applied 03-04 times:  Put ice in a plastic bag.  Place a towel between your skin and the bag.  Leave the ice on for no longer than 15-20 minutes  each time. SEEK IMMEDIATE MEDICAL CARE IF:   Pain and swelling get worse rather than better or extend beyond the injection site.  Numbness does not go away.  Blood or fluid continues to leak from the injection site.  You have chest pain.  You have swelling of your face or tongue.  You have trouble breathing or you become dizzy.  You develop a fever, chills, or severe tenderness at the injection site that last longer than 1 day. MAKE SURE YOU:  Understand these instructions.  Watch your condition.  Get help right away if you are not doing well or if you get worse. Document Released: 10/17/2010 Document Revised: 04/28/2011 Document Reviewed: 10/17/2010 [facility] Patient Information 2015 [place], [place]. This information is not intended to replace advice given to you by your health care provider. Make sure you discuss any questions you have with your health care provider.

## 2014-05-09 NOTE — Progress Notes (Signed)
Referring Provider: Florina Ou, MD Primary Care Physician:  Florina Ou, MD  Primary GI: Dr. Gala Romney   Chief Complaint  Patient presents with  . Nausea    dyspepsia    HPI:   Carol [name] is a 54 y.o. female presenting today with a history of dyspepsia and nausea. Recent EGD on file with non-critical Schatzki's ring, erosive reflux esophagitis, gastric and duodenal erosions. Negative H.pylori. Colonic adenoma and diverticulosis on colonoscopy. Due for surveillance in 5 years.   Right shoulder pain. No abdominal pain. Taking  tylenol. Quit taking Ibuprofen.   Nausea improved but still will have mild episodes. Prilosec BID. Taking as needed. Rare diarrhea secondary to eating habits since cholecystectomy.    Past Medical History  Diagnosis Date  . Diverticula of colon   . Ovarian cyst   . Renal disorder     kidney stones  . Chronic nausea     Past Surgical History  Procedure Laterality Date  . Cholecystectomy  1997  . Appendectomy  2010  . Tubal ligation    . Kidney stone surgery    . Breast lumpectomy      papilloma per pathology  . Colonoscopy  2010    repeat in 5 years; family hx colon ca and polypectomy  . Colonoscopy N/A 03/09/2014    Dr. Gala Romney: tubular adenoma, pancolonic diverticulosis. Surveillance due in 2021.   Marland Kitchen Esophagogastroduodenoscopy N/A 03/09/2014    Dr. Gala Romney: non-critical Schatzki's ring, not manipulated, mild erosive reflux esophagitis, gastric and duodenal erosions with negative H.pylori    Current Outpatient Prescriptions  Medication Sig Dispense Refill  . acetaminophen (TYLENOL) 500 MG tablet Take 500 mg by mouth every 6 (six) hours as needed for mild pain.    Marland Kitchen acetaminophen-codeine (TYLENOL #3) 300-30 MG per tablet Take 1 tablet by mouth every 4 (four) hours as needed for moderate pain. 60 tablet 0  . omeprazole (PRILOSEC) 20 MG capsule Take 1 capsule (20 mg total) by mouth daily. (Patient taking differently: Take 20 mg by mouth daily as  needed (acid reflux). ) 30 capsule 1  . predniSONE (STERAPRED UNI-PAK) 10 MG tablet Take by mouth 3 (three) times daily. 60 tablet 0   No current facility-administered medications for this visit.    Allergies as of 05/09/2014 - Review Complete 05/09/2014  Allergen Reaction Noted  . Penicillins Shortness Of Breath and Swelling 05/27/2011  . Sulfa antibiotics  02/07/2014    Family History  Problem Relation Age of Onset  . Heart disease Father     Pacemaker  . CAD Mother 19    CABG  . Cancer Mother     Died age 63 of colon CA  . Atrial fibrillation Sister     History   Social History  . Marital Status: Married    Spouse Name: N/A  . Number of Children: 4  . Years of Education: N/A   Occupational History  .  [employer]   Social History Main Topics  . Smoking status: Current Every Day Smoker -- 1.00 packs/day for 35 years    Types: Cigarettes  . Smokeless tobacco: Never Used     Comment: Smokes about a pack a day  . Alcohol Use: 0.0 oz/week    0 Standard drinks or equivalent per week     Comment: socially/rarely  . Drug Use: No  . Sexual Activity: Not on file   Other Topics Concern  . None   Social History Narrative   Lives at home  with husband    Review of Systems: As mentioned in HPI  Physical Exam: BP 111/69 mmHg  Pulse 72  Temp(Src) 97.2 F (36.2 C) (Oral)  Ht 5\' 5"  (1.651 m)  Wt 153 lb 12.8 oz (69.763 kg)  BMI 25.59 kg/m2 General:   Alert and oriented. No distress noted. Pleasant and cooperative.  Head:  Normocephalic and atraumatic. Eyes:  Conjuctiva clear without scleral icterus. Abdomen:  +BS, soft, non-tender and non-distended. No rebound or guarding. No HSM or masses noted. Msk:  Symmetrical without gross deformities. Normal posture. Extremities:  Without edema. Neurologic:  Alert and  oriented x4;  grossly normal neurologically. Psych:  Alert and cooperative. Normal mood and affect.

## 2014-05-09 NOTE — Progress Notes (Signed)
cc'ed to pcp °

## 2014-05-09 NOTE — Patient Instructions (Signed)
Take Omeprazole 20 mg by mouth 30 minutes before breakfast and dinner while you are taking the prednisone. When you are done with the prednisone, you may decrease this to just once a day.   We will see you in 3 months! Your next colonoscopy is in 5 years.

## 2014-05-09 NOTE — Assessment & Plan Note (Signed)
54 year old female with erosive esophagitis, improvement in dyspepsia with Prilosec and avoiding NSAIDs. While on Prednisone, keep taking Prilosec BID. Once course is done, decrease to once daily indefinitely. Encouraged to take routinely. Will see back in 3 months.

## 2014-05-09 NOTE — Progress Notes (Signed)
Patient ID: Carol [name], female   DOB: 13-Dec-1960, 54 y.o.   MRN: [id number]  Chief Complaint  Patient presents with  . Shoulder Pain    Right shoulder pain, no injury.    History this is a 54 year old female who works at the [employer] presents with gradual onset of increasing pain over the right shoulder radiating into the cervical spine axilla and down the right arm to the elbow. Her Activities seem to make it worse including activities at work.  She describes sharp throbbing stabbing radiating pain over the right shoulder she rates a 9 out of 10 she took Tylenol without relief system review nausea seasonal allergies otherwise normal medical history of reflux  Surgical history of colonoscopy removal of noncancerous polyps, kidney stone extraction, diverticulitis is noted in the history and she had a right lumpectomy for cyst  No medications currently. Allergic to penicillin and sulfa. Family history of GERD stroke heart disease cancer  One pack per day smoker no drinking  BP 102/71 mmHg  Ht 5\' 5"  (1.651 m)  Wt 150 lb (68.04 kg)  BMI 24.96 kg/m2 She is awake alert and oriented 3 body habitus ectomorphic she is oriented to person place and time her mood is flat and depressed secondary to pain she's holding her right shoulder gait is normal  She has tenderness around the shoulder joint anterior, middle and posterior acromion no axillary tenderness or mass normal external rotation painful [place] elevation to 100 stability tests deferred because of pain internal/external strength normal Anus Cannot Be Tested Positive Impingement Sign Pulses Normal Skin Normal Sensation Normal Reflexes Normal  X-Rays Show Normal Shoulder  Impression  Encounter Diagnoses  Name Primary?  . Pain in joint, shoulder region, right Yes  . Shoulder bursitis, right      Procedure note the subacromial injection shoulder RIGHT  Verbal consent was obtained to inject the  RIGHT   Shoulder  Timeout was  completed to confirm the injection site is a subacromial space of the  RIGHT  shoulder   Medication used Depo-Medrol 40 mg and lidocaine 1% 3 cc  Anesthesia was provided by ethyl chloride  The injection was performed in the RIGHT  posterior subacromial space. After pinning the skin with alcohol and anesthetized the skin with ethyl chloride the subacromial space was injected using a 20-gauge needle. There were no complications  Sterile dressing was applied.    Recommend out of work for 3 weeks Right shoulder injection Prednisone 10 mg 3 times a day #60 Tylenol No. 3 one every 4 when necessary pain #60 Return in 3 weeks

## 2014-05-30 ENCOUNTER — Encounter: Payer: Self-pay | Admitting: Orthopedic Surgery

## 2014-05-30 ENCOUNTER — Ambulatory Visit (INDEPENDENT_AMBULATORY_CARE_PROVIDER_SITE_OTHER): Payer: BLUE CROSS/BLUE SHIELD | Admitting: Orthopedic Surgery

## 2014-05-30 VITALS — BP 124/77 | Ht 65.0 in | Wt 150.0 lb

## 2014-05-30 DIAGNOSIS — M75101 Unspecified rotator cuff tear or rupture of right shoulder, not specified as traumatic: Secondary | ICD-10-CM | POA: Diagnosis not present

## 2014-05-30 MED ORDER — ACETAMINOPHEN-CODEINE #3 300-30 MG PO TABS: 1.0000 | ORAL_TABLET | ORAL | Status: DC | PRN

## 2014-05-30 NOTE — Patient Instructions (Signed)
We will schedule MRI for you and call you with results  Will refer you to Dr Arnoldo Morale for Right C spine mass  Out of work three weeks

## 2014-05-30 NOTE — Progress Notes (Signed)
Chief Complaint  Patient presents with  . Follow-up    3 week recheck on right shoulder after injection.    History 54 year old female works at [employer] area at [employer] presents with persistent pain in the right shoulder. She continues to have pain and can't lift her arm above her head. I gave her an injection, I put her on prednisone and Tylenol No. 3 no improvement  She denies any numbness or tingling in her arm or hand she doesn't have any significant neck pain to speak of  Reevaluation she has global tenderness around the acromial area with decreased range of motion weakness of the supraspinatus grade 4 stability tests were normal neurovascular exam was intact  Recommend MRI right shoulder. MRI is being done to evaluate the cuff for tear determine whether we will continue nonoperative treatment, do a decompression or need to do cuff repair

## 2014-06-01 ENCOUNTER — Telehealth: Payer: Self-pay | Admitting: Orthopedic Surgery

## 2014-06-01 NOTE — Telephone Encounter (Signed)
Most recent notes, date of service 05/30/14, faxed to short-term disability insurer, [employer], fax# [phone].

## 2014-06-05 ENCOUNTER — Telehealth: Payer: Self-pay | Admitting: Orthopedic Surgery

## 2014-06-05 NOTE — Telephone Encounter (Signed)
Patient was taking Prednisone and she thinks she is having some side effects from it, she states shes having headaches, leg pain, she is asking should she take her pain medication please advise?

## 2014-06-06 NOTE — Telephone Encounter (Signed)
Advised patient to discontinue if having adverse reaction, but it is okay to take pain medication as well since they are two different medications

## 2014-06-13 ENCOUNTER — Ambulatory Visit (HOSPITAL_COMMUNITY)
Admission: RE | Admit: 2014-06-13 | Discharge: 2014-06-13 | Disposition: A | Discharge status: Home / Self Care | Payer: BLUE CROSS/BLUE SHIELD | Source: Ambulatory Visit | Attending: Orthopedic Surgery | Admitting: Orthopedic Surgery

## 2014-06-13 DIAGNOSIS — M75101 Unspecified rotator cuff tear or rupture of right shoulder, not specified as traumatic: Secondary | ICD-10-CM | POA: Diagnosis present

## 2014-06-14 NOTE — Progress Notes (Signed)
PATIENT AWARE

## 2014-06-15 ENCOUNTER — Telehealth: Payer: Self-pay | Admitting: Orthopedic Surgery

## 2014-06-15 NOTE — Telephone Encounter (Signed)
Patient states has received MRI results yesterday by phone, and has called back to our office about a work status note.  She has no follow up appointment scheduled.  Please advise regarding whether to continue out of work. Pt phone#[phone]

## 2014-06-16 ENCOUNTER — Other Ambulatory Visit: Payer: Self-pay | Admitting: *Deleted

## 2014-06-16 ENCOUNTER — Telehealth: Payer: Self-pay | Admitting: *Deleted

## 2014-06-16 DIAGNOSIS — M7581 Other shoulder lesions, right shoulder: Secondary | ICD-10-CM

## 2014-06-16 NOTE — Progress Notes (Signed)
PATIENT AWARE AND ORDER FAXED TO [id number]

## 2014-06-16 NOTE — Telephone Encounter (Signed)
CALLED DR Arnoldo Morale OFFICE REGARDING REFERRAL FOR RIGHT C SPINE MASS, SPOKE WITH KELLY, WAS ADVISED WITH NO NOTES FROM OUR OFFICE PATIENT WILL NEED A WORK UP AND REFERRAL FROM PCP  CALLED PATIENT AND ADVISED AND SHE AGREES TO SEE PCP

## 2014-06-17 NOTE — Telephone Encounter (Signed)
Let's talk about this face-to-face on Monday morning on not sure what the phone note indicated

## 2014-06-19 ENCOUNTER — Encounter: Payer: Self-pay | Admitting: Orthopedic Surgery

## 2014-06-19 NOTE — Telephone Encounter (Signed)
Per Dr Ruthe Mannan staff message note 06/19/14, out of work X4 weeks.  Note entered; called patient, left voice message to return call regarding work note.  I had also contacted [employer], short-term disability insurer, 06/16/14; they are aware that an updated note is to follow via fax.

## 2014-06-20 NOTE — Telephone Encounter (Signed)
Received call back, response to voice message left 06/19/14. Discussed work note with patient. Faxed to [facility] with updated medical records/"ROI" request.

## 2014-06-23 ENCOUNTER — Other Ambulatory Visit (HOSPITAL_COMMUNITY): Payer: Self-pay | Admitting: Nurse Practitioner

## 2014-06-23 ENCOUNTER — Encounter (HOSPITAL_COMMUNITY): Payer: Self-pay

## 2014-06-23 ENCOUNTER — Ambulatory Visit (HOSPITAL_COMMUNITY)
Admission: RE | Admit: 2014-06-23 | Discharge: 2014-06-23 | Disposition: A | Discharge status: Home / Self Care | Payer: BLUE CROSS/BLUE SHIELD | Source: Ambulatory Visit | Attending: Nurse Practitioner | Admitting: Nurse Practitioner

## 2014-06-23 DIAGNOSIS — R599 Enlarged lymph nodes, unspecified: Secondary | ICD-10-CM | POA: Insufficient documentation

## 2014-06-23 DIAGNOSIS — Z9889 Other specified postprocedural states: Secondary | ICD-10-CM | POA: Insufficient documentation

## 2014-06-23 DIAGNOSIS — R590 Localized enlarged lymph nodes: Secondary | ICD-10-CM

## 2014-06-23 MED ORDER — IOHEXOL 300 MG/ML  SOLN
80.0000 mL | Freq: Once | INTRAMUSCULAR | Status: AC | PRN
  Administered 2014-06-23: 80 mL via INTRAVENOUS

## 2014-07-04 ENCOUNTER — Telehealth: Payer: Self-pay | Admitting: Orthopedic Surgery

## 2014-07-04 NOTE — Telephone Encounter (Signed)
Routing to Dr Aline Brochure  Extend PT?  Extend work note?

## 2014-07-04 NOTE — Telephone Encounter (Signed)
Patient called to relay she's been to [facility] as per recent orders; said that therapist recommended extending her visits out, since she will have to progress more slowly due to mass on her neck.  Patient said she has the referral appointment scheduled with Dr Arnoldo Morale for 07/20/14.  Can we also extend her out of work note 2 more days until that appointment?  Her ph# is [phone].

## 2014-07-05 ENCOUNTER — Other Ambulatory Visit: Payer: Self-pay | Admitting: *Deleted

## 2014-07-05 ENCOUNTER — Encounter: Payer: Self-pay | Admitting: Orthopedic Surgery

## 2014-07-05 DIAGNOSIS — M7581 Other shoulder lesions, right shoulder: Principal | ICD-10-CM

## 2014-07-05 DIAGNOSIS — M778 Other enthesopathies, not elsewhere classified: Secondary | ICD-10-CM

## 2014-07-05 NOTE — Telephone Encounter (Signed)
Extended PT order printed plz fax to [facility] and provide extended work note, thank you

## 2014-07-05 NOTE — Telephone Encounter (Signed)
yes

## 2014-07-05 NOTE — Telephone Encounter (Signed)
Done/patient aware. 

## 2014-07-18 ENCOUNTER — Telehealth: Payer: Self-pay | Admitting: Orthopedic Surgery

## 2014-07-18 NOTE — Telephone Encounter (Signed)
Called back to patient.  Aware to see Dr Arnoldo Morale as scheduled.

## 2014-07-18 NOTE — Telephone Encounter (Signed)
Patient called back to relay that her right shoulder is starting to hurt again.  States her appointment with (referral) Dr. Arnoldo Morale is this Thursday, 07/20/14. Said she will check with their office in event they may have a cancellation and see her prior to this date, but is asking if she should schedule with Dr Aline Brochure again, as well.  Her MRI was 06/14/14, and she received results, and also return to work note for 07/20/14.  Please advise patient.  Her ph# is [phone]

## 2014-07-18 NOTE — Telephone Encounter (Signed)
Routing to Dr Harrison 

## 2014-07-18 NOTE — Telephone Encounter (Signed)
Schedule with me for???? She s been referred

## 2014-07-18 NOTE — Telephone Encounter (Signed)
SHE WAS REFERRED OUT FOR CERVICAL MASS NOT SHOULDER, SHE HAS BEEN SENT TO PT ONLY FOR SHOULDER

## 2014-07-20 ENCOUNTER — Telehealth: Payer: Self-pay | Admitting: Orthopedic Surgery

## 2014-07-20 NOTE — Telephone Encounter (Signed)
Patient has called back following her referral appointment with Dr. Arnoldo Morale today, 07/20/14, and has further questions based on his findings *  * Dr Arnoldo Morale report has been received and is in Dr Ruthe Mannan box for review.  Patient is asking, based on Dr Arnoldo Morale' assessment that the problem is "musculosketal in nature", and there is no indication for surgery, and no mass present"; therefore, he recommended continuing physical therapy.  Patient feels she cannot return to present job duties 07/21/14 as per most recent note from our office.  Is there any further advice, referral, recommendation, or further work note addition?  Patient's ph# is [phone]

## 2014-07-20 NOTE — Telephone Encounter (Signed)
Mri shows no tear   rec PT/OT x 6 weeks   OOW 6 weeks then go back to work

## 2014-07-21 ENCOUNTER — Encounter: Payer: Self-pay | Admitting: Orthopedic Surgery

## 2014-07-21 NOTE — Telephone Encounter (Signed)
Called patient and relayed the information as per Dr Ruthe Mannan response.  Updated work note created and forwarded to her employer's short-term disability provider, [employer].  Authorization/release of information form on file.

## 2014-08-09 ENCOUNTER — Encounter: Payer: Self-pay | Admitting: Gastroenterology

## 2014-08-09 ENCOUNTER — Ambulatory Visit (INDEPENDENT_AMBULATORY_CARE_PROVIDER_SITE_OTHER): Payer: BLUE CROSS/BLUE SHIELD | Admitting: Gastroenterology

## 2014-08-09 ENCOUNTER — Other Ambulatory Visit: Payer: Self-pay

## 2014-08-09 VITALS — BP 112/75 | HR 72 | Temp 97.5°F | Ht 65.0 in | Wt 167.6 lb

## 2014-08-09 DIAGNOSIS — R110 Nausea: Secondary | ICD-10-CM

## 2014-08-09 MED ORDER — PROMETHAZINE HCL 25 MG PO TABS: 12.5000 mg | ORAL_TABLET | Freq: Four times a day (QID) | ORAL | Status: DC | PRN

## 2014-08-09 MED ORDER — PANTOPRAZOLE SODIUM 40 MG PO TBEC
40.0000 mg | DELAYED_RELEASE_TABLET | Freq: Every day | ORAL | Status: DC
Start: 2014-08-09 — End: 2016-04-02

## 2014-08-09 NOTE — Progress Notes (Signed)
Referring Provider: Florina Ou, MD Primary Care Physician:  Florina Ou, MD Primary GI: Dr. Gala Romney   Chief Complaint  Patient presents with  . Nausea  . dyspepsia    HPI:   Carol [name] is a 54 y.o. female presenting today with a history of dyspepsia and nausea. Recent EGD on file with non-critical Schatzki's ring, erosive reflux esophagitis, gastric and duodenal erosions. Negative H.pylori. Colonic adenoma and diverticulosis on colonoscopy. Due for surveillance in 5 years.   Done with course of prednisone. Taking tylenol #3 for shoulder. Nausea has worsened since last appointment. Took last Zofran yesterday. Zofran too expensive. Felt like she was going to vomit yesterday but didn't. Has been coughing a lot. Prilosec once a day. Nausea usually mostly later in day.   Having lots of issues with shoulder and neck. States she has something "popped up in her neck". CT negative.   Past Medical History  Diagnosis Date  . Diverticula of colon   . Ovarian cyst   . Renal disorder     kidney stones  . Chronic nausea     Past Surgical History  Procedure Laterality Date  . Cholecystectomy  1997  . Appendectomy  2010  . Tubal ligation    . Kidney stone surgery    . Breast lumpectomy      papilloma per pathology  . Colonoscopy  2010    repeat in 5 years; family hx colon ca and polypectomy  . Colonoscopy N/A 03/09/2014    Dr. Gala Romney: tubular adenoma, pancolonic diverticulosis. Surveillance due in 2021.   Marland Kitchen Esophagogastroduodenoscopy N/A 03/09/2014    Dr. Gala Romney: non-critical Schatzki's ring, not manipulated, mild erosive reflux esophagitis, gastric and duodenal erosions with negative H.pylori    Current Outpatient Prescriptions  Medication Sig Dispense Refill  . acetaminophen (TYLENOL) 500 MG tablet Take 500 mg by mouth every 6 (six) hours as needed for mild pain.    Marland Kitchen acetaminophen-codeine (TYLENOL #3) 300-30 MG per tablet Take 1 tablet by mouth every 4 (four) hours as needed for  moderate pain. 60 tablet 2  . omeprazole (PRILOSEC) 20 MG capsule Take 1 capsule (20 mg total) by mouth daily. (Patient taking differently: Take 20 mg by mouth daily as needed (acid reflux). ) 30 capsule 1  . ondansetron (ZOFRAN) 4 MG tablet Take 4 mg by mouth every 8 (eight) hours as needed for nausea or vomiting.    . promethazine (PHENERGAN) 25 MG tablet Take 0.5 tablets (12.5 mg total) by mouth every 6 (six) hours as needed for nausea or vomiting. May take an additional 1/2 tablet if needed. 30 tablet 1   No current facility-administered medications for this visit.    Allergies as of 08/09/2014 - Review Complete 08/09/2014  Allergen Reaction Noted  . Penicillins Shortness Of Breath and Swelling 05/27/2011  . Sulfa antibiotics  02/07/2014    Family History  Problem Relation Age of Onset  . Heart disease Father     Pacemaker  . CAD Mother 24    CABG  . Cancer Mother     Died age 23 of colon CA  . Atrial fibrillation Sister     History   Social History  . Marital Status: Married    Spouse Name: N/A  . Number of Children: 4  . Years of Education: N/A   Occupational History  .  [employer]   Social History Main Topics  . Smoking status: Current Every Day Smoker -- 1.00 packs/day for 35 years  Types: Cigarettes  . Smokeless tobacco: Never Used     Comment: Smokes about a pack a day  . Alcohol Use: 0.0 oz/week    0 Standard drinks or equivalent per week     Comment: socially/rarely  . Drug Use: No  . Sexual Activity: Not on file   Other Topics Concern  . None   Social History Narrative   Lives at home with husband    Review of Systems: As mentioned in HPI  Physical Exam: BP 112/75 mmHg  Pulse 72  Temp(Src) 97.5 F (36.4 C)  Ht 5\' 5"  (1.651 m)  Wt 167 lb 9.6 oz (76.023 kg)  BMI 27.89 kg/m2 General:   Alert and oriented. No distress noted. Pleasant and cooperative.  Head:  Normocephalic and atraumatic. Abdomen:  +BS, soft, non-tender and non-distended. No  rebound or guarding. No HSM or masses noted. Msk:  Symmetrical without gross deformities. Normal posture. Extremities:  Without edema. Neurologic:  Alert and  oriented x4;  grossly normal neurologically. Psych:  Alert and cooperative. Normal mood and affect.

## 2014-08-09 NOTE — Patient Instructions (Signed)
I have sent in phenergan to take as needed for nausea. Just take 1/2 tablet every 8 hours as needed. If needed, you could take a whole tablet, but watch for sedative effects.   I have ordered a gastric emptying study to see if your stomach is slow to empty.   I have also sent in Protonix to your pharmacy. See if this is less expensive than Prilosec. If it is, you can fill it. If not, continue with Prilosec daily.

## 2014-08-09 NOTE — Assessment & Plan Note (Signed)
54 year old female with chronic nausea, worsening since last appointment. EGD up-to-date. Avoiding NSAIDs, remains on Prilosec once daily. At this point, will proceed with GES for further evaluation of possible delayed gastric emptying.   GES in near future Protonix sent to pharmacy to see if less expensive than OTC Prilosec Phenergan 12.5 mg po as needed for nausea (Zofran too expensive) Further recommendations after GES

## 2014-08-10 NOTE — Progress Notes (Signed)
CC'ED TO PCP 

## 2014-08-15 ENCOUNTER — Encounter (HOSPITAL_COMMUNITY)
Admission: RE | Admit: 2014-08-15 | Discharge: 2014-08-15 | Disposition: A | Discharge status: Home / Self Care | Payer: BLUE CROSS/BLUE SHIELD | Source: Ambulatory Visit | Attending: Gastroenterology | Admitting: Gastroenterology

## 2014-08-15 ENCOUNTER — Encounter (HOSPITAL_COMMUNITY): Payer: Self-pay

## 2014-08-15 DIAGNOSIS — R110 Nausea: Secondary | ICD-10-CM | POA: Insufficient documentation

## 2014-08-15 DIAGNOSIS — K219 Gastro-esophageal reflux disease without esophagitis: Secondary | ICD-10-CM | POA: Insufficient documentation

## 2014-08-15 MED ORDER — TECHNETIUM TC 99M SULFUR COLLOID
2.0000 | Freq: Once | INTRAVENOUS | Status: AC | PRN
  Administered 2014-08-15: 2 via ORAL

## 2014-08-23 NOTE — Progress Notes (Signed)
Quick Note:  GES is normal. Her weight is increased from last visit. How is she feeling with Phenergan? ______

## 2014-09-12 ENCOUNTER — Other Ambulatory Visit (HOSPITAL_COMMUNITY): Payer: Self-pay | Admitting: Rehabilitation

## 2014-09-12 DIAGNOSIS — M5030 Other cervical disc degeneration, unspecified cervical region: Secondary | ICD-10-CM

## 2014-09-19 NOTE — Progress Notes (Signed)
Quick Note:  Ok. Let's have her come back in 3 months. ______

## 2014-09-20 ENCOUNTER — Encounter: Payer: Self-pay | Admitting: Internal Medicine

## 2014-09-20 NOTE — Progress Notes (Signed)
APPOINTMENT MADE AND LETTER SENT °

## 2014-09-21 ENCOUNTER — Ambulatory Visit (HOSPITAL_COMMUNITY)
Admission: RE | Admit: 2014-09-21 | Discharge: 2014-09-21 | Disposition: A | Discharge status: Home / Self Care | Payer: BLUE CROSS/BLUE SHIELD | Source: Ambulatory Visit | Attending: Rehabilitation | Admitting: Rehabilitation

## 2014-09-21 DIAGNOSIS — M25511 Pain in right shoulder: Secondary | ICD-10-CM | POA: Insufficient documentation

## 2014-09-21 DIAGNOSIS — M47892 Other spondylosis, cervical region: Secondary | ICD-10-CM | POA: Insufficient documentation

## 2014-09-21 DIAGNOSIS — R200 Anesthesia of skin: Secondary | ICD-10-CM | POA: Diagnosis not present

## 2014-09-21 DIAGNOSIS — M542 Cervicalgia: Secondary | ICD-10-CM | POA: Insufficient documentation

## 2014-09-21 DIAGNOSIS — M5030 Other cervical disc degeneration, unspecified cervical region: Secondary | ICD-10-CM

## 2014-10-19 ENCOUNTER — Telehealth: Payer: Self-pay | Admitting: Orthopedic Surgery

## 2014-10-19 NOTE — Telephone Encounter (Signed)
No  shehad mri of the cervcal spine and needs to Dr Francesco Runner or Neurosurgery

## 2014-10-19 NOTE — Telephone Encounter (Signed)
Carol [name] called and stated that her Rt Shoulder is not any better at all, she stated that its actually gotten worse and wanted to make an appointment to come back here to the office. Do I schedule her back here again, please advise?

## 2014-10-19 NOTE — Telephone Encounter (Signed)
OK NOTED, ROUTING TO JAIME

## 2014-10-24 ENCOUNTER — Other Ambulatory Visit: Payer: Self-pay

## 2014-10-24 NOTE — Telephone Encounter (Signed)
Called patient, no answer, left vm 

## 2014-10-25 MED ORDER — PROMETHAZINE HCL 25 MG PO TABS: 12.5000 mg | ORAL_TABLET | Freq: Four times a day (QID) | ORAL | Status: DC | PRN

## 2014-10-25 NOTE — Telephone Encounter (Signed)
Patient aware and declines referral to neurosurgery states she has already been seen [facility]

## 2014-11-27 ENCOUNTER — Other Ambulatory Visit: Payer: Self-pay

## 2014-11-27 MED ORDER — PROMETHAZINE HCL 25 MG PO TABS: 12.5000 mg | ORAL_TABLET | Freq: Four times a day (QID) | ORAL | Status: DC | PRN

## 2014-12-21 ENCOUNTER — Ambulatory Visit: Payer: BLUE CROSS/BLUE SHIELD | Admitting: Gastroenterology

## 2015-01-03 ENCOUNTER — Ambulatory Visit: Payer: BLUE CROSS/BLUE SHIELD | Admitting: Gastroenterology

## 2015-01-30 ENCOUNTER — Ambulatory Visit: Payer: BLUE CROSS/BLUE SHIELD | Admitting: Gastroenterology

## 2015-02-14 ENCOUNTER — Ambulatory Visit: Payer: BLUE CROSS/BLUE SHIELD | Admitting: Gastroenterology

## 2015-02-25 ENCOUNTER — Encounter (HOSPITAL_COMMUNITY): Payer: Self-pay | Admitting: Emergency Medicine

## 2015-02-25 ENCOUNTER — Emergency Department (HOSPITAL_COMMUNITY): Payer: BLUE CROSS/BLUE SHIELD

## 2015-02-25 ENCOUNTER — Emergency Department (HOSPITAL_COMMUNITY)
Admission: EM | Admit: 2015-02-25 | Discharge: 2015-02-25 | Disposition: A | Discharge status: Home / Self Care | Payer: BLUE CROSS/BLUE SHIELD | Attending: Emergency Medicine | Admitting: Emergency Medicine

## 2015-02-25 DIAGNOSIS — Z87448 Personal history of other diseases of urinary system: Secondary | ICD-10-CM | POA: Diagnosis not present

## 2015-02-25 DIAGNOSIS — Z8719 Personal history of other diseases of the digestive system: Secondary | ICD-10-CM | POA: Insufficient documentation

## 2015-02-25 DIAGNOSIS — J40 Bronchitis, not specified as acute or chronic: Secondary | ICD-10-CM

## 2015-02-25 DIAGNOSIS — Z880 Allergy status to penicillin: Secondary | ICD-10-CM | POA: Diagnosis not present

## 2015-02-25 DIAGNOSIS — J209 Acute bronchitis, unspecified: Secondary | ICD-10-CM | POA: Insufficient documentation

## 2015-02-25 DIAGNOSIS — Z8742 Personal history of other diseases of the female genital tract: Secondary | ICD-10-CM | POA: Insufficient documentation

## 2015-02-25 DIAGNOSIS — Z79899 Other long term (current) drug therapy: Secondary | ICD-10-CM | POA: Diagnosis not present

## 2015-02-25 DIAGNOSIS — J018 Other acute sinusitis: Secondary | ICD-10-CM | POA: Diagnosis not present

## 2015-02-25 DIAGNOSIS — F17210 Nicotine dependence, cigarettes, uncomplicated: Secondary | ICD-10-CM | POA: Insufficient documentation

## 2015-02-25 DIAGNOSIS — J329 Chronic sinusitis, unspecified: Secondary | ICD-10-CM

## 2015-02-25 DIAGNOSIS — R195 Other fecal abnormalities: Secondary | ICD-10-CM | POA: Diagnosis not present

## 2015-02-25 DIAGNOSIS — R05 Cough: Secondary | ICD-10-CM | POA: Diagnosis present

## 2015-02-25 MED ORDER — ALBUTEROL SULFATE HFA 108 (90 BASE) MCG/ACT IN AERS
2.0000 | INHALATION_SPRAY | Freq: Once | RESPIRATORY_TRACT | Status: AC
  Administered 2015-02-25: 2 via RESPIRATORY_TRACT
  Filled 2015-02-25: qty 6.7

## 2015-02-25 MED ORDER — DOXYCYCLINE HYCLATE 100 MG PO TABS
100.0000 mg | ORAL_TABLET | Freq: Once | ORAL | Status: AC
  Administered 2015-02-25: 100 mg via ORAL
  Filled 2015-02-25: qty 1

## 2015-02-25 MED ORDER — DEXAMETHASONE 4 MG PO TABS
4.0000 mg | ORAL_TABLET | Freq: Two times a day (BID) | ORAL | Status: DC
Start: 2015-02-25 — End: 2016-04-02

## 2015-02-25 MED ORDER — PREDNISONE 50 MG PO TABS
60.0000 mg | ORAL_TABLET | Freq: Once | ORAL | Status: AC
  Administered 2015-02-25: 60 mg via ORAL
  Filled 2015-02-25: qty 1

## 2015-02-25 MED ORDER — LORATADINE-PSEUDOEPHEDRINE ER 5-120 MG PO TB12: 1.0000 | ORAL_TABLET | Freq: Two times a day (BID) | ORAL | Status: DC

## 2015-02-25 MED ORDER — DOXYCYCLINE HYCLATE 100 MG PO CAPS: 100.0000 mg | ORAL_CAPSULE | Freq: Two times a day (BID) | ORAL | Status: DC

## 2015-02-25 NOTE — ED Notes (Signed)
resp therapy contacted re- Insentive spirometer - stated that she would be down ASAP

## 2015-02-25 NOTE — ED Provider Notes (Signed)
CSN: [id number]     Arrival date & time 02/25/15  1249 History   First MD Initiated Contact with Patient 02/25/15 1407     Chief Complaint  Patient presents with  . Cough     (Consider location/radiation/quality/duration/timing/severity/associated sxs/prior Treatment) HPI Comments: Patient is a 55 year old female smoker who presents to the emergency department with a complaint of cough and congestion.  Patient states that over the last for 5 days she has been sick. She started out with congestion in her nasal passages. She now has a nonproductive cough, right ear pain, and some body aches. She thinks that she has had some fever, but has not measured a temperature at home. She's been trying Mucinex and Delsym, but these have not provided relief. Patient has not noticed any unusual rash. She has abdominal soreness, she states however that she has irritation of her stomach, and diverticula of her colon. She thinks that she saw small amount of blood in her stool earlier today, but has not had any since early this morning. And has not had any prior to this morning.  Patient is a 55 y.o. female presenting with cough. The history is provided by the patient.  Cough Associated symptoms: ear pain and wheezing     Past Medical History  Diagnosis Date  . Diverticula of colon   . Ovarian cyst   . Renal disorder     kidney stones  . Chronic nausea    Past Surgical History  Procedure Laterality Date  . Cholecystectomy  1997  . Appendectomy  2010  . Tubal ligation    . Kidney stone surgery    . Breast lumpectomy      papilloma per pathology  . Colonoscopy  2010    repeat in 5 years; family hx colon ca and polypectomy  . Colonoscopy N/A 03/09/2014    Dr. Gala Romney: tubular adenoma, pancolonic diverticulosis. Surveillance due in 2021.   Marland Kitchen Esophagogastroduodenoscopy N/A 03/09/2014    Dr. Gala Romney: non-critical Schatzki's ring, not manipulated, mild erosive reflux esophagitis, gastric and duodenal erosions  with negative H.pylori   Family History  Problem Relation Age of Onset  . Heart disease Father     Pacemaker  . CAD Mother 64    CABG  . Cancer Mother     Died age 74 of colon CA  . Atrial fibrillation Sister    Social History  Substance Use Topics  . Smoking status: Current Every Day Smoker -- 1.00 packs/day for 35 years    Types: Cigarettes  . Smokeless tobacco: Never Used     Comment: Smokes about a pack a day  . Alcohol Use: 0.0 oz/week    0 Standard drinks or equivalent per week     Comment: socially/rarely   OB History    Gravida Para Term Preterm AB TAB SAB Ectopic Multiple Living            4     Review of Systems  HENT: Positive for congestion and ear pain.   Respiratory: Positive for cough and wheezing.   Gastrointestinal: Positive for blood in stool.  All other systems reviewed and are negative.     Allergies  Penicillins and Sulfa antibiotics  Home Medications   Prior to Admission medications   Medication Sig Start Date End Date Taking? Authorizing Provider  acetaminophen (TYLENOL) 500 MG tablet Take 500 mg by mouth every 6 (six) hours as needed for mild pain.    Historical Provider, MD  acetaminophen-codeine (TYLENOL #3)  300-30 MG per tablet Take 1 tablet by mouth every 4 (four) hours as needed for moderate pain. 05/30/14   Carole Civil, MD  omeprazole (PRILOSEC) 20 MG capsule Take 1 capsule (20 mg total) by mouth daily. Patient taking differently: Take 20 mg by mouth daily as needed (acid reflux).  02/07/14   Carlis Stable, NP  ondansetron (ZOFRAN) 4 MG tablet Take 4 mg by mouth every 8 (eight) hours as needed for nausea or vomiting.    Historical Provider, MD  pantoprazole (PROTONIX) 40 MG tablet Take 1 tablet (40 mg total) by mouth daily. 08/09/14   Orvil Feil, NP  promethazine (PHENERGAN) 25 MG tablet Take 0.5 tablets (12.5 mg total) by mouth every 6 (six) hours as needed for nausea or vomiting. May take an additional 1/2 tablet if needed. 11/27/14    Mahala Menghini, PA-C   BP 124/81 mmHg  Pulse 75  Temp(Src) 97.8 F (36.6 C) (Oral)  Resp 20  Ht 5\' 5"  (1.651 m)  Wt 77.111 kg  BMI 28.29 kg/m2  SpO2 96% Physical Exam  Constitutional: She is oriented to person, place, and time. She appears well-developed and well-nourished.  Non-toxic appearance.  HENT:  Head: Normocephalic.  Right Ear: Tympanic membrane and external ear normal.  Left Ear: Tympanic membrane and external ear normal.  Nasal congestion present. Pain to percussion of the right maxillary sinus. There is increased redness of the posterior pharynx, no exudate noted. Uvula is slightly enlarged, but midline. The airway is patent.  Eyes: EOM and lids are normal. Pupils are equal, round, and reactive to light.  Neck: Normal range of motion. Neck supple. Carotid bruit is not present.  Cardiovascular: Normal rate, regular rhythm, normal heart sounds, intact distal pulses and normal pulses.   Pulmonary/Chest: No respiratory distress. She has wheezes. She has rhonchi.  Abdominal: Soft. Bowel sounds are normal. There is no tenderness. There is no guarding.  There is mild diffuse soreness present. Patient states that she usually has this soreness, it has been aggravated by her coughing. No mass appreciated. No guarding noted.  Musculoskeletal: Normal range of motion.  Lymphadenopathy:       Head (right side): No submandibular adenopathy present.       Head (left side): No submandibular adenopathy present.    She has no cervical adenopathy.  Neurological: She is alert and oriented to person, place, and time. She has normal strength. No cranial nerve deficit or sensory deficit.  Skin: Skin is warm and dry.  Psychiatric: She has a normal mood and affect. Her speech is normal.  Nursing note and vitals reviewed.   ED Course  Procedures (including critical care time) Labs Review Labs Reviewed - No data to display  Imaging Review Dg Chest 2 View  02/25/2015  CLINICAL DATA:  Cough,  slightly productive. Shortness of breath, wheezing, upper back pain for 4 days. EXAM: CHEST  2 VIEW COMPARISON:  Chest x-ray dated 05/20/2012. FINDINGS: The heart size and mediastinal contours are within normal limits. Both lungs are clear. The visualized skeletal structures are unremarkable. IMPRESSION: Lungs are clear and there is no evidence of acute cardiopulmonary abnormality. No evidence of pneumonia. Electronically Signed   By: Franki Cabot M.D.   On: 02/25/2015 14:12   I have personally reviewed and evaluated these images and lab results as part of my medical decision-making.   EKG Interpretation None      MDM  Vital signs are well within normal limits. Pulse oximetry is  96% on room air.  Chest x-ray is negative for pneumonia, or other acute cardiopulmonary abnormalities. The examination suggest sinusitis and bronchitis. The patient will be treated with steroids, albuterol inhaler, and antibiotics.    Final diagnoses:  None    **I have reviewed nursing notes, vital signs, and all appropriate lab and imaging results for this patient.Lily Kocher, PA-C 02/25/15 1434  Harvel Quale, MD 03/04/15 [phone]

## 2015-02-25 NOTE — Discharge Instructions (Signed)
Please increase fluids. Please stop smoking. Used to puffs of albuterol every 4 hours for wheezing, or difficulty with breathing. Use Claritin-D, decadron,  and doxycycline 2 times daily or every 12 hours. If you should notice blood in your stool again, please see your gastroenterologist as we discussed.

## 2015-02-25 NOTE — ED Notes (Signed)
PT c/o nonproductive cough, nasal congestion and pressure to right ear x4 days and has been taking mucinex and delsym at home with no relief.

## 2015-08-11 ENCOUNTER — Emergency Department (HOSPITAL_COMMUNITY): Payer: BLUE CROSS/BLUE SHIELD

## 2015-08-11 ENCOUNTER — Encounter (HOSPITAL_COMMUNITY): Payer: Self-pay | Admitting: *Deleted

## 2015-08-11 ENCOUNTER — Emergency Department (HOSPITAL_COMMUNITY)
Admission: EM | Admit: 2015-08-11 | Discharge: 2015-08-11 | Disposition: A | Discharge status: Home / Self Care | Payer: BLUE CROSS/BLUE SHIELD | Attending: Emergency Medicine | Admitting: Emergency Medicine

## 2015-08-11 DIAGNOSIS — Z79899 Other long term (current) drug therapy: Secondary | ICD-10-CM | POA: Insufficient documentation

## 2015-08-11 DIAGNOSIS — R319 Hematuria, unspecified: Secondary | ICD-10-CM | POA: Insufficient documentation

## 2015-08-11 DIAGNOSIS — R1030 Lower abdominal pain, unspecified: Secondary | ICD-10-CM

## 2015-08-11 DIAGNOSIS — F17210 Nicotine dependence, cigarettes, uncomplicated: Secondary | ICD-10-CM | POA: Diagnosis not present

## 2015-08-11 DIAGNOSIS — R300 Dysuria: Secondary | ICD-10-CM | POA: Insufficient documentation

## 2015-08-11 LAB — CBC WITH DIFFERENTIAL/PLATELET
Basophils Absolute: 0 10*3/uL (ref 0.0–0.1)
Basophils Relative: 0 %
Eosinophils Absolute: 0.1 10*3/uL (ref 0.0–0.7)
Eosinophils Relative: 1 %
HCT: 42.4 % (ref 36.0–46.0)
Hemoglobin: 14.3 g/dL (ref 12.0–15.0)
Lymphocytes Relative: 25 %
Lymphs Abs: 2.2 10*3/uL (ref 0.7–4.0)
MCH: 30.6 pg (ref 26.0–34.0)
MCHC: 33.7 g/dL (ref 30.0–36.0)
MCV: 90.6 fL (ref 78.0–100.0)
Monocytes Absolute: 0.7 10*3/uL (ref 0.1–1.0)
Monocytes Relative: 9 %
Neutro Abs: 5.6 10*3/uL (ref 1.7–7.7)
Neutrophils Relative %: 65 %
Platelets: 246 10*3/uL (ref 150–400)
RBC: 4.68 MIL/uL (ref 3.87–5.11)
RDW: 11.4 % — ABNORMAL LOW (ref 11.5–15.5)
WBC: 8.6 10*3/uL (ref 4.0–10.5)

## 2015-08-11 LAB — COMPREHENSIVE METABOLIC PANEL
ALT: 18 U/L (ref 14–54)
AST: 19 U/L (ref 15–41)
Albumin: 4.1 g/dL (ref 3.5–5.0)
Alkaline Phosphatase: 72 U/L (ref 38–126)
Anion gap: 7 (ref 5–15)
BUN: 16 mg/dL (ref 6–20)
CO2: 28 mmol/L (ref 22–32)
Calcium: 9 mg/dL (ref 8.9–10.3)
Chloride: 103 mmol/L (ref 101–111)
Creatinine, Ser: 0.66 mg/dL (ref 0.44–1.00)
GFR calc Af Amer: >60 mL/min (ref >60)
GFR calc non Af Amer: >60 mL/min (ref >60)
Glucose, Bld: 93 mg/dL (ref 65–99)
Potassium: 4 mmol/L (ref 3.5–5.1)
Sodium: 138 mmol/L (ref 135–145)
Total Bilirubin: 0.8 mg/dL (ref 0.3–1.2)
Total Protein: 6.9 g/dL (ref 6.5–8.1)

## 2015-08-11 LAB — URINALYSIS, ROUTINE W REFLEX MICROSCOPIC
Bilirubin Urine: NEGATIVE
Glucose, UA: NEGATIVE mg/dL
Ketones, ur: NEGATIVE mg/dL
Leukocytes, UA: NEGATIVE
Nitrite: NEGATIVE
Protein, ur: NEGATIVE mg/dL
Specific Gravity, Urine: <1.005 — ABNORMAL LOW (ref 1.005–1.030)
pH: 6.5 (ref 5.0–8.0)

## 2015-08-11 LAB — URINE MICROSCOPIC-ADD ON

## 2015-08-11 LAB — WET PREP, GENITAL
Clue Cells Wet Prep HPF POC: NONE SEEN
Sperm: NONE SEEN
Trich, Wet Prep: NONE SEEN
Yeast Wet Prep HPF POC: NONE SEEN

## 2015-08-11 MED ORDER — IOPAMIDOL (ISOVUE-300) INJECTION 61%
100.0000 mL | Freq: Once | INTRAVENOUS | Status: AC | PRN
  Administered 2015-08-11: 100 mL via INTRAVENOUS

## 2015-08-11 MED ORDER — ONDANSETRON HCL 4 MG/2ML IJ SOLN
4.0000 mg | Freq: Three times a day (TID) | INTRAMUSCULAR | Status: DC
  Administered 2015-08-11: 4 mg via INTRAVENOUS
  Filled 2015-08-11: qty 2

## 2015-08-11 MED ORDER — HYDROMORPHONE HCL 1 MG/ML IJ SOLN
1.0000 mg | Freq: Once | INTRAMUSCULAR | Status: AC
  Administered 2015-08-11: 1 mg via INTRAVENOUS
  Filled 2015-08-11: qty 1

## 2015-08-11 MED ORDER — HYDROMORPHONE HCL 2 MG/ML IJ SOLN
2.0000 mg | Freq: Once | INTRAMUSCULAR | Status: DC
  Filled 2015-08-11: qty 1

## 2015-08-11 MED ORDER — DICYCLOMINE HCL 20 MG PO TABS: 20.0000 mg | ORAL_TABLET | Freq: Two times a day (BID) | ORAL | Status: DC

## 2015-08-11 MED ORDER — DIATRIZOATE MEGLUMINE & SODIUM 66-10 % PO SOLN
ORAL | Status: AC
  Administered 2015-08-11: 30 mL
  Filled 2015-08-11: qty 30

## 2015-08-11 NOTE — Discharge Instructions (Signed)
Abdominal Pain, Adult °Many things can cause abdominal pain. Usually, abdominal pain is not caused by a disease and will improve without treatment. It can often be observed and treated at home. Your health care provider will do a physical exam and possibly order blood tests and X-rays to help determine the seriousness of your pain. However, in many cases, more time must pass before a clear cause of the pain can be found. Before that point, your health care provider may not know if you need more testing or further treatment. °HOME CARE INSTRUCTIONS °Monitor your abdominal pain for any changes. The following actions may help to alleviate any discomfort you are experiencing: °· Only take over-the-counter or prescription medicines as directed by your health care provider. °· Do not take laxatives unless directed to do so by your health care provider. °· Try a clear liquid diet (broth, tea, or water) as directed by your health care provider. Slowly move to a bland diet as tolerated. °SEEK MEDICAL CARE IF: °· You have unexplained abdominal pain. °· You have abdominal pain associated with nausea or diarrhea. °· You have pain when you urinate or have a bowel movement. °· You experience abdominal pain that wakes you in the night. °· You have abdominal pain that is worsened or improved by eating food. °· You have abdominal pain that is worsened with eating fatty foods. °· You have a fever. °SEEK IMMEDIATE MEDICAL CARE IF: °· Your pain does not go away within 2 hours. °· You keep throwing up (vomiting). °· Your pain is felt only in portions of the abdomen, such as the right side or the left lower portion of the abdomen. °· You pass bloody or black tarry stools. °MAKE SURE YOU: °· Understand these instructions. °· Will watch your condition. °· Will get help right away if you are not doing well or get worse. °  °This information is not intended to replace advice given to you by your health care provider. Make sure you discuss  any questions you have with your health care provider. °  °Document Released: 11/13/2004 Document Revised: 10/25/2014 Document Reviewed: 10/13/2012 °Elsevier Interactive Patient Education ©2016 [employer] ° °Diverticulosis °Diverticulosis is the condition that develops when small pouches (diverticula) form in the wall of your colon. Your colon, or large intestine, is where water is absorbed and stool is formed. The pouches form when the inside layer of your colon pushes through weak spots in the outer layers of your colon. °CAUSES  °No one knows exactly what causes diverticulosis. °RISK FACTORS °· Being older than 50. Your risk for this condition increases with age. Diverticulosis is rare in people younger than 40 years. By age 80, almost everyone has it. °· Eating a low-fiber diet. °· Being frequently constipated. °· Being overweight. °· Not getting enough exercise. °· Smoking. °· Taking over-the-counter pain medicines, like aspirin and ibuprofen. °SYMPTOMS  °Most people with diverticulosis do not have symptoms. °DIAGNOSIS  °Because diverticulosis often has no symptoms, health care providers often discover the condition during an exam for other colon problems. In many cases, a health care provider will diagnose diverticulosis while using a flexible scope to examine the colon (colonoscopy). °TREATMENT  °If you have never developed an infection related to diverticulosis, you may not need treatment. If you have had an infection before, treatment may include: °· Eating more fruits, vegetables, and grains. °· Taking a fiber supplement. °· Taking a live bacteria supplement (probiotic). °· Taking medicine to relax your colon. °HOME CARE   Drink at least 6-8 glasses of water each day to prevent constipation.  Try not to strain when you have a bowel movement.  Keep all follow-up appointments. If you have had an infection before:  Increase the fiber in your diet as directed by your health care  provider or dietitian.  Take a dietary fiber supplement if your health care provider approves.  Only take medicines as directed by your health care provider. SEEK MEDICAL CARE IF:   You have abdominal pain.  You have bloating.  You have cramps.  You have not gone to the bathroom in 3 days. SEEK IMMEDIATE MEDICAL CARE IF:   Your pain gets worse.  Yourbloating becomes very bad.  You have a fever or chills, and your symptoms suddenly get worse.  You begin vomiting.  You have bowel movements that are bloody or black. MAKE SURE YOU:  Understand these instructions.  Will watch your condition.  Will get help right away if you are not doing well or get worse.   This information is not intended to replace advice given to you by your health care provider. Make sure you discuss any questions you have with your health care provider.   Document Released: 11/01/2003 Document Revised: 02/08/2013 Document Reviewed: 12/29/2012 Elsevier Interactive Patient Education [employer].

## 2015-08-11 NOTE — ED Notes (Signed)
Pt comes in with lower abdominal pain and painful urination. Pt denies any n/v/d.

## 2015-08-11 NOTE — ED Provider Notes (Signed)
CSN: [id number]     Arrival date & time 08/11/15  0913 History   First MD Initiated Contact with Patient 08/11/15 [phone]     Chief Complaint  Patient presents with  . Abdominal Pain     (Consider location/radiation/quality/duration/timing/severity/associated sxs/prior Treatment) Patient is a 55 y.o. female presenting with abdominal pain. The history is provided by the patient. No language interpreter was used.  Abdominal Pain Pain location:  Suprapubic Pain quality: aching   Pain radiates to:  Does not radiate Pain severity:  Moderate Onset quality:  Gradual Duration:  3 days Timing:  Constant Progression:  Worsening Chronicity:  New Relieved by:  Nothing Associated symptoms: dysuria and hematuria   Associated symptoms: no nausea and no vaginal discharge   Risk factors: not pregnant     Past Medical History  Diagnosis Date  . Diverticula of colon   . Ovarian cyst   . Renal disorder     kidney stones  . Chronic nausea    Past Surgical History  Procedure Laterality Date  . Cholecystectomy  1997  . Appendectomy  2010  . Tubal ligation    . Kidney stone surgery    . Breast lumpectomy      papilloma per pathology  . Colonoscopy  2010    repeat in 5 years; family hx colon ca and polypectomy  . Colonoscopy N/A 03/09/2014    Dr. Gala Romney: tubular adenoma, pancolonic diverticulosis. Surveillance due in 2021.   Marland Kitchen Esophagogastroduodenoscopy N/A 03/09/2014    Dr. Gala Romney: non-critical Schatzki's ring, not manipulated, mild erosive reflux esophagitis, gastric and duodenal erosions with negative H.pylori  . Shoulder surgery     Family History  Problem Relation Age of Onset  . Heart disease Father     Pacemaker  . CAD Mother 62    CABG  . Cancer Mother     Died age 48 of colon CA  . Atrial fibrillation Sister    Social History  Substance Use Topics  . Smoking status: Current Every Day Smoker -- 1.00 packs/day for 35 years    Types: Cigarettes  . Smokeless tobacco: Never Used      Comment: Smokes about a pack a day  . Alcohol Use: 0.0 oz/week    0 Standard drinks or equivalent per week     Comment: socially/rarely   OB History    Gravida Para Term Preterm AB TAB SAB Ectopic Multiple Living            4     Review of Systems  Gastrointestinal: Positive for abdominal pain. Negative for nausea.  Genitourinary: Positive for dysuria and hematuria. Negative for vaginal discharge.  All other systems reviewed and are negative.     Allergies  Penicillins and Sulfa antibiotics  Home Medications   Prior to Admission medications   Medication Sig Start Date End Date Taking? Authorizing Provider  omeprazole (PRILOSEC) 20 MG capsule Take 1 capsule (20 mg total) by mouth daily. Patient taking differently: Take 20 mg by mouth daily as needed (acid reflux).  02/07/14  Yes Carlis Stable, NP  oxyCODONE-acetaminophen (PERCOCET/ROXICET) 5-325 MG tablet Take 1 tablet by mouth every 4 (four) hours as needed for moderate pain.   Yes Historical Provider, MD  acetaminophen (TYLENOL) 500 MG tablet Take 500 mg by mouth every 6 (six) hours as needed for mild pain.    Historical Provider, MD  acetaminophen-codeine (TYLENOL #3) 300-30 MG per tablet Take 1 tablet by mouth every 4 (four) hours as needed for  moderate pain. Patient not taking: Reported on 08/11/2015 05/30/14   Carole Civil, MD  dexamethasone (DECADRON) 4 MG tablet Take 1 tablet (4 mg total) by mouth 2 (two) times daily with a meal. Patient not taking: Reported on 08/11/2015 02/25/15   Lily Kocher, PA-C  doxycycline (VIBRAMYCIN) 100 MG capsule Take 1 capsule (100 mg total) by mouth 2 (two) times daily. Patient not taking: Reported on 08/11/2015 02/25/15   Lily Kocher, PA-C  loratadine-pseudoephedrine (CLARITIN-D 12 HOUR) 5-120 MG tablet Take 1 tablet by mouth 2 (two) times daily. Patient not taking: Reported on 08/11/2015 02/25/15   Lily Kocher, PA-C  pantoprazole (PROTONIX) 40 MG tablet Take 1 tablet (40 mg total) by  mouth daily. Patient not taking: Reported on 08/11/2015 08/09/14   Orvil Feil, NP  promethazine (PHENERGAN) 25 MG tablet Take 0.5 tablets (12.5 mg total) by mouth every 6 (six) hours as needed for nausea or vomiting. May take an additional 1/2 tablet if needed. 11/27/14   Mahala Menghini, PA-C   BP 124/77 mmHg  Pulse 68  Temp(Src) 97.9 F (36.6 C) (Oral)  Resp 16  Ht 5\' 5"  (1.651 m)  Wt 77.111 kg  BMI 28.29 kg/m2  SpO2 98% Physical Exam  Constitutional: She is oriented to person, place, and time. She appears well-developed and well-nourished.  HENT:  Head: Normocephalic.  Eyes: EOM are normal.  Neck: Normal range of motion.  Pulmonary/Chest: Effort normal.  Abdominal: She exhibits no distension.  Genitourinary: Vagina normal. No vaginal discharge found.  Pain with speculem exam Uterus and ovarys nontender  Musculoskeletal: Normal range of motion.  Neurological: She is alert and oriented to person, place, and time.  Psychiatric: She has a normal mood and affect.  Nursing note and vitals reviewed.   ED Course  Procedures (including critical care time) Labs Review Labs Reviewed  URINALYSIS, ROUTINE W REFLEX MICROSCOPIC (NOT AT [facility]) - Abnormal; Notable for the following:    Specific Gravity, Urine <1.005 (*)    Hgb urine dipstick TRACE (*)    All other components within normal limits  URINE MICROSCOPIC-ADD ON - Abnormal; Notable for the following:    Squamous Epithelial / LPF 0-5 (*)    Bacteria, UA RARE (*)    All other components within normal limits    Imaging Review Ct Abdomen Pelvis W Contrast  08/11/2015  CLINICAL DATA:  Lower abdominal pain, painful urination, history kidney stones, appendectomy, cholecystectomy, tubal ligation, ovarian cysts EXAM: CT ABDOMEN AND PELVIS WITH CONTRAST TECHNIQUE: Multidetector CT imaging of the abdomen and pelvis was performed using the standard protocol following bolus administration of intravenous contrast. Sagittal and coronal MPR  images reconstructed from axial data set. CONTRAST:  152mL ISOVUE-300 IOPAMIDOL (ISOVUE-300) INJECTION 61% IV. Dilute oral contrast. COMPARISON:  05/27/2011 FINDINGS: Lower chest:  Minimal bibasilar dependent atelectasis. Hepatobiliary: Gallbladder surgically absent. No biliary dilatation. Slight hypervascular focus in the LEFT lobe liver on the previous exam is less evident on current study question vascular anomaly. Liver otherwise normal appearance. Pancreas: Normal appearance Spleen: Normal appearance Adrenals/Urinary Tract: Adrenal glands normal appearance. Small nonobstructing calculus upper pole RIGHT kidney image 23. Kidneys otherwise normal appearance. No hydronephrosis or hydroureter. Bladder unremarkable. Stomach/Bowel: Appendix absent by history. Sigmoid diverticulosis without evidence of diverticulitis. Stomach and bowel loops otherwise normal appearance. Vascular/Lymphatic: Minimal atherosclerotic calcification aorta and iliac arteries. Vascular structures grossly patent on nondedicated exam. No adenopathy. Reproductive: Unremarkable uterus and adnexa Other: No free air, free fluid, mass or hernia. Musculoskeletal: Unremarkable osseous structures  IMPRESSION: Small nonobstructing RIGHT renal calculus. Post cholecystectomy and appendectomy. Potential vascular anomaly LEFT lobe liver on prior exam is less well visualized on current study. Sigmoid diverticulosis without evidence of diverticulitis. No acute intra-abdominal or intrapelvic process. Aortic atherosclerosis. Electronically Signed   By: Lavonia Dana M.D.   On: 08/11/2015 12:46   I have personally reviewed and evaluated these images and lab results as part of my medical decision-making.   EKG Interpretation None      MDM Pt has normal labs and normal urine.  No acute abnormality on ct scan.   Final diagnoses:  Lower abdominal pain    Pt's care turned over to [facility] NP with ct pending   Fransico Meadow, PA-C 08/11/15  [place], [place] 08/11/15 [place], PA-C 08/11/15 [place], [place] 08/11/15 1303  Ripley Fraise, MD 08/11/15 [phone]

## 2015-08-13 LAB — GC/CHLAMYDIA PROBE AMP (~~LOC~~) NOT AT ARMC
Chlamydia: NEGATIVE
Neisseria Gonorrhea: NEGATIVE

## 2015-11-04 ENCOUNTER — Emergency Department (HOSPITAL_COMMUNITY): Payer: BLUE CROSS/BLUE SHIELD

## 2015-11-04 ENCOUNTER — Emergency Department (HOSPITAL_COMMUNITY)
Admission: EM | Admit: 2015-11-04 | Discharge: 2015-11-04 | Disposition: A | Discharge status: Home / Self Care | Payer: BLUE CROSS/BLUE SHIELD | Attending: Emergency Medicine | Admitting: Emergency Medicine

## 2015-11-04 ENCOUNTER — Encounter (HOSPITAL_COMMUNITY): Payer: Self-pay | Admitting: Emergency Medicine

## 2015-11-04 DIAGNOSIS — Z79899 Other long term (current) drug therapy: Secondary | ICD-10-CM | POA: Diagnosis not present

## 2015-11-04 DIAGNOSIS — J029 Acute pharyngitis, unspecified: Secondary | ICD-10-CM | POA: Diagnosis present

## 2015-11-04 DIAGNOSIS — J069 Acute upper respiratory infection, unspecified: Secondary | ICD-10-CM | POA: Insufficient documentation

## 2015-11-04 DIAGNOSIS — F17210 Nicotine dependence, cigarettes, uncomplicated: Secondary | ICD-10-CM | POA: Diagnosis not present

## 2015-11-04 LAB — RAPID STREP SCREEN (MED CTR MEBANE ONLY): Streptococcus, Group A Screen (Direct): NEGATIVE

## 2015-11-04 MED ORDER — BENZONATATE 100 MG PO CAPS: 100.0000 mg | ORAL_CAPSULE | Freq: Three times a day (TID) | ORAL | 0 refills | Status: DC | PRN

## 2015-11-04 MED ORDER — DEXAMETHASONE 4 MG PO TABS
10.0000 mg | ORAL_TABLET | Freq: Once | ORAL | Status: AC
  Administered 2015-11-04: 10 mg via ORAL
  Filled 2015-11-04: qty 3

## 2015-11-04 NOTE — ED Provider Notes (Signed)
[facility] DEPT Provider Note   CSN: [id number] Arrival date & time: 11/04/15  [id number]     History   Chief Complaint Chief Complaint  Patient presents with  . Sore Throat    HPI Carol [name] is a 55 y.o. female.  HPI  Pt was seen at 1035.   Per pt, c/o gradual onset and persistence of constant sore throat, runny/stuffy nose, sinus congestion, and cough for the past 1 week.  Denies objective fevers, no rash, no CP/SOB, no N/V/D, no abd pain.    Past Medical History:  Diagnosis Date  . Chronic nausea   . Diverticula of colon   . Ovarian cyst   . Renal disorder    kidney stones    Patient Active Problem List   Diagnosis Date Noted  . GERD (gastroesophageal reflux disease) 05/09/2014  . Mucosal abnormality of stomach   . Chronic nausea 02/07/2014  . Dyspepsia 02/07/2014  . History of colonic polyps 02/07/2014  . DYSURIA 04/19/2008  . ABDOMINAL PAIN, SUPRAPUBIC 04/19/2008    Past Surgical History:  Procedure Laterality Date  . APPENDECTOMY  2010  . BREAST LUMPECTOMY     papilloma per pathology  . CHOLECYSTECTOMY  1997  . COLONOSCOPY  2010   repeat in 5 years; family hx colon ca and polypectomy  . COLONOSCOPY N/A 03/09/2014   Dr. Gala Romney: tubular adenoma, pancolonic diverticulosis. Surveillance due in 2021.   . ESOPHAGOGASTRODUODENOSCOPY N/A 03/09/2014   Dr. Gala Romney: non-critical Schatzki's ring, not manipulated, mild erosive reflux esophagitis, gastric and duodenal erosions with negative H.pylori  . KIDNEY STONE SURGERY    . SHOULDER SURGERY    . TUBAL LIGATION      OB History    Gravida Para Term Preterm AB Living             4   SAB TAB Ectopic Multiple Live Births                   Home Medications    Prior to Admission medications   Medication Sig Start Date End Date Taking? Authorizing Provider  acetaminophen (TYLENOL) 500 MG tablet Take 500 mg by mouth every 6 (six) hours as needed for mild pain.    Historical Provider, MD  acetaminophen-codeine  (TYLENOL #3) 300-30 MG per tablet Take 1 tablet by mouth every 4 (four) hours as needed for moderate pain. Patient not taking: Reported on 08/11/2015 05/30/14   Carole Civil, MD  dexamethasone (DECADRON) 4 MG tablet Take 1 tablet (4 mg total) by mouth 2 (two) times daily with a meal. Patient not taking: Reported on 08/11/2015 02/25/15   Lily Kocher, PA-C  dicyclomine (BENTYL) 20 MG tablet Take 1 tablet (20 mg total) by mouth 2 (two) times daily. 08/11/15   Fransico Meadow, PA-C  doxycycline (VIBRAMYCIN) 100 MG capsule Take 1 capsule (100 mg total) by mouth 2 (two) times daily. Patient not taking: Reported on 08/11/2015 02/25/15   Lily Kocher, PA-C  loratadine-pseudoephedrine (CLARITIN-D 12 HOUR) 5-120 MG tablet Take 1 tablet by mouth 2 (two) times daily. Patient not taking: Reported on 08/11/2015 02/25/15   Lily Kocher, PA-C  omeprazole (PRILOSEC) 20 MG capsule Take 1 capsule (20 mg total) by mouth daily. Patient taking differently: Take 20 mg by mouth daily as needed (acid reflux).  02/07/14   Carlis Stable, NP  oxyCODONE-acetaminophen (PERCOCET/ROXICET) 5-325 MG tablet Take 1 tablet by mouth every 4 (four) hours as needed for moderate pain.    Historical Provider,  MD  pantoprazole (PROTONIX) 40 MG tablet Take 1 tablet (40 mg total) by mouth daily. Patient not taking: Reported on 08/11/2015 08/09/14   Annitta Needs, NP  promethazine (PHENERGAN) 25 MG tablet Take 0.5 tablets (12.5 mg total) by mouth every 6 (six) hours as needed for nausea or vomiting. May take an additional 1/2 tablet if needed. 11/27/14   Mahala Menghini, PA-C    Family History Family History  Problem Relation Age of Onset  . Heart disease Father     Pacemaker  . CAD Mother 78    CABG  . Cancer Mother     Died age 11 of colon CA  . Atrial fibrillation Sister     Social History Social History  Substance Use Topics  . Smoking status: Current Every Day Smoker    Packs/day: 1.00    Years: 35.00    Types: Cigarettes  .  Smokeless tobacco: Never Used     Comment: Smokes about a pack a day  . Alcohol use 0.0 oz/week     Comment: socially/rarely     Allergies   Penicillins and Sulfa antibiotics   Review of Systems Review of Systems ROS: Statement: All systems negative except as marked or noted in the HPI; Constitutional: Negative for objective fever and +chills. ; ; Eyes: Negative for eye pain, redness and discharge. ; ; ENMT: Negative for ear pain, hoarseness, +nasal congestion, sinus pressure and sore throat. ; ; Cardiovascular: Negative for chest pain, palpitations, diaphoresis, dyspnea and peripheral edema. ; ; Respiratory: +cough. Negative for wheezing and stridor. ; ; Gastrointestinal: Negative for nausea, vomiting, diarrhea, abdominal pain, blood in stool, hematemesis, jaundice and rectal bleeding. . ; ; Genitourinary: Negative for dysuria, flank pain and hematuria. ; ; Musculoskeletal: Negative for back pain and neck pain. Negative for swelling and trauma.; ; Skin: Negative for pruritus, rash, abrasions, blisters, bruising and skin lesion.; ; Neuro: Negative for headache, lightheadedness and neck stiffness. Negative for weakness, altered level of consciousness, altered mental status, extremity weakness, paresthesias, involuntary movement, seizure and syncope.       Physical Exam Updated Vital Signs BP 115/71 (BP Location: Left Arm)   Pulse 62   Temp 97.9 F (36.6 C) (Oral)   Resp 18   Ht 5\' 5"  (1.651 m)   Wt 170 lb (77.1 kg)   SpO2 97%   BMI 28.29 kg/m   Physical Exam 1040: Physical examination:  Nursing notes reviewed; Vital signs and O2 SAT reviewed;  Constitutional: Well developed, Well nourished, Well hydrated, In no acute distress; Head:  Normocephalic, atraumatic; Eyes: EOMI, PERRL, No scleral icterus; ENMT: TM's clear bilat. +edemetous nasal turbinates bilat with clear rhinorrhea. +mild posterior pharyngeal erythema. Mouth and pharynx without lesions. No tonsillar exudates. No intra-oral  edema. No submandibular or sublingual edema. No hoarse voice, no drooling, no stridor. No pain with manipulation of larynx. No trismus.  Mouth and pharynx normal, Mucous membranes moist; Neck: Supple, Full range of motion, No lymphadenopathy; Cardiovascular: Regular rate and rhythm, No gallop; Respiratory: Breath sounds clear & equal bilaterally, No wheezes.  Speaking full sentences with ease, Normal respiratory effort/excursion; Chest: Nontender, Movement normal; Abdomen: Soft, Nontender, Nondistended, Normal bowel sounds; Genitourinary: No CVA tenderness; Extremities: Pulses normal, No tenderness, No edema, No calf edema or asymmetry.; Neuro: AA&Ox3, Major CN grossly intact.  Speech clear. No gross focal motor or sensory deficits in extremities.; Skin: Color normal, Warm, Dry.   ED Treatments / Results  Labs (all labs ordered are listed, but  only abnormal results are displayed)   EKG  EKG Interpretation None       Radiology   Procedures Procedures (including critical care time)  Medications Ordered in ED Medications - No data to display   Initial Impression / Assessment and Plan / ED Course  I have reviewed the triage vital signs and the nursing notes.  Pertinent labs & imaging results that were available during my care of the patient were reviewed by me and considered in my medical decision making (see chart for details).  MDM Reviewed: previous chart, nursing note and vitals Reviewed previous: labs Interpretation: labs and x-ray    Results for orders placed or performed during the hospital encounter of 11/04/15  Rapid strep screen  Result Value Ref Range   Streptococcus, Group A Screen (Direct) NEGATIVE NEGATIVE   Dg Chest 2 View Result Date: 11/04/2015 CLINICAL DATA:  Cough and fever. EXAM: CHEST  2 VIEW COMPARISON:  02/25/2015 chest radiograph FINDINGS: The cardiomediastinal silhouette is unremarkable. There is no evidence of focal airspace disease, pulmonary edema,  suspicious pulmonary nodule/mass, pleural effusion, or pneumothorax. No acute bony abnormalities are identified. IMPRESSION: No active cardiopulmonary disease. Electronically Signed   By: Margarette Canada M.D.   On: 11/04/2015 10:57    1300:  Workup reassuring. Tx symptomatically, f/u PMD. Dx and testing d/w pt and family.  Questions answered.  Verb understanding, agreeable to d/c home with outpt f/u.   Final Clinical Impressions(s) / ED Diagnoses   Final diagnoses:  None    New Prescriptions New Prescriptions   No medications on file     Francine Graven, DO 11/07/15 1705

## 2015-11-04 NOTE — ED Triage Notes (Signed)
Patient c/o sore throat, right side neck pain/soreness, cough, and intermittent fever. Per patient x2 weeks that is progressively worse. Patient states cough started as productive but is now non productive. Patient reports taking tylenol 3 last night, before going to sleep, with no relief.

## 2015-11-04 NOTE — Discharge Instructions (Signed)
Take over the counter decongestant (such as sudafed), as directed on packaging, for the next week.  Use over the counter normal saline nasal spray, as instructed in the Emergency Department, several times per day for the next 2 weeks.  Take over the counter tylenol and ibuprofen, as directed on packaging, as needed for discomfort.  Gargle with warm water several times per day to help with discomfort.  May also use over the counter sore throat pain medicines such as chloraseptic or sucrets, as directed on packaging, as needed for discomfort.  Call your regular medical doctor tomorrow to schedule a follow up appointment this week.  Return to the Emergency Department immediately if worsening.

## 2015-11-04 NOTE — ED Notes (Signed)
Pt placed on cardiac monitor 

## 2015-11-07 LAB — CULTURE, GROUP A STREP (THRC)

## 2015-11-15 ENCOUNTER — Telehealth: Payer: Self-pay | Admitting: General Practice

## 2015-11-15 NOTE — Telephone Encounter (Signed)
Patient advised that she should call the [facility] and have them send the paper work to Manufacturing engineer and they should take it over.

## 2016-04-02 ENCOUNTER — Ambulatory Visit: Payer: Self-pay | Admitting: Family Medicine

## 2016-04-02 ENCOUNTER — Encounter: Payer: Self-pay | Admitting: Family Medicine

## 2016-04-02 ENCOUNTER — Ambulatory Visit (INDEPENDENT_AMBULATORY_CARE_PROVIDER_SITE_OTHER): Payer: BLUE CROSS/BLUE SHIELD | Admitting: Family Medicine

## 2016-04-02 VITALS — BP 117/70 | HR 67 | Temp 97.6°F | Ht 65.0 in | Wt 157.0 lb

## 2016-04-02 DIAGNOSIS — R5383 Other fatigue: Secondary | ICD-10-CM

## 2016-04-02 DIAGNOSIS — J0100 Acute maxillary sinusitis, unspecified: Secondary | ICD-10-CM

## 2016-04-02 MED ORDER — PROMETHAZINE HCL 25 MG PO TABS: 25.0000 mg | ORAL_TABLET | Freq: Four times a day (QID) | ORAL | 5 refills | Status: DC | PRN

## 2016-04-02 MED ORDER — LEVOFLOXACIN 500 MG PO TABS: 500.0000 mg | ORAL_TABLET | Freq: Every day | ORAL | 0 refills | Status: DC

## 2016-04-02 NOTE — Progress Notes (Signed)
 Subjective:  Patient ID: Carol [name], female    DOB: 09/10/1960  Age: 56 y.o. MRN: [id number]  CC: Fatigue (pt here today c/o "feeling rotten" especially towards the end of the day for the past 3 weeks. Has some sinus pressure, sore throat and some coughing.)   HPI Maudine Q Whinery presents for Symptoms noted above. Onset 3 weeks ago. She is feeling stressed. By her husband's recent lung cancer surgery. However, he has been pronounced cancer free. No radiation or chemotherapy needed. She is having some upper respiratory congestion pain through the cheek bones. Some sore throat with posterior drainage and mild cough. No fever chills or sweats noted. Energy is just been quite poor. No weight gain. No hot or cold spells. No excessive constipation or hair loss. Weight is actually down 13 pounds over the last 5 months. Appetite remains good. She does have a GI doctor in [place] who treats her for chronic nausea. She is out of the Phenergan and wonders if we can refill that here today. Additionally she does have a PPI she takes daily that keeps her symptoms under control as well. Denies exacerbation recently. However she does get stomach pain with certain foods such as Mexican etc.   History Florice has a past medical history of Chronic nausea; Diverticula of colon; Ovarian cyst; and Renal disorder.   She has a past surgical history that includes Cholecystectomy (1997); Appendectomy (2010); Tubal ligation; Kidney stone surgery; Breast lumpectomy; Colonoscopy (2010); Colonoscopy (N/A, 03/09/2014); Esophagogastroduodenoscopy (N/A, 03/09/2014); and Shoulder surgery.   Her family history includes Atrial fibrillation in her sister; CAD (age of onset: 55) in her mother; Cancer in her mother; Heart disease in her father.She reports that she has been smoking Cigarettes.  She has a 35.00 pack-year smoking history. She has never used smokeless tobacco. She reports that she drinks alcohol. She reports that she does not  use drugs.    ROS Review of Systems  Constitutional: Negative for activity change, appetite change and fever.  HENT: Positive for postnasal drip, sinus pain, sinus pressure and sore throat. Negative for congestion, ear pain and rhinorrhea.   Eyes: Negative for visual disturbance.  Respiratory: Negative for cough and shortness of breath.   Cardiovascular: Negative for chest pain and palpitations.  Gastrointestinal: Positive for abdominal pain and nausea. Negative for diarrhea.  Genitourinary: Negative for dysuria.  Musculoskeletal: Negative for arthralgias and myalgias.    Objective:  BP 117/70   Pulse 67   Temp 97.6 F (36.4 C) (Oral)   Ht 5' 5" (1.651 m)   Wt 157 lb (71.2 kg)   BMI 26.13 kg/m   BP Readings from Last 3 Encounters:  04/02/16 117/70  11/04/15 115/71  08/11/15 118/68    Wt Readings from Last 3 Encounters:  04/02/16 157 lb (71.2 kg)  11/04/15 170 lb (77.1 kg)  08/11/15 170 lb (77.1 kg)     Physical Exam  Constitutional: She is oriented to person, place, and time. She appears well-developed and well-nourished. No distress.  HENT:  Head: Normocephalic and atraumatic.  Right Ear: External ear normal.  Left Ear: External ear normal.  Nose: Mucosal edema (with erythema) present. Right sinus exhibits maxillary sinus tenderness.  Mouth/Throat: Oropharynx is clear and moist.  Eyes: Conjunctivae and EOM are normal. Pupils are equal, round, and reactive to light.  Neck: Normal range of motion. Neck supple. No thyromegaly present.  Cardiovascular: Normal rate, regular rhythm and normal heart sounds.   No murmur heard. Pulmonary/Chest: Effort normal   and breath sounds normal. No respiratory distress. She has no wheezes. She has no rales.  Abdominal: Soft. Bowel sounds are normal. She exhibits no distension. There is tenderness (Mild upper abd). There is no rebound and no guarding.  Musculoskeletal: Normal range of motion.  Lymphadenopathy:    She has no cervical  adenopathy.  Neurological: She is alert and oriented to person, place, and time. She has normal reflexes.  Skin: Skin is warm and dry.  Psychiatric: She has a normal mood and affect. Her behavior is normal. Judgment and thought content normal.    Dg Chest 2 View  Result Date: 11/04/2015 CLINICAL DATA:  Cough and fever. EXAM: CHEST  2 VIEW COMPARISON:  02/25/2015 chest radiograph FINDINGS: The cardiomediastinal silhouette is unremarkable. There is no evidence of focal airspace disease, pulmonary edema, suspicious pulmonary nodule/mass, pleural effusion, or pneumothorax. No acute bony abnormalities are identified. IMPRESSION: No active cardiopulmonary disease. Electronically Signed   By: Margarette Canada M.D.   On: 11/04/2015 10:57    Assessment & Plan:   Terence was seen today for fatigue.  Diagnoses and all orders for this visit:  Acute maxillary sinusitis, recurrence not specified -     CBC with Differential/Platelet -     CMP14+EGFR -     TSH + free T4  Fatigue, unspecified type -     CBC with Differential/Platelet -     CMP14+EGFR -     TSH + free T4  Other orders -     promethazine (PHENERGAN) 25 MG tablet; Take 1 tablet (25 mg total) by mouth every 6 (six) hours as needed for nausea or vomiting. May take an additional 1/2 tablet if needed. -     levofloxacin (LEVAQUIN) 500 MG tablet; Take 1 tablet (500 mg total) by mouth daily. For 10 days     I have discontinued Ms. Cisnero's acetaminophen-codeine, pantoprazole, doxycycline, dexamethasone, loratadine-pseudoephedrine, oxyCODONE-acetaminophen, dicyclomine, and benzonatate. I have also changed her promethazine. Additionally, I am having her start on levofloxacin. Lastly, I am having her maintain her acetaminophen and omeprazole.  Allergies as of 04/02/2016      Reactions   Penicillins Shortness Of Breath, Swelling   Has patient had a PCN reaction causing immediate rash, facial/tongue/throat swelling, SOB or lightheadedness with  hypotension: Yes Has patient had a PCN reaction causing severe rash involving mucus membranes or skin necrosis: No Has patient had a PCN reaction that required hospitalization No Has patient had a PCN reaction occurring within the last 10 years: No If all of the above answers are "NO", then may proceed with Cephalosporin use.   Sulfa Antibiotics    Nausea, itching, headache      Medication List       Accurate as of 04/02/16 10:56 AM. Always use your most recent med list.          acetaminophen 500 MG tablet Commonly known as:  TYLENOL Take 500 mg by mouth every 6 (six) hours as needed for mild pain.   levofloxacin 500 MG tablet Commonly known as:  LEVAQUIN Take 1 tablet (500 mg total) by mouth daily. For 10 days   omeprazole 20 MG tablet Commonly known as:  PRILOSEC OTC Take 20 mg by mouth daily.   promethazine 25 MG tablet Commonly known as:  PHENERGAN Take 1 tablet (25 mg total) by mouth every 6 (six) hours as needed for nausea or vomiting. May take an additional 1/2 tablet if needed.        Follow-up: Return  in about 1 month (around 04/30/2016) for fatigue.  Claretta Fraise, M.D.

## 2016-04-03 LAB — CMP14+EGFR
ALT: 15 IU/L (ref 0–32)
AST: 15 IU/L (ref 0–40)
Albumin/Globulin Ratio: 2.3 — ABNORMAL HIGH (ref 1.2–2.2)
Albumin: 4.3 g/dL (ref 3.5–5.5)
Alkaline Phosphatase: 75 IU/L (ref 39–117)
BUN/Creatinine Ratio: 20 (ref 9–23)
BUN: 14 mg/dL (ref 6–24)
Bilirubin Total: 0.4 mg/dL (ref 0.0–1.2)
CO2: 27 mmol/L (ref 18–29)
Calcium: 9.5 mg/dL (ref 8.7–10.2)
Chloride: 101 mmol/L (ref 96–106)
Creatinine, Ser: 0.71 mg/dL (ref 0.57–1.00)
GFR calc Af Amer: 112 mL/min/{1.73_m2} (ref >59)
GFR calc non Af Amer: 98 mL/min/{1.73_m2} (ref >59)
Globulin, Total: 1.9 g/dL (ref 1.5–4.5)
Glucose: 94 mg/dL (ref 65–99)
Potassium: 4.3 mmol/L (ref 3.5–5.2)
Sodium: 142 mmol/L (ref 134–144)
Total Protein: 6.2 g/dL (ref 6.0–8.5)

## 2016-04-03 LAB — CBC WITH DIFFERENTIAL/PLATELET
Basophils Absolute: 0 10*3/uL (ref 0.0–0.2)
Basos: 0 %
EOS (ABSOLUTE): 0.1 10*3/uL (ref 0.0–0.4)
Eos: 2 %
Hematocrit: 42.6 % (ref 34.0–46.6)
Hemoglobin: 13.8 g/dL (ref 11.1–15.9)
Immature Grans (Abs): 0 10*3/uL (ref 0.0–0.1)
Immature Granulocytes: 0 %
Lymphocytes Absolute: 2.5 10*3/uL (ref 0.7–3.1)
Lymphs: 36 %
MCH: 29.6 pg (ref 26.6–33.0)
MCHC: 32.4 g/dL (ref 31.5–35.7)
MCV: 91 fL (ref 79–97)
Monocytes Absolute: 0.6 10*3/uL (ref 0.1–0.9)
Monocytes: 9 %
Neutrophils Absolute: 3.6 10*3/uL (ref 1.4–7.0)
Neutrophils: 53 %
Platelets: 277 10*3/uL (ref 150–379)
RBC: 4.66 x10E6/uL (ref 3.77–5.28)
RDW: 12.3 % (ref 12.3–15.4)
WBC: 6.8 10*3/uL (ref 3.4–10.8)

## 2016-04-03 LAB — TSH+FREE T4
Free T4: 1.54 ng/dL (ref 0.82–1.77)
TSH: 1.89 u[IU]/mL (ref 0.450–4.500)

## 2016-09-15 ENCOUNTER — Emergency Department (HOSPITAL_COMMUNITY): Payer: BLUE CROSS/BLUE SHIELD

## 2016-09-15 ENCOUNTER — Emergency Department (HOSPITAL_COMMUNITY)
Admission: EM | Admit: 2016-09-15 | Discharge: 2016-09-15 | Disposition: A | Discharge status: Home / Self Care | Payer: BLUE CROSS/BLUE SHIELD | Attending: Emergency Medicine | Admitting: Emergency Medicine

## 2016-09-15 ENCOUNTER — Encounter (HOSPITAL_COMMUNITY): Payer: Self-pay | Admitting: Emergency Medicine

## 2016-09-15 DIAGNOSIS — R197 Diarrhea, unspecified: Secondary | ICD-10-CM | POA: Insufficient documentation

## 2016-09-15 DIAGNOSIS — B9689 Other specified bacterial agents as the cause of diseases classified elsewhere: Secondary | ICD-10-CM | POA: Insufficient documentation

## 2016-09-15 DIAGNOSIS — R1030 Lower abdominal pain, unspecified: Secondary | ICD-10-CM | POA: Insufficient documentation

## 2016-09-15 DIAGNOSIS — R110 Nausea: Secondary | ICD-10-CM | POA: Diagnosis not present

## 2016-09-15 DIAGNOSIS — N760 Acute vaginitis: Secondary | ICD-10-CM | POA: Diagnosis not present

## 2016-09-15 DIAGNOSIS — R5383 Other fatigue: Secondary | ICD-10-CM | POA: Insufficient documentation

## 2016-09-15 DIAGNOSIS — F17210 Nicotine dependence, cigarettes, uncomplicated: Secondary | ICD-10-CM | POA: Diagnosis not present

## 2016-09-15 DIAGNOSIS — Z79899 Other long term (current) drug therapy: Secondary | ICD-10-CM | POA: Insufficient documentation

## 2016-09-15 HISTORY — DX: Gastro-esophageal reflux disease without esophagitis: K21.9

## 2016-09-15 LAB — COMPREHENSIVE METABOLIC PANEL
ALT: 34 U/L (ref 14–54)
AST: 38 U/L (ref 15–41)
Albumin: 3.6 g/dL (ref 3.5–5.0)
Alkaline Phosphatase: 76 U/L (ref 38–126)
Anion gap: 7 (ref 5–15)
BUN: 19 mg/dL (ref 6–20)
CO2: 27 mmol/L (ref 22–32)
Calcium: 8.8 mg/dL — ABNORMAL LOW (ref 8.9–10.3)
Chloride: 107 mmol/L (ref 101–111)
Creatinine, Ser: 0.75 mg/dL (ref 0.44–1.00)
GFR calc Af Amer: >60 mL/min (ref >60)
GFR calc non Af Amer: >60 mL/min (ref >60)
Glucose, Bld: 102 mg/dL — ABNORMAL HIGH (ref 65–99)
Potassium: 4.4 mmol/L (ref 3.5–5.1)
Sodium: 141 mmol/L (ref 135–145)
Total Bilirubin: 0.4 mg/dL (ref 0.3–1.2)
Total Protein: 6.5 g/dL (ref 6.5–8.1)

## 2016-09-15 LAB — WET PREP, GENITAL
Sperm: NONE SEEN
Trich, Wet Prep: NONE SEEN
Yeast Wet Prep HPF POC: NONE SEEN

## 2016-09-15 LAB — URINALYSIS, ROUTINE W REFLEX MICROSCOPIC
Bilirubin Urine: NEGATIVE
Glucose, UA: NEGATIVE mg/dL
Hgb urine dipstick: NEGATIVE
Ketones, ur: NEGATIVE mg/dL
Leukocytes, UA: NEGATIVE
Nitrite: NEGATIVE
Protein, ur: NEGATIVE mg/dL
Specific Gravity, Urine: 1.004 — ABNORMAL LOW (ref 1.005–1.030)
pH: 6 (ref 5.0–8.0)

## 2016-09-15 LAB — CBC WITH DIFFERENTIAL/PLATELET
Basophils Absolute: 0 10*3/uL (ref 0.0–0.1)
Basophils Relative: 0 %
Eosinophils Absolute: 0.1 10*3/uL (ref 0.0–0.7)
Eosinophils Relative: 2 %
HCT: 40.9 % (ref 36.0–46.0)
Hemoglobin: 13.6 g/dL (ref 12.0–15.0)
Lymphocytes Relative: 28 %
Lymphs Abs: 2 10*3/uL (ref 0.7–4.0)
MCH: 30.6 pg (ref 26.0–34.0)
MCHC: 33.3 g/dL (ref 30.0–36.0)
MCV: 92.1 fL (ref 78.0–100.0)
Monocytes Absolute: 0.7 10*3/uL (ref 0.1–1.0)
Monocytes Relative: 10 %
Neutro Abs: 4.4 10*3/uL (ref 1.7–7.7)
Neutrophils Relative %: 60 %
Platelets: 223 10*3/uL (ref 150–400)
RBC: 4.44 MIL/uL (ref 3.87–5.11)
RDW: 11.4 % — ABNORMAL LOW (ref 11.5–15.5)
WBC: 7.3 10*3/uL (ref 4.0–10.5)

## 2016-09-15 LAB — LIPASE, BLOOD: Lipase: 43 U/L (ref 11–51)

## 2016-09-15 MED ORDER — MORPHINE SULFATE (PF) 4 MG/ML IV SOLN
4.0000 mg | Freq: Once | INTRAVENOUS | Status: AC
  Administered 2016-09-15: 4 mg via INTRAVENOUS
  Filled 2016-09-15: qty 1

## 2016-09-15 MED ORDER — METRONIDAZOLE 500 MG PO TABS: 500.0000 mg | ORAL_TABLET | Freq: Two times a day (BID) | ORAL | 0 refills | Status: DC

## 2016-09-15 MED ORDER — IOPAMIDOL (ISOVUE-300) INJECTION 61%
100.0000 mL | Freq: Once | INTRAVENOUS | Status: AC | PRN
  Administered 2016-09-15: 100 mL via INTRAVENOUS

## 2016-09-15 MED ORDER — ONDANSETRON HCL 4 MG/2ML IJ SOLN
4.0000 mg | Freq: Once | INTRAMUSCULAR | Status: AC
  Administered 2016-09-15: 4 mg via INTRAVENOUS
  Filled 2016-09-15: qty 2

## 2016-09-15 NOTE — Discharge Instructions (Signed)
Please read attached information regarding your condition. Take Flagyl twice daily for 1 week. Follow-up with GI specialist for further evaluation if symptoms persist. Return to ED for worsening pain, vomiting, lightheadedness, blood in stool, pelvic pain or abnormal bleeding.

## 2016-09-15 NOTE — ED Notes (Signed)
[facility] called and reported received sample for GC/Chlamydia but an order was not placed. [facility] reported to consult physician for order and call back when order was placed. Contact information [phone].   Consulted Dr. Alvino Chapel, verbal order given. Order placed in EPIC. [facility] notified.

## 2016-09-15 NOTE — ED Provider Notes (Signed)
[facility] DEPT Provider Note   CSN: [id number] Arrival date & time: 09/15/16  0812     History   Chief Complaint Chief Complaint  Patient presents with  . Abdominal Pain    HPI Carol [name] is a 56 y.o. female.  HPI  Patient presents to ED for evaluation of abdominal pain that has gradually gotten worse for the past 3-4 weeks. She states that the pain is constant but has times when it increases and feels "sharp and stabbing." She also reports dysuria and foul-smelling urine, as well as nausea and diarrhea. She denies any vomiting, blood in stool, constipation, fevers, appetite changes, weight changes, chest pain, shortness of breath, sick contacts. States that she has a family history of colon and liver cancer.  Past Medical History:  Diagnosis Date  . Chronic nausea   . Diverticula of colon   . GERD (gastroesophageal reflux disease)   . Ovarian cyst   . Renal disorder    kidney stones    Patient Active Problem List   Diagnosis Date Noted  . GERD (gastroesophageal reflux disease) 05/09/2014  . Mucosal abnormality of stomach   . Chronic nausea 02/07/2014  . Dyspepsia 02/07/2014  . History of colonic polyps 02/07/2014  . DYSURIA 04/19/2008  . ABDOMINAL PAIN, SUPRAPUBIC 04/19/2008    Past Surgical History:  Procedure Laterality Date  . APPENDECTOMY  2010  . BREAST LUMPECTOMY     papilloma per pathology  . CHOLECYSTECTOMY  1997  . COLONOSCOPY  2010   repeat in 5 years; family hx colon ca and polypectomy  . COLONOSCOPY N/A 03/09/2014   Dr. Gala Romney: tubular adenoma, pancolonic diverticulosis. Surveillance due in 2021.   . ESOPHAGOGASTRODUODENOSCOPY N/A 03/09/2014   Dr. Gala Romney: non-critical Schatzki's ring, not manipulated, mild erosive reflux esophagitis, gastric and duodenal erosions with negative H.pylori  . KIDNEY STONE SURGERY    . SHOULDER SURGERY    . TUBAL LIGATION      OB History    Gravida Para Term Preterm AB Living             4   SAB TAB Ectopic  Multiple Live Births                   Home Medications    Prior to Admission medications   Medication Sig Start Date End Date Taking? Authorizing Provider  acetaminophen (TYLENOL) 500 MG tablet Take 500 mg by mouth every 6 (six) hours as needed for mild pain.   Yes [provider]  omeprazole (PRILOSEC OTC) 20 MG tablet Take 20 mg by mouth daily.   Yes [provider]  promethazine (PHENERGAN) 25 MG tablet Take 1 tablet (25 mg total) by mouth every 6 (six) hours as needed for nausea or vomiting. May take an additional 1/2 tablet if needed. 04/02/16  Yes Stacks, Cletus Gash, MD  levofloxacin (LEVAQUIN) 500 MG tablet Take 1 tablet (500 mg total) by mouth daily. For 10 days Patient not taking: Reported on 09/15/2016 04/02/16   Claretta Fraise, MD  metroNIDAZOLE (FLAGYL) 500 MG tablet Take 1 tablet (500 mg total) by mouth 2 (two) times daily. 09/15/16   Delia Heady, PA-C    Family History Family History  Problem Relation Age of Onset  . Heart disease Father        Pacemaker  . CAD Mother 40       CABG  . Cancer Mother        Died age 46 of colon CA  .  Atrial fibrillation Sister     Social History Social History  Substance Use Topics  . Smoking status: Current Every Day Smoker    Packs/day: 1.00    Years: 35.00    Types: Cigarettes  . Smokeless tobacco: Never Used     Comment: Smokes about a pack a day  . Alcohol use 0.0 oz/week     Comment: socially/rarely     Allergies   Penicillins and Sulfa antibiotics   Review of Systems Review of Systems  Constitutional: Positive for fatigue. Negative for appetite change, chills and fever.  HENT: Negative for ear pain, rhinorrhea, sneezing and sore throat.   Eyes: Negative for photophobia and visual disturbance.  Respiratory: Negative for cough, chest tightness, shortness of breath and wheezing.   Cardiovascular: Negative for chest pain and palpitations.  Gastrointestinal: Positive for abdominal pain, diarrhea and  nausea. Negative for blood in stool, constipation and vomiting.  Genitourinary: Positive for dysuria. Negative for hematuria, urgency, vaginal bleeding and vaginal discharge.  Musculoskeletal: Negative for myalgias.  Skin: Negative for rash.  Neurological: Negative for dizziness, weakness and light-headedness.     Physical Exam Updated Vital Signs BP 103/65   Pulse (!) 53   Temp 98.1 F (36.7 C) (Oral)   Resp 20   Ht 5\' 5"  (1.651 m)   Wt 72.6 kg (160 lb)   SpO2 99%   BMI 26.63 kg/m   Physical Exam  Constitutional: She appears well-developed and well-nourished. No distress.  HENT:  Head: Normocephalic and atraumatic.  Nose: Nose normal.  Eyes: Conjunctivae and EOM are normal. Left eye exhibits no discharge. No scleral icterus.  Neck: Normal range of motion. Neck supple.  Cardiovascular: Normal rate, regular rhythm, normal heart sounds and intact distal pulses.  Exam reveals no gallop and no friction rub.   No murmur heard. Pulmonary/Chest: Effort normal and breath sounds normal. No respiratory distress.  Abdominal: Soft. Bowel sounds are normal. She exhibits no distension. There is tenderness (Left lower quadrant). There is no rebound and no guarding.  No CVA tenderness. No right upper quadrant tenderness.  Genitourinary: There is no rash on the right labia. There is no rash on the left labia. Cervix exhibits no motion tenderness, no discharge and no friability. Right adnexum displays tenderness. Left adnexum displays no tenderness. Vaginal discharge found.  Genitourinary Comments: Chaperone present during entirety of exam. There is white vaginal discharge noted in the vaginal vault. Right adnexal tenderness noted. No cervical motion tenderness or cervical friability noticed.  Musculoskeletal: Normal range of motion. She exhibits no edema.  Neurological: She is alert. She exhibits normal muscle tone. Coordination normal.  Skin: Skin is warm and dry. No rash noted. She is not  diaphoretic.  Psychiatric: She has a normal mood and affect.  Nursing note and vitals reviewed.    ED Treatments / Results  Labs (all labs ordered are listed, but only abnormal results are displayed) Labs Reviewed  WET PREP, GENITAL - Abnormal; Notable for the following:       Result Value   Clue Cells Wet Prep HPF POC PRESENT (*)    WBC, Wet Prep HPF POC MANY (*)    All other components within normal limits  URINALYSIS, ROUTINE W REFLEX MICROSCOPIC - Abnormal; Notable for the following:    Specific Gravity, Urine 1.004 (*)    All other components within normal limits  COMPREHENSIVE METABOLIC PANEL - Abnormal; Notable for the following:    Glucose, Bld 102 (*)    Calcium 8.8 (*)  All other components within normal limits  CBC WITH DIFFERENTIAL/PLATELET - Abnormal; Notable for the following:    RDW 11.4 (*)    All other components within normal limits  LIPASE, BLOOD    EKG  EKG Interpretation None       Radiology Dg Abdomen 1 View  Result Date: 09/15/2016 CLINICAL DATA:  Abdominal pain.  Nausea and diarrhea . EXAM: ABDOMEN - 1 VIEW COMPARISON:  CT 08/11/2015 . FINDINGS: Surgical clips right upper quadrant. Soft tissue structures are unremarkable. Several nondistended nonspecific air-filled loops of small bowel noted. No bowel distention or free air. Calcific density noted projected over the right lower pelvis consistent phlebolith. No acute bony abnormality . IMPRESSION: No acute abnormality. Electronically Signed   By: Marcello Moores  Register   On: 09/15/2016 09:23   Ct Abdomen Pelvis W Contrast  Result Date: 09/15/2016 CLINICAL DATA:  Left lower quadrant abdominal pain for 3 weeks. EXAM: CT ABDOMEN AND PELVIS WITH CONTRAST TECHNIQUE: Multidetector CT imaging of the abdomen and pelvis was performed using the standard protocol following bolus administration of intravenous contrast. CONTRAST:  138mL ISOVUE-300 IOPAMIDOL (ISOVUE-300) INJECTION 61% COMPARISON:  CT abdomen pelvis  dated August 11, 2015 and May 27, 2011. FINDINGS: Lower chest: No acute abnormality. Hepatobiliary: Vague geographic hypervascular lesion in the left hepatic lobe (series 2, image 14) only seen on the late arterial phase is slightly more prominent compared to 2013. Status post cholecystectomy. Mild intra and extrahepatic biliary dilatation is unchanged and likely due to post cholecystectomy state. Pancreas: Unremarkable. No pancreatic ductal dilatation or surrounding inflammatory changes. Spleen: Normal in size without focal abnormality. Adrenals/Urinary Tract: Adrenal glands are unremarkable. Unchanged punctate right renal calculus. Kidneys are otherwise normal, without focal lesion, or hydronephrosis. Bladder is unremarkable. Stomach/Bowel: Stomach is within normal limits. Appendix is surgically absent. No evidence of bowel wall thickening, distention, or inflammatory changes. Vascular/Lymphatic: Aortic atherosclerosis. No enlarged abdominal or pelvic lymph nodes. Reproductive: Uterus and bilateral adnexa are unremarkable. Other: Small fat containing umbilical hernia. No abdominopelvic ascites. Musculoskeletal: No acute or significant osseous findings. IMPRESSION: 1. No acute intra-abdominal process. 2. The geographic hypervascular lesion in the left hepatic lobe, only seen on the late arterial phase, is slightly more prominent when compared to prior studies. This may represent a perfusion anomaly, however, MRI with and without contrast can be obtained for further evaluation. 3.  Aortic Atherosclerosis (ICD10-I70.0). Electronically Signed   By: Titus Dubin M.D.   On: 09/15/2016 12:04    Procedures Procedures (including critical care time)  Medications Ordered in ED Medications  ondansetron (ZOFRAN) injection 4 mg (4 mg Intravenous Given 09/15/16 0931)  morphine 4 MG/ML injection 4 mg (4 mg Intravenous Given 09/15/16 0931)  iopamidol (ISOVUE-300) 61 % injection 100 mL (100 mLs Intravenous Contrast Given  09/15/16 1130)  morphine 4 MG/ML injection 4 mg (4 mg Intravenous Given 09/15/16 1206)     Initial Impression / Assessment and Plan / ED Course  I have reviewed the triage vital signs and the nursing notes.  Pertinent labs & imaging results that were available during my care of the patient were reviewed by me and considered in my medical decision making (see chart for details).     Patient presents to the ED for evaluation of lower abdominal pain that has gradually worsened for the past 3-4 weeks. She also reports dysuria and foul-smelling urine, nausea and diarrhea. On physical exam there is left lower quadrant tenderness present. There is no rebound or guarding present. No CVA tenderness  noted. CBC, CMP, lipase, urinalysis unremarkable. No leukocytosis. Abdominal x-ray showed no evidence of obstruction or other acute process. On recheck, patient states that she has been having some vaginal discharge that she would like to get tested. She also states that she has had a history of appendicitis, diverticulitis, ovarian cyst which required hospitalization in the past.  Will complete pelvic exam and CT of the abdomen for further evaluation.  Wet prep revealed clue cells. CT of the abdomen and pelvis Returned as negative. We'll give Flagyl for BV and advise patient to follow up with GI specialist which she has seen in the past. Pain controlled here with morphine. Patient appears stable for discharge at this time. Strict return precautions given.  Final Clinical Impressions(s) / ED Diagnoses   Final diagnoses:  Lower abdominal pain  Bacterial vaginosis    New Prescriptions New Prescriptions   METRONIDAZOLE (FLAGYL) 500 MG TABLET    Take 1 tablet (500 mg total) by mouth 2 (two) times daily.     Delia Heady, PA-C 09/15/16 1219    Davonna Belling, MD 09/15/16 [phone]

## 2016-09-15 NOTE — ED Notes (Signed)
Patient transported to CT 

## 2016-09-15 NOTE — ED Triage Notes (Signed)
Patient complaining of lower left abdominal pain x 3 weeks. Also complaining of burning with urination and "a bad smelling urine."

## 2016-09-16 LAB — GC/CHLAMYDIA PROBE AMP (~~LOC~~) NOT AT ARMC
Chlamydia: NEGATIVE
Neisseria Gonorrhea: NEGATIVE

## 2016-11-12 ENCOUNTER — Encounter: Payer: Self-pay | Admitting: Family Medicine

## 2016-11-12 ENCOUNTER — Ambulatory Visit (INDEPENDENT_AMBULATORY_CARE_PROVIDER_SITE_OTHER): Payer: BLUE CROSS/BLUE SHIELD | Admitting: Family Medicine

## 2016-11-12 VITALS — BP 118/81 | HR 60 | Temp 98.3°F | Ht 65.0 in | Wt 157.0 lb

## 2016-11-12 DIAGNOSIS — J069 Acute upper respiratory infection, unspecified: Secondary | ICD-10-CM | POA: Diagnosis not present

## 2016-11-12 DIAGNOSIS — B9789 Other viral agents as the cause of diseases classified elsewhere: Secondary | ICD-10-CM

## 2016-11-12 MED ORDER — BENZONATATE 200 MG PO CAPS: 200.0000 mg | ORAL_CAPSULE | Freq: Two times a day (BID) | ORAL | 0 refills | Status: DC | PRN

## 2016-11-12 NOTE — Progress Notes (Signed)
Subjective: CC:uri symptoms PCP: Carol Fraise, MD WNI:OEVO Q Hebel is a 56 y.o. female presenting to clinic today for:  1. Cold symptoms  Patient reports nonproductive cough, rhinorrhea, sneezing, head pressure that started 1 week ago.  Reports sick husband and coworkers.  Endorses some loose nonbloody stools.  Denies SOB, dizziness, rash, nausea, vomiting, fevers, chills, myalgia, recent travel.  Patient has used Alkaseltzer cold and tylenol with little relief of symptoms.  No history of COPD or asthma.   Allergies  Allergen Reactions  . Penicillins Shortness Of Breath and Swelling    Has patient had a PCN reaction causing immediate rash, facial/tongue/throat swelling, SOB or lightheadedness with hypotension: Yes Has patient had a PCN reaction causing severe rash involving mucus membranes or skin necrosis: No Has patient had a PCN reaction that required hospitalization No Has patient had a PCN reaction occurring within the last 10 years: No If all of the above answers are "NO", then may proceed with Cephalosporin use.   . Sulfa Antibiotics     Nausea, itching, headache   Past Medical History:  Diagnosis Date  . Chronic nausea   . Diverticula of colon   . GERD (gastroesophageal reflux disease)   . Ovarian cyst   . Renal disorder    kidney stones   Family History  Problem Relation Age of Onset  . Heart disease Father        Pacemaker  . CAD Mother 13       CABG  . Cancer Mother        Died age 41 of colon CA  . Atrial fibrillation Sister    Social Hx: every day smoker.Current medications reviewed.   ROS: Per HPI  Objective: Office vital signs reviewed. BP 118/81   Pulse 60   Temp 98.3 F (36.8 C) (Oral)   Ht 5\' 5"  (1.651 m)   Wt 157 lb (71.2 kg)   BMI 26.13 kg/m   Physical Examination:  General: Awake, alert, well nourished, nontoxic appearing, No acute distress HEENT: Normal    Neck: No masses palpated. No lymphadenopathy    Ears: Tympanic membranes  intact; mild obstruction by cerumen bilaterally, normal light reflex, no erythema, no bulging    Eyes: PERRLA, extraocular membranes intact, sclera white    Nose: nasal turbinates moist, clear nasal discharge; turbinates edematous    Throat: moist mucus membranes, no erythema, no tonsillar exudate.  Airway is patent Cardio: regular rate and rhythm, S1S2 heard, no murmurs appreciated Pulm: clear to auscultation bilaterally, no wheezes, rhonchi or rales; normal work of breathing on room air Neuro: no focal deficits  Assessment/ Plan: 56 y.o. female   1. Viral URI with cough No evidence of bacterial infection on exam. Patient appears well hydrated on exam. Her vital signs are stable and she is afebrile. I did discuss supportive measures at home. I encouraged oral hydration, rest. Patient may take Afrin nasal spray as needed at bedtime. I did advise that she not use this longer than 3 days. I encouraged her to consider naproxen twice daily as needed for headache. Tessalon Perles were prescribed for cough. Return precautions were reviewed with the patient. She voiced good understanding. She'll follow up as needed. - benzonatate (TESSALON) 200 MG capsule; Take 1 capsule (200 mg total) by mouth 2 (two) times daily as needed for cough.  Dispense: 20 capsule; Refill: 0   No orders of the defined types were placed in this encounter.  Meds ordered this encounter  Medications  .  benzonatate (TESSALON) 200 MG capsule    Sig: Take 1 capsule (200 mg total) by mouth 2 (two) times daily as needed for cough.    Dispense:  20 capsule    Refill:  [place], DO [place] [phone]

## 2016-11-12 NOTE — Patient Instructions (Signed)
It appears that you have a viral upper respiratory infection (cold).  Cold symptoms can last up to 2 weeks.  As we discussed, you can use Aleve twice a day to help reduce viral symptoms. Make sure he take this with a meal. Continue taking your omeprazole while on Aleve. I sent in a cough medicine called Tessalon. This is nondrowsy. You may work and operate heavy machinery if needed while taking this medicine. Additionally, we discussed that you may use Afrin nasal spray at bedtime as needed for nasal congestion. I would avoid using this longer than 3 days. Continue hydrating while. Follow-up as needed.  - Get plenty of rest and drink plenty of fluids. - Try to breathe moist air. Use a humidifier or take a steamy shower. - Consume warm fluids (soup or tea) to provide relief for a stuffy nose and to loosen phlegm. - For nasal stuffiness, try saline nasal spray or a Neti Pot. - For sore throat pain relief: suck on throat lozenges, hard candy or popsicles; gargle with warm salt water (1/4 tsp. salt per 8 oz. of water); and eat soft, bland foods. - Eat a well-balanced diet. If you cannot, ensure you are getting enough nutrients by taking a daily multivitamin. - Avoid dairy products, as they can thicken phlegm. - Avoid alcohol, as it impairs your body's immune system.  CONTACT YOUR DOCTOR IF YOU EXPERIENCE ANY OF THE FOLLOWING: - High fever - Ear pain - Sinus-type headache - Unusually severe cold symptoms - Cough that gets worse while other cold symptoms improve - Flare up of any chronic lung problem, such as asthma - Your symptoms persist longer than 2 weeks

## 2017-02-27 ENCOUNTER — Encounter: Payer: Self-pay | Admitting: Pediatrics

## 2017-02-27 ENCOUNTER — Ambulatory Visit (INDEPENDENT_AMBULATORY_CARE_PROVIDER_SITE_OTHER): Payer: BLUE CROSS/BLUE SHIELD | Admitting: Pediatrics

## 2017-02-27 VITALS — BP 124/81 | HR 60 | Temp 98.1°F | Resp 18 | Ht 65.0 in | Wt 156.4 lb

## 2017-02-27 DIAGNOSIS — J029 Acute pharyngitis, unspecified: Secondary | ICD-10-CM | POA: Diagnosis not present

## 2017-02-27 DIAGNOSIS — J069 Acute upper respiratory infection, unspecified: Secondary | ICD-10-CM

## 2017-02-27 LAB — RAPID STREP SCREEN (MED CTR MEBANE ONLY): Strep Gp A Ag, IA W/Reflex: POSITIVE — AB

## 2017-02-27 MED ORDER — AZITHROMYCIN 250 MG PO TABS: ORAL_TABLET | ORAL | 0 refills | Status: DC

## 2017-02-27 NOTE — Progress Notes (Signed)
  Subjective:   Patient ID: Carol [name], female    DOB: 09/01/1960, 57 y.o.   MRN: [id number] CC: Cough; Nasal Congestion; and Sore Throat  HPI: Carol [name] is a 57 y.o. female presenting for Cough; Nasal Congestion; and Sore Throat  Cough and nasal congestion coming and going for past 2 months Cough is sometimes productive, not always Will have symptoms for about a week, then get better, then come back Sore throat started two days ago, says R side of her neck hurts when she swallows  No fevers Appetite has been ok Works at [employer], many of co workers have been sick recently Smoking daily, not ready to quit  Has never had wheezing before, not on inhalers at home  No ear pain  Relevant past medical, surgical, family and social history reviewed. Allergies and medications reviewed and updated. Social History   Tobacco Use  Smoking Status Current Every Day Smoker  . Packs/day: 1.00  . Years: 35.00  . Pack years: 35.00  . Types: Cigarettes  Smokeless Tobacco Never Used  Tobacco Comment   Smokes about a pack a day   ROS: Per HPI   Objective:    BP 124/81   Pulse 60   Temp 98.1 F (36.7 C) (Oral)   Resp 18   Ht 5\' 5"  (1.651 m)   Wt 156 lb 6.4 oz (70.9 kg)   SpO2 100%   BMI 26.03 kg/m   Wt Readings from Last 3 Encounters:  02/27/17 156 lb 6.4 oz (70.9 kg)  11/12/16 157 lb (71.2 kg)  09/15/16 160 lb (72.6 kg)    Gen: NAD, alert, cooperative with exam, NCAT, some congestion EYES: EOMI, no conjunctival injection, or no icterus ENT:  TMs partially obscured by cerumen bilaterally, normal gray in visible portions, OP with rythema LYMPH: no cervical LAD CV: NRRR, normal S1/S2, no murmur, distal pulses 2+ b/l Resp: CTABL, no wheezes, normal WOB Ext: No edema, warm Neuro: Alert and oriented  Assessment & Plan:  Carol [name] was seen today for cough, nasal congestion and sore throat.  Diagnoses and all orders for this visit:  Acute URI Symptomatic care discussed Suspect  that she has gotten several upper respiratory infections over the last few months that are contributing to her symptoms No wheezing today Return precautions discussed  Sore throat Will test for strep throat given new sore throat  Follow up plan: Return if symptoms worsen or fail to improve. Assunta Found, MD [place]

## 2017-02-27 NOTE — Patient Instructions (Addendum)
Try cetrizine/Zyrtec Take Tylenol as needed for aches and chills  Any worsening in symptoms or trouble breathing or feeling short of breath I would like you to be seen

## 2017-02-27 NOTE — Addendum Note (Signed)
Addended by: Wardell Heath on: 02/27/2017 09:33 AM   Modules accepted: Orders

## 2017-02-27 NOTE — Addendum Note (Signed)
Addended by: Wardell Heath on: 02/27/2017 10:04 AM   Modules accepted: Orders

## 2017-03-31 ENCOUNTER — Telehealth: Payer: Self-pay | Admitting: Family Medicine

## 2017-03-31 NOTE — Telephone Encounter (Signed)
What is the name of the medication? promethazine (PHENERGAN) 25 MG tablet  Have you contacted your pharmacy to request a refill? no  Which pharmacy would you like this sent to? [facility]   Patient notified that their request is being sent to the clinical staff for review and that they should receive a call once it is complete. If they do not receive a call within 24 hours they can check with their pharmacy or our office.

## 2017-03-31 NOTE — Telephone Encounter (Signed)
Pt. Needs to be seen for this. Thanks, WS 

## 2017-03-31 NOTE — Telephone Encounter (Signed)
Please advise if refill will be given on phenergan.

## 2017-04-01 NOTE — Telephone Encounter (Signed)
Left detailed message on patients voicemail that she ntbs and advised to contact office so appt can be made

## 2017-04-10 ENCOUNTER — Ambulatory Visit (INDEPENDENT_AMBULATORY_CARE_PROVIDER_SITE_OTHER): Payer: BLUE CROSS/BLUE SHIELD | Admitting: Family Medicine

## 2017-04-10 VITALS — BP 120/82 | HR 65 | Temp 97.9°F | Ht 65.0 in | Wt 156.0 lb

## 2017-04-10 DIAGNOSIS — R110 Nausea: Secondary | ICD-10-CM | POA: Diagnosis not present

## 2017-04-10 MED ORDER — PROMETHAZINE HCL 25 MG PO TABS: 25.0000 mg | ORAL_TABLET | Freq: Four times a day (QID) | ORAL | 0 refills | Status: DC | PRN

## 2017-04-10 NOTE — Progress Notes (Signed)
Subjective: CC: med refills PCP: Claretta Fraise, MD [id number] Q Quebedeaux is a 57 y.o. female presenting to clinic today for:  1.  Chronic nausea Patient notes that she has had a several year history of chronic nausea, which seems to be worse with eating.  She is actually had her gallbladder removed.  Denies abdominal pain.  Denies vomiting.  She has had a few episodes of loose stools that are nonbloody over the last couple of days.  No fevers or sick contact.  No known consumption of undercooked foods, foods that have been left out too long, untreated water.  She has seen gastroenterology in the past and had quite an extensive workup, including gastric emptying study.  She notes that she has not followed up since 2016.  She is not a diabetic.  She is compliant with omeprazole 20 mg daily.  She is never tried increasing this to 40 mg daily.  She reports that she uses promethazine roughly every other day for nausea symptoms.  Denies dystonic symptoms.   ROS: Per HPI  Allergies  Allergen Reactions  . Penicillins Shortness Of Breath and Swelling    Has patient had a PCN reaction causing immediate rash, facial/tongue/throat swelling, SOB or lightheadedness with hypotension: Yes Has patient had a PCN reaction causing severe rash involving mucus membranes or skin necrosis: No Has patient had a PCN reaction that required hospitalization No Has patient had a PCN reaction occurring within the last 10 years: No If all of the above answers are "NO", then may proceed with Cephalosporin use.   . Sulfa Antibiotics     Nausea, itching, headache   Past Medical History:  Diagnosis Date  . Chronic nausea   . Diverticula of colon   . GERD (gastroesophageal reflux disease)   . Ovarian cyst   . Renal disorder    kidney stones    Current Outpatient Medications:  .  acetaminophen (TYLENOL) 500 MG tablet, Take 500 mg by mouth every 6 (six) hours as needed for mild pain., Disp: , Rfl:  .  azithromycin  (ZITHROMAX Z-PAK) 250 MG tablet, As directed, Disp: 1 each, Rfl: 0 .  benzonatate (TESSALON) 200 MG capsule, Take 1 capsule (200 mg total) by mouth 2 (two) times daily as needed for cough. (Patient not taking: Reported on 02/27/2017), Disp: 20 capsule, Rfl: 0 .  omeprazole (PRILOSEC OTC) 20 MG tablet, Take 20 mg by mouth daily., Disp: , Rfl:  .  promethazine (PHENERGAN) 25 MG tablet, Take 1 tablet (25 mg total) by mouth every 6 (six) hours as needed for nausea or vomiting. May take an additional 1/2 tablet if needed., Disp: 30 tablet, Rfl: 5 Social History   Socioeconomic History  . Marital status: Married    Spouse name: Not on file  . Number of children: 4  . Years of education: Not on file  . Highest education level: Not on file  Social Needs  . Financial resource strain: Not on file  . Food insecurity - worry: Not on file  . Food insecurity - inability: Not on file  . Transportation needs - medical: Not on file  . Transportation needs - non-medical: Not on file  Occupational History    Employer: [employer]  Tobacco Use  . Smoking status: Current Every Day Smoker    Packs/day: 1.00    Years: 35.00    Pack years: 35.00    Types: Cigarettes  . Smokeless tobacco: Never Used  . Tobacco comment: Smokes about a pack  a day  Substance and Sexual Activity  . Alcohol use: Yes    Alcohol/week: 0.0 oz    Comment: socially/rarely  . Drug use: No  . Sexual activity: Not on file  Other Topics Concern  . Not on file  Social History Narrative   Lives at home with husband   Family History  Problem Relation Age of Onset  . Heart disease Father        Pacemaker  . CAD Mother 54       CABG  . Cancer Mother        Died age 55 of colon CA  . Atrial fibrillation Sister     Objective: Office vital signs reviewed. BP 120/82   Pulse 65   Temp 97.9 F (36.6 C) (Oral)   Ht 5\' 5"  (1.651 m)   Wt 156 lb (70.8 kg)   BMI 25.96 kg/m   Physical Examination:  General: Awake, alert, well  appearing, No acute distress GI: flat, soft, non-tender, non-distended, bowel sounds present x4, no hepatomegaly, no splenomegaly, no masses Neuro: No tremor, facial twitching  Assessment/ Plan: 57 y.o. female   1. Chronic nausea I refilled patient's promethazine.  However, I did recommend that she see her gastroenterologist given her ongoing nausea without clear diagnosis.  We did discuss the current use of promethazine can result in dystonic reaction.  Patient does not demonstrate this during today's exam.  I have refilled a small quantity of promethazine for patient.  Defer further refills to PCP or gastroenterology should they feel that this is clinically indicated.  Would consider increasing omeprazole to 40 mg daily to see if this might improve patient's symptoms.  Meds ordered this encounter  Medications  . promethazine (PHENERGAN) 25 MG tablet    Sig: Take 1 tablet (25 mg total) by mouth every 6 (six) hours as needed for nausea or vomiting. May take an additional 1/2 tablet if needed.    Dispense:  30 tablet    Refill:  [place], DO [place] [phone]

## 2017-04-10 NOTE — Patient Instructions (Signed)
As we discussed, chronic use of Promethazine can lead to dystonic reactions.  Please follow up with Dr Livia Snellen [facility] for further management.  Dystonia Dystonia is a condition that makes your muscles contract without warning (muscle spasms). It can make doing everyday tasks hard. There are different forms of dystonia. The condition can affect just one part of your body, or it can affect larger areas of your body. Dystonia affects people in different ways. In some people, it is mild and goes away over time, while others may need treatment. Although there is no cure for dystonia, you can manage the condition with treatment. What are the causes? Dystonia may be caused by:  Genetics. This means you inherited the genes that cause you to be at risk for dystonia.  An abnormality in the part of your brain that controls movement (basal ganglia).  Dystonia may also be acquired. If you have acquired dystonia, you developed the condition after:  Brain injury.  Infection.  Drug reaction.  The cause of dystonia may also not be known (idiopathic dystonia). What are the signs or symptoms? Signs and symptoms of dystonia can depend on which type of the condition you have. Common signs and symptoms include:  Muscle twitches or spasms around your eyes (blepharospasm).  Foot cramping or dragging.  Pulling of your neck to one side (torticollis).  Muscles spasms of the face.  Spasms of the voice box.  Tremors.  Awkward and painful positions.  Muscle cramping after muscle use.  How is this diagnosed? Your health care provider can diagnose dystonia based on your symptoms and medical history. Your health care provider will also do a physical exam. You may also have:  A blood test to check for genes that cause dystonia.  Brain imaging tests to rule out other causes of your symptoms.  There are no tests that can diagnose other causes of dystonia. How is this treated? There are no  treatments that can cure or prevent dystonia. Treatment to manage dystonia may include:  Injecting the affected muscles with a chemical (botulinum) that blocks muscle spasms. This treatment can block spasms for a few days to a few months.  Medicines to relax muscles.  Follow these instructions at home:  Physical therapy to improve muscle strength and movement may be suggested by your health care provider. Continue your physical therapy exercises at home as instructed by your physical therapist.  Make sure you have a good support system. Let your health care provider know if you are struggling with stress or anxiety.  Keep all follow-up visits as directed by your health care provider. This is important.  Take medicines only as directed by your health care provider. Contact a health care provider if:  Your condition is changing or getting worse.  You need more support at home. This information is not intended to replace advice given to you by your health care provider. Make sure you discuss any questions you have with your health care provider. Document Released: 01/24/2002 Document Revised: 07/12/2015 Document Reviewed: 03/30/2013 Elsevier Interactive Patient Education  [employer].

## 2017-04-30 ENCOUNTER — Other Ambulatory Visit: Payer: Self-pay

## 2017-04-30 ENCOUNTER — Emergency Department (HOSPITAL_COMMUNITY): Payer: BLUE CROSS/BLUE SHIELD

## 2017-04-30 ENCOUNTER — Encounter (HOSPITAL_COMMUNITY): Payer: Self-pay

## 2017-04-30 ENCOUNTER — Emergency Department (HOSPITAL_COMMUNITY)
Admission: EM | Admit: 2017-04-30 | Discharge: 2017-04-30 | Disposition: A | Discharge status: Home / Self Care | Payer: BLUE CROSS/BLUE SHIELD | Attending: Emergency Medicine | Admitting: Emergency Medicine

## 2017-04-30 DIAGNOSIS — K529 Noninfective gastroenteritis and colitis, unspecified: Secondary | ICD-10-CM

## 2017-04-30 DIAGNOSIS — R350 Frequency of micturition: Secondary | ICD-10-CM | POA: Diagnosis not present

## 2017-04-30 DIAGNOSIS — Z79899 Other long term (current) drug therapy: Secondary | ICD-10-CM | POA: Diagnosis not present

## 2017-04-30 DIAGNOSIS — R3915 Urgency of urination: Secondary | ICD-10-CM | POA: Insufficient documentation

## 2017-04-30 DIAGNOSIS — R1032 Left lower quadrant pain: Secondary | ICD-10-CM

## 2017-04-30 DIAGNOSIS — F17210 Nicotine dependence, cigarettes, uncomplicated: Secondary | ICD-10-CM | POA: Diagnosis not present

## 2017-04-30 DIAGNOSIS — R110 Nausea: Secondary | ICD-10-CM | POA: Insufficient documentation

## 2017-04-30 DIAGNOSIS — R51 Headache: Secondary | ICD-10-CM | POA: Diagnosis not present

## 2017-04-30 DIAGNOSIS — R309 Painful micturition, unspecified: Secondary | ICD-10-CM | POA: Diagnosis not present

## 2017-04-30 LAB — URINALYSIS, ROUTINE W REFLEX MICROSCOPIC
Bacteria, UA: NONE SEEN
Bilirubin Urine: NEGATIVE
Glucose, UA: NEGATIVE mg/dL
Ketones, ur: NEGATIVE mg/dL
Leukocytes, UA: NEGATIVE
Nitrite: NEGATIVE
Protein, ur: NEGATIVE mg/dL
Specific Gravity, Urine: 1.004 — ABNORMAL LOW (ref 1.005–1.030)
pH: 7 (ref 5.0–8.0)

## 2017-04-30 LAB — CBC WITH DIFFERENTIAL/PLATELET
Basophils Absolute: 0 10*3/uL (ref 0.0–0.1)
Basophils Relative: 0 %
Eosinophils Absolute: 0.1 10*3/uL (ref 0.0–0.7)
Eosinophils Relative: 1 %
HCT: 38.7 % (ref 36.0–46.0)
Hemoglobin: 12.3 g/dL (ref 12.0–15.0)
Lymphocytes Relative: 17 %
Lymphs Abs: 1.9 10*3/uL (ref 0.7–4.0)
MCH: 29.8 pg (ref 26.0–34.0)
MCHC: 31.8 g/dL (ref 30.0–36.0)
MCV: 93.7 fL (ref 78.0–100.0)
Monocytes Absolute: 1.2 10*3/uL — ABNORMAL HIGH (ref 0.1–1.0)
Monocytes Relative: 10 %
Neutro Abs: 8 10*3/uL — ABNORMAL HIGH (ref 1.7–7.7)
Neutrophils Relative %: 72 %
Platelets: 239 10*3/uL (ref 150–400)
RBC: 4.13 MIL/uL (ref 3.87–5.11)
RDW: 11.6 % (ref 11.5–15.5)
WBC: 11.2 10*3/uL — ABNORMAL HIGH (ref 4.0–10.5)

## 2017-04-30 LAB — COMPREHENSIVE METABOLIC PANEL
ALT: 56 U/L — ABNORMAL HIGH (ref 14–54)
AST: 89 U/L — ABNORMAL HIGH (ref 15–41)
Albumin: 3.4 g/dL — ABNORMAL LOW (ref 3.5–5.0)
Alkaline Phosphatase: 95 U/L (ref 38–126)
Anion gap: 11 (ref 5–15)
BUN: 15 mg/dL (ref 6–20)
CO2: 23 mmol/L (ref 22–32)
Calcium: 8.7 mg/dL — ABNORMAL LOW (ref 8.9–10.3)
Chloride: 107 mmol/L (ref 101–111)
Creatinine, Ser: 0.65 mg/dL (ref 0.44–1.00)
GFR calc Af Amer: >60 mL/min (ref >60)
GFR calc non Af Amer: >60 mL/min (ref >60)
Glucose, Bld: 108 mg/dL — ABNORMAL HIGH (ref 65–99)
Potassium: 3.8 mmol/L (ref 3.5–5.1)
Sodium: 141 mmol/L (ref 135–145)
Total Bilirubin: 0.5 mg/dL (ref 0.3–1.2)
Total Protein: 6.6 g/dL (ref 6.5–8.1)

## 2017-04-30 LAB — LIPASE, BLOOD: Lipase: 45 U/L (ref 11–51)

## 2017-04-30 MED ORDER — MORPHINE SULFATE (PF) 4 MG/ML IV SOLN
4.0000 mg | Freq: Once | INTRAVENOUS | Status: AC
  Administered 2017-04-30: 4 mg via INTRAVENOUS
  Filled 2017-04-30: qty 1

## 2017-04-30 MED ORDER — IOPAMIDOL (ISOVUE-300) INJECTION 61%
100.0000 mL | Freq: Once | INTRAVENOUS | Status: AC | PRN
  Administered 2017-04-30: 100 mL via INTRAVENOUS

## 2017-04-30 MED ORDER — METRONIDAZOLE 500 MG PO TABS: 500.0000 mg | ORAL_TABLET | Freq: Two times a day (BID) | ORAL | 0 refills | Status: DC

## 2017-04-30 MED ORDER — ONDANSETRON HCL 4 MG/2ML IJ SOLN
4.0000 mg | Freq: Once | INTRAMUSCULAR | Status: AC
  Administered 2017-04-30: 4 mg via INTRAVENOUS
  Filled 2017-04-30: qty 2

## 2017-04-30 MED ORDER — ONDANSETRON HCL 4 MG/2ML IJ SOLN
4.0000 mg | Freq: Once | INTRAMUSCULAR | Status: AC
  Administered 2017-04-30: 4 mg via INTRAVENOUS

## 2017-04-30 MED ORDER — ONDANSETRON HCL 4 MG/2ML IJ SOLN
INTRAMUSCULAR | Status: AC
  Filled 2017-04-30: qty 2

## 2017-04-30 MED ORDER — SODIUM CHLORIDE 0.9 % IV BOLUS (SEPSIS)
1000.0000 mL | Freq: Once | INTRAVENOUS | Status: AC
  Administered 2017-04-30: 1000 mL via INTRAVENOUS

## 2017-04-30 MED ORDER — OXYCODONE-ACETAMINOPHEN 5-325 MG PO TABS
2.0000 | ORAL_TABLET | Freq: Once | ORAL | Status: AC
  Administered 2017-04-30: 2 via ORAL
  Filled 2017-04-30: qty 2

## 2017-04-30 MED ORDER — CIPROFLOXACIN IN D5W 400 MG/200ML IV SOLN
400.0000 mg | Freq: Once | INTRAVENOUS | Status: AC
  Administered 2017-04-30: 400 mg via INTRAVENOUS
  Filled 2017-04-30: qty 200

## 2017-04-30 MED ORDER — METRONIDAZOLE IN NACL 5-0.79 MG/ML-% IV SOLN
500.0000 mg | Freq: Once | INTRAVENOUS | Status: AC
Start: 2017-04-30 — End: 2017-04-30
  Administered 2017-04-30: 500 mg via INTRAVENOUS
  Filled 2017-04-30: qty 100

## 2017-04-30 MED ORDER — TRAMADOL HCL 50 MG PO TABS: 50.0000 mg | ORAL_TABLET | Freq: Four times a day (QID) | ORAL | 0 refills | Status: DC | PRN

## 2017-04-30 MED ORDER — CIPROFLOXACIN HCL 500 MG PO TABS: 500.0000 mg | ORAL_TABLET | Freq: Two times a day (BID) | ORAL | 0 refills | Status: DC

## 2017-04-30 MED ORDER — ONDANSETRON 4 MG PO TBDP: 4.0000 mg | ORAL_TABLET | Freq: Three times a day (TID) | ORAL | 0 refills | Status: DC | PRN

## 2017-04-30 NOTE — Discharge Instructions (Addendum)
Your testing is reassuring. Take the antibiotics for colitis as prescribed. Followup with your doctor next week. Return to the ED if you develop new or worsening symptoms. Follow up with your gastroenterologist in 2-3 weeks

## 2017-04-30 NOTE — ED Provider Notes (Signed)
[facility] EMERGENCY DEPARTMENT Provider Note   CSN: [id number] Arrival date & time: 04/30/17  0354     History   Chief Complaint Chief Complaint  Patient presents with  . Abdominal Pain    HPI Carol [name] is a 57 y.o. female.  Patient with left-sided lower abdominal pain is been progressively worsening for the past 2 days.  Associated with nausea but no vomiting.  Has some urinary frequency and urgency and pain with urination.  Does have a history of diverticulitis as well as ovarian cyst in the past.  She is not sure what this pain feels like.  Nothing makes it better or worse.  She had some diarrhea several days ago but had a normal bowel movement yesterday that was well formed with some tinges of blood.  She also complains of gradual onset left-sided headache since her abdominal pain started.  No chest pain or shortness of breath.  No fever.  No abnormal bleeding or vaginal discharge.   The history is provided by the patient and the spouse.  Abdominal Pain   Associated symptoms include nausea, dysuria and frequency. Pertinent negatives include fever, vomiting, hematuria, headaches, arthralgias and myalgias.    Past Medical History:  Diagnosis Date  . Chronic nausea   . Diverticula of colon   . GERD (gastroesophageal reflux disease)   . Ovarian cyst   . Renal disorder    kidney stones    Patient Active Problem List   Diagnosis Date Noted  . GERD (gastroesophageal reflux disease) 05/09/2014  . Mucosal abnormality of stomach   . Chronic nausea 02/07/2014  . Dyspepsia 02/07/2014  . History of colonic polyps 02/07/2014  . Menopausal state 01/02/2014  . Chronic neck pain 08/01/2013  . Numbness and tingling of right arm 08/01/2013  . DYSURIA 04/19/2008  . ABDOMINAL PAIN, SUPRAPUBIC 04/19/2008    Past Surgical History:  Procedure Laterality Date  . APPENDECTOMY  2010  . BREAST LUMPECTOMY     papilloma per pathology  . CHOLECYSTECTOMY  1997  . COLONOSCOPY  2010     repeat in 5 years; family hx colon ca and polypectomy  . COLONOSCOPY N/A 03/09/2014   Dr. Gala Romney: tubular adenoma, pancolonic diverticulosis. Surveillance due in 2021.   . ESOPHAGOGASTRODUODENOSCOPY N/A 03/09/2014   Dr. Gala Romney: non-critical Schatzki's ring, not manipulated, mild erosive reflux esophagitis, gastric and duodenal erosions with negative H.pylori  . KIDNEY STONE SURGERY    . SHOULDER SURGERY    . TUBAL LIGATION      OB History    Gravida Para Term Preterm AB Living             4   SAB TAB Ectopic Multiple Live Births                   Home Medications    Prior to Admission medications   Medication Sig Start Date End Date Taking? Authorizing Provider  omeprazole (PRILOSEC OTC) 20 MG tablet Take 20 mg by mouth daily.    [provider]  promethazine (PHENERGAN) 25 MG tablet Take 1 tablet (25 mg total) by mouth every 6 (six) hours as needed for nausea or vomiting. May take an additional 1/2 tablet if needed. 04/10/17   Janora Norlander, DO    Family History Family History  Problem Relation Age of Onset  . Heart disease Father        Pacemaker  . CAD Mother 57       CABG  .  Cancer Mother        Died age 22 of colon CA  . Atrial fibrillation Sister     Social History Social History   Tobacco Use  . Smoking status: Current Every Day Smoker    Packs/day: 1.00    Years: 35.00    Pack years: 35.00    Types: Cigarettes  . Smokeless tobacco: Never Used  . Tobacco comment: Smokes about a pack a day  Substance Use Topics  . Alcohol use: Yes    Alcohol/week: 0.0 oz    Comment: socially/rarely  . Drug use: No     Allergies   Penicillins and Sulfa antibiotics   Review of Systems Review of Systems  Constitutional: Positive for activity change and appetite change. Negative for fever.  HENT: Negative for congestion and rhinorrhea.   Respiratory: Negative for cough, chest tightness and shortness of breath.   Gastrointestinal: Positive for abdominal  pain, blood in stool and nausea. Negative for vomiting.  Genitourinary: Positive for dysuria, frequency and urgency. Negative for hematuria, vaginal bleeding and vaginal discharge.  Musculoskeletal: Negative for arthralgias and myalgias.  Skin: Negative for rash.  Neurological: Negative for dizziness, weakness and headaches.   all other systems are negative except as noted in the HPI and PMH.     Physical Exam Updated Vital Signs BP 116/81 (BP Location: Left Arm)   Pulse 76   Temp 98.1 F (36.7 C) (Oral)   Resp 17   Ht 5\' 5"  (1.651 m)   Wt 70.8 kg (156 lb)   SpO2 98%   BMI 25.96 kg/m   Physical Exam  Constitutional: She is oriented to person, place, and time. She appears well-developed and well-nourished. No distress.  HENT:  Head: Normocephalic and atraumatic.  Mouth/Throat: Oropharynx is clear and moist. No oropharyngeal exudate.  Eyes: Conjunctivae and EOM are normal. Pupils are equal, round, and reactive to light.  Neck: Normal range of motion. Neck supple.  No meningismus.  Cardiovascular: Normal rate, regular rhythm, normal heart sounds and intact distal pulses.  No murmur heard. Pulmonary/Chest: Effort normal and breath sounds normal. No respiratory distress.  Abdominal: Soft. There is tenderness. There is no rebound and no guarding.  TTP LLQ and suprapubic with voluntary guarding  Musculoskeletal: Normal range of motion. She exhibits no edema or tenderness.  No CVAT  Neurological: She is alert and oriented to person, place, and time. No cranial nerve deficit. She exhibits normal muscle tone. Coordination normal.   5/5 strength throughout. CN 2-12 intact.Equal grip strength.   Skin: Skin is warm.  Psychiatric: She has a normal mood and affect. Her behavior is normal.  Nursing note and vitals reviewed.    ED Treatments / Results  Labs (all labs ordered are listed, but only abnormal results are displayed) Labs Reviewed  URINALYSIS, ROUTINE W REFLEX MICROSCOPIC -  Abnormal; Notable for the following components:      Result Value   Color, Urine STRAW (*)    Specific Gravity, Urine 1.004 (*)    Hgb urine dipstick SMALL (*)    Squamous Epithelial / LPF 0-5 (*)    All other components within normal limits  CBC WITH DIFFERENTIAL/PLATELET - Abnormal; Notable for the following components:   WBC 11.2 (*)    Neutro Abs 8.0 (*)    Monocytes Absolute 1.2 (*)    All other components within normal limits  COMPREHENSIVE METABOLIC PANEL - Abnormal; Notable for the following components:   Glucose, Bld 108 (*)  Calcium 8.7 (*)    Albumin 3.4 (*)    AST 89 (*)    ALT 56 (*)    All other components within normal limits  LIPASE, BLOOD  POC OCCULT BLOOD, ED    EKG  EKG Interpretation None       Radiology Ct Abdomen Pelvis W Contrast  Result Date: 04/30/2017 CLINICAL DATA:  Abdominal pain EXAM: CT ABDOMEN AND PELVIS WITH CONTRAST TECHNIQUE: Multidetector CT imaging of the abdomen and pelvis was performed using the standard protocol following bolus administration of intravenous contrast. CONTRAST:  176mL ISOVUE-300 IOPAMIDOL (ISOVUE-300) INJECTION 61% COMPARISON:  CT abdomen pelvis 09/15/2016 FINDINGS: Lower chest: No basilar pulmonary nodules or pleural effusion. No apical pericardial effusion. Hepatobiliary: Normal hepatic contours and density. No visible biliary dilatation. Status post cholecystectomy. Pancreas: Normal parenchymal contours without ductal dilatation. No peripancreatic fluid collection. Spleen: Normal. Adrenals/Urinary Tract: --Adrenal glands: Normal. --Right kidney/ureter: Nonobstructive 3 mm interpolar calculus. No solid mass. No hydronephrosis. --Left kidney/ureter: Multiple punctate nonobstructive calculi. No hydronephrosis. No solid mass. --Urinary bladder: Normal appearance for the degree of distention. Stomach/Bowel: --Stomach/Duodenum: No hiatal hernia or other gastric abnormality. Normal duodenal course. --Small bowel: No dilatation or  inflammation. --Colon: There is inflammatory change surrounding the midportion of the transverse colon. There is no free intraperitoneal air. No abscess or fluid collection. --Appendix: Surgically absent. Vascular/Lymphatic: Atherosclerotic calcification is present within the non-aneurysmal abdominal aorta, without hemodynamically significant stenosis. The portal vein, splenic vein, superior mesenteric vein and IVC are patent. No abdominal or pelvic lymphadenopathy. Reproductive: Normal uterus and ovaries. Musculoskeletal. Multilevel degenerative disc disease and facet arthrosis. No bony spinal canal stenosis. Other: None. IMPRESSION: 1. Short segment acute colitis of the mid transverse colon. 2. No free intraperitoneal air, abscess or fluid collection. 3. Bilateral nonobstructive nephrolithiasis. Electronically Signed   By: Ulyses Jarred M.D.   On: 04/30/2017 06:31    Procedures Procedures (including critical care time)  Medications Ordered in ED Medications  ondansetron (ZOFRAN) injection 4 mg (not administered)  morphine 4 MG/ML injection 4 mg (not administered)  sodium chloride 0.9 % bolus 1,000 mL (not administered)     Initial Impression / Assessment and Plan / ED Course  I have reviewed the triage vital signs and the nursing notes.  Pertinent labs & imaging results that were available during my care of the patient were reviewed by me and considered in my medical decision making (see chart for details).    Patient with 2 days of lower abdominal pain associated with intermittent nausea and diarrhea.  She is also had some urgency and frequency.  No fever.  Vitals stable.   Labs are reassuring.  Small hemoglobin in urine without evidence of infection.  CT scan obtained to evaluate for diverticulitis. Consider possible passed kidney stone.  There is evidence of segmental transverse colitis.  This does not appear to explain patient's lower abdominal pain.  Will treat with IV Cipro and  Flagyl.  Given her ongoing adnexal pain in the left side will obtain ultrasound to evaluate for ovarian pathology.  Will treat for colitis with antibiotics. Care transferred to Dr. Roderic Palau at shift change with ultrasound pending. Final Clinical Impressions(s) / ED Diagnoses   Final diagnoses:  Colitis  Left lower quadrant pain    ED Discharge Orders    None       Tyleah Loh, Annie Main, MD 04/30/17 [phone]

## 2017-04-30 NOTE — ED Triage Notes (Signed)
Lower abdominal pain that started yesterday. Headache also started yesterday. Intermittent diarrhea over the past few days, none today, last BM this morning blood tinged and soft per pt report. Pt also reports urinary symptoms including frequency and urgency.

## 2017-04-30 NOTE — ED Notes (Signed)
Patient transported to CT 

## 2017-04-30 NOTE — ED Notes (Signed)
Tolerating po fluids

## 2017-06-24 ENCOUNTER — Telehealth: Payer: Self-pay | Admitting: Family Medicine

## 2017-12-18 LAB — HM MAMMOGRAPHY

## 2017-12-21 ENCOUNTER — Encounter: Payer: Self-pay | Admitting: *Deleted

## 2017-12-21 NOTE — Progress Notes (Signed)
Patient's account is on hold because of an overdue balance. We can still order the necessary follow up testing on this mammogram.

## 2018-01-08 ENCOUNTER — Ambulatory Visit (INDEPENDENT_AMBULATORY_CARE_PROVIDER_SITE_OTHER): Payer: BLUE CROSS/BLUE SHIELD | Admitting: Family

## 2018-01-08 ENCOUNTER — Encounter: Payer: Self-pay | Admitting: Family

## 2018-01-08 VITALS — BP 118/76 | HR 73 | Temp 98.1°F | Ht 65.0 in | Wt 151.8 lb

## 2018-01-08 DIAGNOSIS — K047 Periapical abscess without sinus: Secondary | ICD-10-CM | POA: Diagnosis not present

## 2018-01-08 DIAGNOSIS — B070 Plantar wart: Secondary | ICD-10-CM

## 2018-01-08 MED ORDER — TRAMADOL HCL 50 MG PO TABS: 50.0000 mg | ORAL_TABLET | Freq: Two times a day (BID) | ORAL | 0 refills | Status: DC | PRN

## 2018-01-08 MED ORDER — CLINDAMYCIN HCL 300 MG PO CAPS: 300.0000 mg | ORAL_CAPSULE | Freq: Three times a day (TID) | ORAL | 0 refills | Status: DC

## 2018-01-08 NOTE — Patient Instructions (Signed)
Dental Abscess A dental abscess is a collection of pus in or around a tooth. What are the causes? This condition is caused by a bacterial infection around the root of the tooth that involves the inner part of the tooth (pulp). It may result from:  Severe tooth decay.  Trauma to the tooth that allows bacteria to enter into the pulp, such as a broken or chipped tooth.  Severe gum disease around a tooth.  What are the signs or symptoms? Symptoms of this condition include:  Severe pain in and around the infected tooth.  Swelling and redness around the infected tooth, in the mouth, or in the face.  Tenderness.  Pus drainage.  Bad breath.  Bitter taste in the mouth.  Difficulty swallowing.  Difficulty opening the mouth.  Nausea.  Vomiting.  Chills.  Swollen neck glands.  Fever.  How is this diagnosed? This condition is diagnosed with examination of the infected tooth. During the exam, your dentist may tap on the infected tooth. Your dentist will also ask about your medical and dental history and may order X-rays. How is this treated? This condition is treated by eliminating the infection. This may be done with:  Antibiotic medicine.  A root canal. This may be performed to save the tooth.  Pulling (extracting) the tooth. This may also involve draining the abscess. This is done if the tooth cannot be saved.  Follow these instructions at home:  Take medicines only as directed by your dentist.  If you were prescribed antibiotic medicine, finish all of it even if you start to feel better.  Rinse your mouth (gargle) often with salt water to relieve pain or swelling.  Do not drive or operate heavy machinery while taking pain medicine.  Do not apply heat to the outside of your mouth.  Keep all follow-up visits as directed by your dentist. This is important. Contact a health care provider if:  Your pain is worse and is not helped by medicine. Get help right away  if:  You have a fever or chills.  Your symptoms suddenly get worse.  You have a very bad headache.  You have problems breathing or swallowing.  You have trouble opening your mouth.  You have swelling in your neck or around your eye. This information is not intended to replace advice given to you by your health care provider. Make sure you discuss any questions you have with your health care provider. Document Released: 02/03/2005 Document Revised: 06/14/2015 Document Reviewed: 01/31/2014 Elsevier Interactive Patient Education  2018 [employer]  

## 2018-01-08 NOTE — Progress Notes (Signed)
   Subjective:    Patient ID: Carol [name], female    DOB: 05-Apr-1960, 57 y.o.   MRN: [id number]  Chief Complaint  Patient presents with  . gum infection   Pt presents to the office today with right lower gum pain that started several weeks ago after one of her "fillings fell out". She does not have a dentist.   She is also complaining of a "place on my toe that hurts". She noticed this spot on her right great toe several weeks ago, but is becoming larger and causing pain while walking.  Dental Pain   This is a new problem. The current episode started 1 to 4 weeks ago. The problem occurs constantly. The problem has been rapidly worsening. The pain is at a severity of 10/10. The pain is moderate. Associated symptoms include facial pain. Pertinent negatives include no fever. She has tried acetaminophen, NSAIDs and rest for the symptoms. The treatment provided mild relief.      Review of Systems  Constitutional: Negative for fever.  All other systems reviewed and are negative.      Objective:   Physical Exam  Constitutional: She is oriented to person, place, and time. She appears well-developed and well-nourished. No distress.  HENT:  Head: Normocephalic and atraumatic.  Right Ear: External ear normal.  Left Ear: External ear normal.  Mouth/Throat: Oropharynx is clear and moist. Dental abscesses present.    Eyes: Pupils are equal, round, and reactive to light.  Neck: Normal range of motion. Neck supple. No thyromegaly present.  Cardiovascular: Normal rate, regular rhythm, normal heart sounds and intact distal pulses.  No murmur heard. Pulmonary/Chest: Effort normal and breath sounds normal. No respiratory distress. She has no wheezes.  Abdominal: Soft. Bowel sounds are normal. She exhibits no distension. There is no tenderness.  Musculoskeletal: Normal range of motion. She exhibits no edema or tenderness.  Neurological: She is alert and oriented to person, place, and time. She has  normal reflexes. No cranial nerve deficit.  Skin: Skin is warm and dry.  Psychiatric: She has a normal mood and affect. Her behavior is normal. Judgment and thought content normal.  Vitals reviewed.   Cryothearpy used on plantar wart on base of right great toe. Pt tolerated well.   BP 118/76   Pulse 73   Temp 98.1 F (36.7 C) (Oral)   Ht 5\' 5"  (1.651 m)   Wt 151 lb 12.8 oz (68.9 kg)   BMI 25.26 kg/m      Assessment & Plan:  DESIA SABAN comes in today with chief complaint of gum infection   Diagnosis and orders addressed:  1. Dental abscess Make dental appt!! Gargle with warm water after meals Discussed we would only give Ultram to help her with her pain for a few days Follow up with PCP - traMADol (ULTRAM) 50 MG tablet; Take 1-2 tablets (50-100 mg total) by mouth every 12 (twelve) hours as needed.  Dispense: 15 tablet; Refill: 0 - clindamycin (CLEOCIN) 300 MG capsule; Take 1 capsule (300 mg total) by mouth 3 (three) times daily.  Dispense: 30 capsule; Refill: 0  2. Plantar wart of right foot Area may blister Keep clean and dry Do not pick at    Evelina Dun, [place]

## 2018-01-13 LAB — HM MAMMOGRAPHY

## 2018-05-05 ENCOUNTER — Other Ambulatory Visit: Payer: Self-pay

## 2018-05-05 ENCOUNTER — Ambulatory Visit (INDEPENDENT_AMBULATORY_CARE_PROVIDER_SITE_OTHER): Payer: BLUE CROSS/BLUE SHIELD | Admitting: Family Medicine

## 2018-05-05 ENCOUNTER — Encounter: Payer: Self-pay | Admitting: Family Medicine

## 2018-05-05 VITALS — BP 113/70 | HR 80 | Temp 99.7°F | Ht 65.0 in | Wt 144.4 lb

## 2018-05-05 DIAGNOSIS — J069 Acute upper respiratory infection, unspecified: Secondary | ICD-10-CM | POA: Diagnosis not present

## 2018-05-05 DIAGNOSIS — R059 Cough, unspecified: Secondary | ICD-10-CM

## 2018-05-05 DIAGNOSIS — J301 Allergic rhinitis due to pollen: Secondary | ICD-10-CM

## 2018-05-05 DIAGNOSIS — R05 Cough: Secondary | ICD-10-CM | POA: Diagnosis not present

## 2018-05-05 LAB — VERITOR FLU A/B WAIVED
Influenza A: NEGATIVE
Influenza B: NEGATIVE

## 2018-05-05 NOTE — Progress Notes (Signed)
Chief Complaint  Patient presents with  . Cough    pt here today c/o cough, congestion and has been at the hospital with husband who just tested positive for Flu    HPI  Patient presents today for patient presents with dry cough runny stuffy nose.Slight headache. Patient denies fever. No Body aches . Energy is good. No sore throat.  PMH: Smoking status noted ROS: Per HPI  Objective: BP 113/70   Pulse 80   Temp 99.7 F (37.6 C) (Oral)   Ht 5\' 5"  (1.651 m)   Wt 144 lb 6 oz (65.5 kg)   BMI 24.03 kg/m  Gen: NAD, alert, cooperative with exam HEENT: NCAT, EOMI, PERRL CV: RRR, good S1/S2, no murmur Resp: CTABL, no wheezes, non-labored Abd: SNTND, BS present, no guarding or organomegaly Ext: No edema, warm Neuro: Alert and oriented, No gross deficits  Assessment and plan:  1. Viral URI   2. Cough   3. Seasonal allergic rhinitis due to pollen     Does not meet criteria for COVID 19 test. Follow up if fever goes to 101 or dyspnea occurs. Use OTC preparations such as Dayquil and Nyquil. Wear mask to visit husband in the hospital.  Orders Placed This Encounter  Procedures  . Veritor Flu A/B Waived    Order Specific Question:   Source    Answer:   nasal    Follow up as needed.  Claretta Fraise, MD

## 2018-06-25 ENCOUNTER — Telehealth: Payer: Self-pay | Admitting: Family Medicine

## 2018-11-03 ENCOUNTER — Other Ambulatory Visit: Payer: Self-pay

## 2018-11-03 ENCOUNTER — Ambulatory Visit (INDEPENDENT_AMBULATORY_CARE_PROVIDER_SITE_OTHER): Payer: BLUE CROSS/BLUE SHIELD | Admitting: Family Medicine

## 2018-11-03 ENCOUNTER — Ambulatory Visit (INDEPENDENT_AMBULATORY_CARE_PROVIDER_SITE_OTHER): Payer: BLUE CROSS/BLUE SHIELD

## 2018-11-03 ENCOUNTER — Encounter: Payer: Self-pay | Admitting: Family Medicine

## 2018-11-03 VITALS — BP 126/76 | HR 69 | Temp 98.4°F | Resp 16 | Ht 65.0 in | Wt 150.0 lb

## 2018-11-03 DIAGNOSIS — M546 Pain in thoracic spine: Secondary | ICD-10-CM

## 2018-11-03 MED ORDER — PREDNISONE 10 MG (21) PO TBPK: ORAL_TABLET | ORAL | 0 refills | Status: DC

## 2018-11-03 MED ORDER — TIZANIDINE HCL 4 MG PO TABS: 2.0000 mg | ORAL_TABLET | Freq: Four times a day (QID) | ORAL | 0 refills | Status: DC | PRN

## 2018-11-03 NOTE — Patient Instructions (Signed)
If symptoms do not improve, I recommend that you see an orthopedist given your history of degenerative neck.  Your pain may be related to your neck.  I will call you in the next day or so with the results of your xray.   Thoracic Strain A thoracic strain, which is sometimes called a mid-back strain, is an injury to the muscles or tendons that attach to the upper part of your back behind your chest. This type of injury occurs when a muscle is overstretched or overloaded. Thoracic strains can range from mild to severe. Mild strains may involve stretching a muscle or tendon without tearing it. These injuries may heal in 1-2 weeks. More severe strains involve tearing of muscle fibers or tendons. These will cause more pain and may take 6-8 weeks to heal. What are the causes? This condition may be caused by:  Trauma, such as a fall or a hit to the body.  Twisting or overstretching the back. This may result from doing activities that require a lot of energy, such as lifting heavy objects. In some cases, the cause may not be known. What increases the risk? This injury is more common in:  Athletes.  People with obesity. What are the signs or symptoms? The main symptom of this condition is pain in the middle back, especially with movement. Other symptoms include:  Stiffness or limited range of motion.  Sudden muscle tightening (spasms). How is this diagnosed? This condition may be diagnosed based on:  Your symptoms.  Your medical history.  A physical exam.  Imaging tests, such as X-rays or an MRI. How is this treated? This condition may be treated with:  Resting the injured area.  Applying heat and cold to the injured area.  Over-the-counter medicines for pain and inflammation, such as NSAIDs.  Prescription pain medicine or muscle relaxants may be needed for a short time.  Physical therapy. This will involve doing stretching and strengthening exercises. Follow these  instructions at home: Managing pain, stiffness, and swelling      If directed, put ice on the injured area. ? Put ice in a plastic bag. ? Place a towel between your skin and the bag. ? Leave the ice on for 20 minutes, 2-3 times a day.  If directed, apply heat to the affected area as often as told by your health care provider. Use the heat source that your health care provider recommends, such as a moist heat pack or a heating pad. ? Place a towel between your skin and the heat source. ? Leave the heat on for 20-30 minutes. ? Remove the heat if your skin turns bright red. This is especially important if you are unable to feel pain, heat, or cold. You may have a greater risk of getting burned. Activity  Rest and return to your normal activities as told by your health care provider. Ask your health care provider what activities are safe for you.  Do exercises as told by your health care provider. Medicines  Take over-the-counter and prescription medicines only as told by your health care provider.  Ask your health care provider if the medicine prescribed to you: ? Requires you to avoid driving or using heavy machinery. ? Can cause constipation. You may need to take these actions to prevent or treat constipation:  Drink enough fluid to keep your urine pale yellow.  Take over-the-counter or prescription medicines.  Eat foods that are high in fiber, such as beans, whole grains, and fresh fruits  and vegetables.  Limit foods that are high in fat and processed sugars, such as fried or sweet foods. Injury prevention To prevent a future mid-back injury:  Always warm up properly before physical activity or sports.  Cool down and stretch after being active.  Use correct form when playing sports and lifting heavy objects. Bend your knees before you lift heavy objects.  Use good posture when sitting and standing.  Stay physically fit and maintain a healthy weight. ? Do at least 150  minutes of moderate-intensity exercise each week, such as brisk walking or water aerobics. ? Do strength exercises at least 2 times each week.  General instructions  Do not use any products that contain nicotine or tobacco, such as cigarettes, e-cigarettes, and chewing tobacco. If you need help quitting, ask your health care provider.  Keep all follow-up visits as told by your health care provider. This is important. Contact a health care provider if:  Your pain is not helped by medicine.  Your pain or stiffness is getting worse.  You develop pain or stiffness in your neck or lower back. Get help right away if you:  Have shortness of breath.  Have chest pain.  Develop numbness or weakness in your legs or arms.  Have involuntary loss of urine (urinary incontinence). Summary  A thoracic strain, which is sometimes called a mid-back strain, is an injury to the muscles or tendons that attach to the upper part of your back behind your chest.  This type of injury occurs when a muscle is overstretched or overloaded.  Rest and return to your normal activities as told by your health care provider. If directed, apply heat or ice to the affected area as often as told by your health care provider.  Take over-the-counter and prescription medicines only as told by your health care provider.  Contact a health care provider if you have new or worsening symptoms. This information is not intended to replace advice given to you by your health care provider. Make sure you discuss any questions you have with your health care provider. Document Released: 04/26/2003 Document Revised: 12/22/2017 Document Reviewed: 12/22/2017 Elsevier Patient Education  2020 [employer].

## 2018-11-03 NOTE — Progress Notes (Signed)
Subjective: CC: Upper back pain PCP: Claretta Fraise, MD [id number] Carol [name] is a 58 y.o. female presenting to clinic today for:  1.  Upper back pain Patient reports about a 2-week history of upper back pain.  She describes it as a burning pain that is 9/10 in intensity.  She has been using Tylenol with little improvement in symptoms.  Denies any upper extremity weakness, electric sensation or numbness and tingling.  No preceding injury.  She notes that pain seem to onset after she returned to work.  She works in Geologist, engineering at [employer] but had been absent from work for about 5 months due to her husband's illness.  Her medical history is significant for degenerative disc disease of the C-spine as well as rotator cuff and bicep surgery in 2016.  She is unable to tolerate oral NSAIDs due to GI upset.   ROS: Per HPI  Allergies  Allergen Reactions  . Penicillins Shortness Of Breath and Swelling    Has patient had a PCN reaction causing immediate rash, facial/tongue/throat swelling, SOB or lightheadedness with hypotension: Yes Has patient had a PCN reaction causing severe rash involving mucus membranes or skin necrosis: No Has patient had a PCN reaction that required hospitalization No Has patient had a PCN reaction occurring within the last 10 years: No If all of the above answers are "NO", then may proceed with Cephalosporin use.   . Sulfa Antibiotics     Nausea, itching, headache   Past Medical History:  Diagnosis Date  . Chronic nausea   . Diverticula of colon   . GERD (gastroesophageal reflux disease)   . Ovarian cyst   . Renal disorder    kidney stones    Current Outpatient Medications:  .  acetaminophen (TYLENOL) 500 MG tablet, Take 1,000 mg by mouth every 6 (six) hours as needed for moderate pain., Disp: , Rfl:  .  fluticasone (FLONASE) 50 MCG/ACT nasal spray, Place 1-2 sprays into both nostrils daily as needed for allergies or rhinitis., Disp: , Rfl:  .  omeprazole (PRILOSEC OTC)  20 MG tablet, Take 20 mg by mouth daily., Disp: , Rfl:  .  promethazine (PHENERGAN) 25 MG tablet, Take 1 tablet (25 mg total) by mouth every 6 (six) hours as needed for nausea or vomiting. May take an additional 1/2 tablet if needed., Disp: 30 tablet, Rfl: 0 Social History   Socioeconomic History  . Marital status: Married    Spouse name: Not on file  . Number of children: 4  . Years of education: Not on file  . Highest education level: Not on file  Occupational History    Employer: [employer]  . Financial resource strain: Not on file  . Food insecurity    Worry: Not on file    Inability: Not on file  . Transportation needs    Medical: Not on file    Non-medical: Not on file  Tobacco Use  . Smoking status: Current Every Day Smoker    Packs/day: 1.00    Years: 35.00    Pack years: 35.00    Types: Cigarettes  . Smokeless tobacco: Never Used  . Tobacco comment: Smokes about a pack a day  Substance and Sexual Activity  . Alcohol use: Yes    Alcohol/week: 0.0 standard drinks    Comment: socially/rarely  . Drug use: No  . Sexual activity: Not on file  Lifestyle  . Physical activity    Days per week: Not on file  Minutes per session: Not on file  . Stress: Not on file  Relationships  . Social Herbalist on phone: Not on file    Gets together: Not on file    Attends religious service: Not on file    Active member of club or organization: Not on file    Attends meetings of clubs or organizations: Not on file    Relationship status: Not on file  . Intimate partner violence    Fear of current or ex partner: Not on file    Emotionally abused: Not on file    Physically abused: Not on file    Forced sexual activity: Not on file  Other Topics Concern  . Not on file  Social History Narrative   Lives at home with husband   Family History  Problem Relation Age of Onset  . Heart disease Father        Pacemaker  . CAD Mother 15       CABG  . Cancer  Mother        Died age 45 of colon CA  . Atrial fibrillation Sister     Objective: Office vital signs reviewed. BP 126/76   Pulse 69   Temp 98.4 F (36.9 C) (Temporal)   Resp 16   Ht 5\' 5"  (1.651 m)   Wt 150 lb (68 kg)   SpO2 97%   BMI 24.96 kg/m   Physical Examination:  General: Awake, alert, well nourished, No acute distress Extremities: warm, well perfused, No edema, cyanosis or clubbing; +2 pulses bilaterally MSK: normal gait and station  C-spine: Has reduced extension, flexion and rotation bilaterally.  No midline tenderness to palpation.  Thoracic spine: Patient has full active range of motion but does have pain with range of motion; she has midline tenderness palpation at around the T1-T2 level.  No palpable bony abnormalities.  She has mild paraspinal muscle tenderness to palpation in this region as well.  She has full active range of motion of bilateral upper extremities but does have pain with abduction. Skin: dry; intact; no rashes or lesions Neuro: 5/5 upper extremity strength and light touch sensation grossly intact  Assessment/ Plan: 58 y.o. female   1. Acute midline thoracic back pain I suspect referred pain and spasm from the degenerative change in the C-spine.  However, will obtain x-rays to further evaluate and rule out osteoporotic fracture given midline tenderness to palpation on exam.  In the meantime, I am treating her with a prednisone Dosepak and oral muscle relaxer in efforts to improve pain.  I recommended stretching at home.  If no significant improvement or resolution, plan for referral to orthopedics for further evaluation.  Work note also provided asking for light duties for the next 2 weeks.   - DG Thoracic Spine 2 View; Future - predniSONE (STERAPRED UNI-PAK 21 TAB) 10 MG (21) TBPK tablet; As directed x 6 days  Dispense: 21 tablet; Refill: 0 - tiZANidine (ZANAFLEX) 4 MG tablet; Take 0.5-1 tablets (2-4 mg total) by mouth every 6 (six) hours as needed  for muscle spasms.  Dispense: 30 tablet; Refill: 0   No orders of the defined types were placed in this encounter.  No orders of the defined types were placed in this encounter.  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  I suspect this is a likely thoracic strain/spasm possibly related again to C-spine degenerative changes.  Proceed with  treatment as above.  Will await formal review by radiology for confirmation.  Carol Norlander, DO [place] [phone]

## 2018-11-04 ENCOUNTER — Telehealth: Payer: Self-pay | Admitting: Family Medicine

## 2018-11-04 NOTE — Telephone Encounter (Signed)
Aware of xray results 

## 2019-02-02 ENCOUNTER — Encounter: Payer: Self-pay | Admitting: Gastroenterology

## 2019-02-24 ENCOUNTER — Ambulatory Visit (INDEPENDENT_AMBULATORY_CARE_PROVIDER_SITE_OTHER): Payer: BC Managed Care – PPO | Admitting: Family Medicine

## 2019-02-24 ENCOUNTER — Encounter: Payer: Self-pay | Admitting: Family Medicine

## 2019-02-24 DIAGNOSIS — M5441 Lumbago with sciatica, right side: Secondary | ICD-10-CM

## 2019-02-24 MED ORDER — NAPROXEN 500 MG PO TABS: 500.0000 mg | ORAL_TABLET | Freq: Two times a day (BID) | ORAL | 0 refills | Status: AC

## 2019-02-24 MED ORDER — PREDNISONE 20 MG PO TABS: ORAL_TABLET | ORAL | 0 refills | Status: DC

## 2019-02-24 MED ORDER — TIZANIDINE HCL 4 MG PO TABS: 2.0000 mg | ORAL_TABLET | Freq: Four times a day (QID) | ORAL | 0 refills | Status: DC | PRN

## 2019-02-24 NOTE — Progress Notes (Signed)
Virtual Visit via telephone Note Due to COVID-19 pandemic this visit was conducted virtually. This visit type was conducted due to national recommendations for restrictions regarding the COVID-19 Pandemic (e.g. social distancing, sheltering in place) in an effort to limit this patient's exposure and mitigate transmission in our community. All issues noted in this document were discussed and addressed.  A physical exam was not performed with this format.   I connected with Carol [name] on 02/24/2019 at 1150 by telephone and verified that I am speaking with the correct person using two identifiers. Carol [name] is currently located at home and no one is currently with them during visit. The provider, Monia Pouch, FNP is located in their office at time of visit.  I discussed the limitations, risks, security and privacy concerns of performing an evaluation and management service by telephone and the availability of in person appointments. I also discussed with the patient that there may be a patient responsible charge related to this service. The patient expressed understanding and agreed to proceed.  Subjective:  Patient ID: Carol [name], female    DOB: [birth year], 59 y.o.   MRN: [id number]  Chief Complaint:  Back Pain   HPI: Carol [name] is a 59 y.o. female presenting on 02/24/2019 for Back Pain   Back Pain This is a recurrent problem. The current episode started 1 to 4 weeks ago. The problem occurs daily. The problem has been waxing and waning since onset. The pain is present in the lumbar spine, sacro-iliac and gluteal. The quality of the pain is described as aching, burning and shooting. The pain radiates to the right thigh. The pain is at a severity of 5/10. The pain is moderate. The pain is worse during the day. The symptoms are aggravated by bending, position, standing, stress and twisting. Stiffness is present in the morning. Associated symptoms include leg pain and tingling. Pertinent  negatives include no abdominal pain, bladder incontinence, bowel incontinence, chest pain, dysuria, fever, headaches, numbness, paresis, paresthesias, pelvic pain, perianal numbness, weakness or weight loss. She has tried analgesics, muscle relaxant and heat for the symptoms. The treatment provided mild relief.     Relevant past medical, surgical, family, and social history reviewed and updated as indicated.  Allergies and medications reviewed and updated.   Past Medical History:  Diagnosis Date  . Chronic nausea   . Diverticula of colon   . GERD (gastroesophageal reflux disease)   . Ovarian cyst   . Renal disorder    kidney stones    Past Surgical History:  Procedure Laterality Date  . APPENDECTOMY  2010  . BREAST LUMPECTOMY     papilloma per pathology  . CHOLECYSTECTOMY  1997  . COLONOSCOPY  2010   repeat in 5 years; family hx colon ca and polypectomy  . COLONOSCOPY N/A 03/09/2014   Dr. Gala Romney: tubular adenoma, pancolonic diverticulosis. Surveillance due in 2021.   . ESOPHAGOGASTRODUODENOSCOPY N/A 03/09/2014   Dr. Gala Romney: non-critical Schatzki's ring, not manipulated, mild erosive reflux esophagitis, gastric and duodenal erosions with negative H.pylori  . KIDNEY STONE SURGERY    . SHOULDER SURGERY    . TUBAL LIGATION      Social History   Socioeconomic History  . Marital status: Married    Spouse name: Not on file  . Number of children: 4  . Years of education: Not on file  . Highest education level: Not on file  Occupational History    Employer: [id number]  Tobacco Use  .  Smoking status: Current Every Day Smoker    Packs/day: 1.00    Years: 35.00    Pack years: 35.00    Types: Cigarettes  . Smokeless tobacco: Never Used  . Tobacco comment: Smokes about a pack a day  Substance and Sexual Activity  . Alcohol use: Yes    Alcohol/week: 0.0 standard drinks    Comment: socially/rarely  . Drug use: No  . Sexual activity: Not on file  Other Topics Concern  . Not on  file  Social History Narrative   Lives at home with husband   Social Determinants of Health   Financial Resource Strain:   . Difficulty of Paying Living Expenses: Not on file  Food Insecurity:   . Worried About Charity fundraiser in the Last Year: Not on file  . Ran Out of Food in the Last Year: Not on file  Transportation Needs:   . Lack of Transportation (Medical): Not on file  . Lack of Transportation (Non-Medical): Not on file  Physical Activity:   . Days of Exercise per Week: Not on file  . Minutes of Exercise per Session: Not on file  Stress:   . Feeling of Stress : Not on file  Social Connections:   . Frequency of Communication with Friends and Family: Not on file  . Frequency of Social Gatherings with Friends and Family: Not on file  . Attends Religious Services: Not on file  . Active Member of Clubs or Organizations: Not on file  . Attends Archivist Meetings: Not on file  . Marital Status: Not on file  Intimate Partner Violence:   . Fear of Current or Ex-Partner: Not on file  . Emotionally Abused: Not on file  . Physically Abused: Not on file  . Sexually Abused: Not on file    Outpatient Encounter Medications as of 02/24/2019  Medication Sig  . acetaminophen (TYLENOL) 500 MG tablet Take 1,000 mg by mouth every 6 (six) hours as needed for moderate pain.  . fluticasone (FLONASE) 50 MCG/ACT nasal spray Place 1-2 sprays into both nostrils daily as needed for allergies or rhinitis.  . naproxen (NAPROSYN) 500 MG tablet Take 1 tablet (500 mg total) by mouth 2 (two) times daily with a meal for 14 days.  Marland Kitchen omeprazole (PRILOSEC OTC) 20 MG tablet Take 20 mg by mouth daily.  . predniSONE (DELTASONE) 20 MG tablet 2 po at sametime daily for 5 days  . promethazine (PHENERGAN) 25 MG tablet Take 1 tablet (25 mg total) by mouth every 6 (six) hours as needed for nausea or vomiting. May take an additional 1/2 tablet if needed.  Marland Kitchen tiZANidine (ZANAFLEX) 4 MG tablet Take 0.5-1  tablets (2-4 mg total) by mouth every 6 (six) hours as needed for muscle spasms.  . [DISCONTINUED] predniSONE (STERAPRED UNI-PAK 21 TAB) 10 MG (21) TBPK tablet As directed x 6 days  . [DISCONTINUED] tiZANidine (ZANAFLEX) 4 MG tablet Take 0.5-1 tablets (2-4 mg total) by mouth every 6 (six) hours as needed for muscle spasms.   No facility-administered encounter medications on file as of 02/24/2019.    Allergies  Allergen Reactions  . Penicillins Shortness Of Breath and Swelling    Has patient had a PCN reaction causing immediate rash, facial/tongue/throat swelling, SOB or lightheadedness with hypotension: Yes Has patient had a PCN reaction causing severe rash involving mucus membranes or skin necrosis: No Has patient had a PCN reaction that required hospitalization No Has patient had a PCN reaction occurring within  the last 10 years: No If all of the above answers are "NO", then may proceed with Cephalosporin use.   . Sulfa Antibiotics     Nausea, itching, headache    Review of Systems  Constitutional: Negative for activity change, appetite change, chills, diaphoresis, fatigue, fever, unexpected weight change and weight loss.  HENT: Negative.   Eyes: Negative.   Respiratory: Negative for cough, chest tightness and shortness of breath.   Cardiovascular: Negative for chest pain, palpitations and leg swelling.  Gastrointestinal: Negative for abdominal pain, blood in stool, bowel incontinence, constipation, diarrhea, nausea and vomiting.  Endocrine: Negative.   Genitourinary: Negative for bladder incontinence, decreased urine volume, difficulty urinating, dysuria, flank pain, frequency, pelvic pain and urgency.  Musculoskeletal: Positive for arthralgias, back pain and myalgias. Negative for joint swelling, neck pain and neck stiffness.  Skin: Negative.  Negative for color change, pallor and rash.  Allergic/Immunologic: Negative.   Neurological: Positive for tingling. Negative for dizziness,  tremors, seizures, syncope, facial asymmetry, speech difficulty, weakness, light-headedness, numbness, headaches and paresthesias.  Hematological: Negative.   Psychiatric/Behavioral: Negative for confusion, hallucinations, sleep disturbance and suicidal ideas.  All other systems reviewed and are negative.        Observations/Objective: No vital signs or physical exam, this was a telephone or virtual health encounter.  Pt alert and oriented, answers all questions appropriately, and able to speak in full sentences.    Assessment and Plan: Shavawn was seen today for back pain.  Diagnoses and all orders for this visit:  Acute right-sided low back pain with right-sided sciatica Reported symptoms consistent with acute lower back pain with right sided sciatica. No red flags concerning for cauda equina syndrome. Pt aware of these symptoms and to go to the ED if they occur. Will treat with below. Symptomatic care discussed in detail. Follow up in 4-6 weeks or sooner if needed.  -     predniSONE (DELTASONE) 20 MG tablet; 2 po at sametime daily for 5 days -     naproxen (NAPROSYN) 500 MG tablet; Take 1 tablet (500 mg total) by mouth 2 (two) times daily with a meal for 14 days. -     tiZANidine (ZANAFLEX) 4 MG tablet; Take 0.5-1 tablets (2-4 mg total) by mouth every 6 (six) hours as needed for muscle spasms.     Follow Up Instructions: Return in about 4 weeks (around 03/24/2019), or if symptoms worsen or fail to improve, for back pain.    I discussed the assessment and treatment plan with the patient. The patient was provided an opportunity to ask questions and all were answered. The patient agreed with the plan and demonstrated an understanding of the instructions.   The patient was advised to call back or seek an in-person evaluation if the symptoms worsen or if the condition fails to improve as anticipated.  The above assessment and management plan was discussed with the patient. The patient  verbalized understanding of and has agreed to the management plan. Patient is aware to call the clinic if they develop any new symptoms or if symptoms persist or worsen. Patient is aware when to return to the clinic for a follow-up visit. Patient educated on when it is appropriate to go to the emergency department.    I provided 15 minutes of non-face-to-face time during this encounter. The call started at 1150. The call ended at 1205. The other time was used for coordination of care.    Monia Pouch, FNP-C [facility] [phone]  [address] [place], [place] [address] [phone] 02/24/2019

## 2019-03-02 ENCOUNTER — Encounter: Payer: Self-pay | Admitting: Family Medicine

## 2019-03-02 ENCOUNTER — Telehealth: Payer: Self-pay | Admitting: Family Medicine

## 2019-03-02 ENCOUNTER — Ambulatory Visit (INDEPENDENT_AMBULATORY_CARE_PROVIDER_SITE_OTHER): Payer: BC Managed Care – PPO | Admitting: Family Medicine

## 2019-03-02 DIAGNOSIS — R3989 Other symptoms and signs involving the genitourinary system: Secondary | ICD-10-CM | POA: Diagnosis not present

## 2019-03-02 DIAGNOSIS — R197 Diarrhea, unspecified: Secondary | ICD-10-CM | POA: Diagnosis not present

## 2019-03-02 MED ORDER — CIPROFLOXACIN HCL 500 MG PO TABS: 500.0000 mg | ORAL_TABLET | Freq: Two times a day (BID) | ORAL | 0 refills | Status: AC

## 2019-03-02 NOTE — Patient Instructions (Signed)
Urinary Tract Infection, Adult A urinary tract infection (UTI) is an infection of any part of the urinary tract. The urinary tract includes:  The kidneys.  The ureters.  The bladder.  The urethra. These organs make, store, and get rid of pee (urine) in the body. What are the causes? This is caused by germs (bacteria) in your genital area. These germs grow and cause swelling (inflammation) of your urinary tract. What increases the risk? You are more likely to develop this condition if:  You have a small, thin tube (catheter) to drain pee.  You cannot control when you pee or poop (incontinence).  You are female, and: ? You use these methods to prevent pregnancy:  A medicine that kills sperm (spermicide).  A device that blocks sperm (diaphragm). ? You have low levels of a female hormone (estrogen). ? You are pregnant.  You have genes that add to your risk.  You are sexually active.  You take antibiotic medicines.  You have trouble peeing because of: ? A prostate that is bigger than normal, if you are female. ? A blockage in the part of your body that drains pee from the bladder (urethra). ? A kidney stone. ? A nerve condition that affects your bladder (neurogenic bladder). ? Not getting enough to drink. ? Not peeing often enough.  You have other conditions, such as: ? Diabetes. ? A weak disease-fighting system (immune system). ? Sickle cell disease. ? Gout. ? Injury of the spine. What are the signs or symptoms? Symptoms of this condition include:  Needing to pee right away (urgently).  Peeing often.  Peeing small amounts often.  Pain or burning when peeing.  Blood in the pee.  Pee that smells bad or not like normal.  Trouble peeing.  Pee that is cloudy.  Fluid coming from the vagina, if you are female.  Pain in the belly or lower back. Other symptoms include:  Throwing up (vomiting).  No urge to eat.  Feeling mixed up (confused).  Being tired  and grouchy (irritable).  A fever.  Watery poop (diarrhea). How is this treated? This condition may be treated with:  Antibiotic medicine.  Other medicines.  Drinking enough water. Follow these instructions at home:  Medicines  Take over-the-counter and prescription medicines only as told by your doctor.  If you were prescribed an antibiotic medicine, take it as told by your doctor. Do not stop taking it even if you start to feel better. General instructions  Make sure you: ? Pee until your bladder is empty. ? Do not hold pee for a long time. ? Empty your bladder after sex. ? Wipe from front to back after pooping if you are a female. Use each tissue one time when you wipe.  Drink enough fluid to keep your pee pale yellow.  Keep all follow-up visits as told by your doctor. This is important. Contact a doctor if:  You do not get better after 1-2 days.  Your symptoms go away and then come back. Get help right away if:  You have very bad back pain.  You have very bad pain in your lower belly.  You have a fever.  You are sick to your stomach (nauseous).  You are throwing up. Summary  A urinary tract infection (UTI) is an infection of any part of the urinary tract.  This condition is caused by germs in your genital area.  There are many risk factors for a UTI. These include having a small, thin   tube to drain pee and not being able to control when you pee or poop.  Treatment includes antibiotic medicines for germs.  Drink enough fluid to keep your pee pale yellow. This information is not intended to replace advice given to you by your health care provider. Make sure you discuss any questions you have with your health care provider. Document Revised: 01/21/2018 Document Reviewed: 08/13/2017 Elsevier Patient Education  2020 [employer] Inc[employer]  

## 2019-03-02 NOTE — Progress Notes (Signed)
Virtual Visit via Telephone Note  I connected with Carol [name] on 03/02/19 at 1:48 PM by telephone and verified that I am speaking with the correct person using two identifiers. Carol [name] is currently located at home and nobody is currently with her during this visit. The provider, Loman Brooklyn, FNP is located in their home at time of visit.  I discussed the limitations, risks, security and privacy concerns of performing an evaluation and management service by telephone and the availability of in person appointments. I also discussed with the patient that there may be a patient responsible charge related to this service. The patient expressed understanding and agreed to proceed.  Subjective: PCP: Claretta Fraise, MD  Chief Complaint  Patient presents with  . Diarrhea  . Urinary odor  . Hypertension   Patient has multiple complaints today including diarrhea, urinary odor, and elevated blood pressure.  She reports her blood pressure was not elevated until after she was started on prednisone a week ago.  Max BP 146/79.  Patient complains of diarrhea, dysuria, foul smelling urine, frequency, hesitancy, nausea, pain in the lower abdomen and overall not feeling well/weakness.  Patient also complains of back pain. She has had symptoms for over a week (prior to the onset of low back pain), with the exception of the diarrhea which started today. She describes it as watery and states she has gone several times today. Patient denies blood in stool, illness in household contacts, recent antibiotic use, and fever. Patient does have a history of previous UTIs but states it has been a long time.  Patient does not have a history of pyelonephritis. Patient did miss work on the 7th and again today due to her symptoms. She is supposed to return to work tomorrow at [employer].    ROS: Per HPI  Current Outpatient Medications:  .  acetaminophen (TYLENOL) 500 MG tablet, Take 1,000 mg by mouth every 6 (six)  hours as needed for moderate pain., Disp: , Rfl:  .  fluticasone (FLONASE) 50 MCG/ACT nasal spray, Place 1-2 sprays into both nostrils daily as needed for allergies or rhinitis., Disp: , Rfl:  .  naproxen (NAPROSYN) 500 MG tablet, Take 1 tablet (500 mg total) by mouth 2 (two) times daily with a meal for 14 days., Disp: 28 tablet, Rfl: 0 .  omeprazole (PRILOSEC OTC) 20 MG tablet, Take 20 mg by mouth daily., Disp: , Rfl:  .  promethazine (PHENERGAN) 25 MG tablet, Take 1 tablet (25 mg total) by mouth every 6 (six) hours as needed for nausea or vomiting. May take an additional 1/2 tablet if needed., Disp: 30 tablet, Rfl: 0 .  tiZANidine (ZANAFLEX) 4 MG tablet, Take 0.5-1 tablets (2-4 mg total) by mouth every 6 (six) hours as needed for muscle spasms., Disp: 10 tablet, Rfl: 0  Allergies  Allergen Reactions  . Penicillins Shortness Of Breath and Swelling    Has patient had a PCN reaction causing immediate rash, facial/tongue/throat swelling, SOB or lightheadedness with hypotension: Yes Has patient had a PCN reaction causing severe rash involving mucus membranes or skin necrosis: No Has patient had a PCN reaction that required hospitalization No Has patient had a PCN reaction occurring within the last 10 years: No If all of the above answers are "NO", then may proceed with Cephalosporin use.   . Sulfa Antibiotics     Nausea, itching, headache   Past Medical History:  Diagnosis Date  . Chronic nausea   . Diverticula of  colon   . GERD (gastroesophageal reflux disease)   . Ovarian cyst   . Renal disorder    kidney stones    Observations/Objective: A&O  No respiratory distress or wheezing audible over the phone Mood, judgement, and thought processes all WNL  Assessment and Plan: 1. Suspected UTI - Treating for UTI. Encouraged adequate hydration. Education provided on UTI.  - ciprofloxacin (CIPRO) 500 MG tablet; Take 1 tablet (500 mg total) by mouth 2 (two) times daily for 10 days.   Dispense: 20 tablet; Refill: 0  2. Diarrhea, unspecified type - Encouraged patient to get tested for COVID-19 and not to return to work while having diarrhea. Education provided on scheduling an appointment for testing.    Follow Up Instructions:  I discussed the assessment and treatment plan with the patient. The patient was provided an opportunity to ask questions and all were answered. The patient agreed with the plan and demonstrated an understanding of the instructions.   The patient was advised to call back or seek an in-person evaluation if the symptoms worsen or if the condition fails to improve as anticipated.  The above assessment and management plan was discussed with the patient. The patient verbalized understanding of and has agreed to the management plan. Patient is aware to call the clinic if symptoms persist or worsen. Patient is aware when to return to the clinic for a follow-up visit. Patient educated on when it is appropriate to go to the emergency department.   Time call ended: 1:56 PM  I provided 20 minutes of non-face-to-face time during this encounter.  Hendricks Limes, MSN, APRN, FNP-C [facility] 03/02/19

## 2019-03-03 ENCOUNTER — Ambulatory Visit: Payer: BLUE CROSS/BLUE SHIELD | Attending: Internal Medicine

## 2019-03-03 ENCOUNTER — Other Ambulatory Visit: Payer: Self-pay

## 2019-03-03 DIAGNOSIS — Z20822 Contact with and (suspected) exposure to covid-19: Secondary | ICD-10-CM

## 2019-03-04 LAB — NOVEL CORONAVIRUS, NAA: SARS-CoV-2, NAA: NOT DETECTED

## 2019-04-14 ENCOUNTER — Ambulatory Visit: Payer: BC Managed Care – PPO | Attending: Internal Medicine

## 2019-04-14 ENCOUNTER — Other Ambulatory Visit: Payer: Self-pay

## 2019-04-14 DIAGNOSIS — Z20822 Contact with and (suspected) exposure to covid-19: Secondary | ICD-10-CM

## 2019-04-15 LAB — NOVEL CORONAVIRUS, NAA: SARS-CoV-2, NAA: NOT DETECTED

## 2019-05-03 ENCOUNTER — Ambulatory Visit: Payer: BC Managed Care – PPO | Admitting: Orthopaedic Surgery

## 2019-05-04 ENCOUNTER — Encounter: Payer: Self-pay | Admitting: Orthopaedic Surgery

## 2019-05-04 ENCOUNTER — Ambulatory Visit (INDEPENDENT_AMBULATORY_CARE_PROVIDER_SITE_OTHER): Payer: BC Managed Care – PPO | Admitting: Orthopaedic Surgery

## 2019-05-04 ENCOUNTER — Other Ambulatory Visit: Payer: Self-pay

## 2019-05-04 ENCOUNTER — Ambulatory Visit: Payer: Self-pay

## 2019-05-04 VITALS — BP 139/92 | HR 61 | Ht 65.0 in | Wt 152.1 lb

## 2019-05-04 DIAGNOSIS — G8929 Other chronic pain: Secondary | ICD-10-CM

## 2019-05-04 DIAGNOSIS — M25511 Pain in right shoulder: Secondary | ICD-10-CM | POA: Diagnosis not present

## 2019-05-04 DIAGNOSIS — F17210 Nicotine dependence, cigarettes, uncomplicated: Secondary | ICD-10-CM | POA: Diagnosis not present

## 2019-05-04 MED ORDER — HYDROCODONE-ACETAMINOPHEN 5-325 MG PO TABS: ORAL_TABLET | ORAL | 0 refills | Status: DC

## 2019-05-04 MED ORDER — NAPROXEN 500 MG PO TABS: 500.0000 mg | ORAL_TABLET | Freq: Two times a day (BID) | ORAL | 5 refills | Status: DC

## 2019-05-04 NOTE — Patient Instructions (Addendum)

## 2019-05-04 NOTE — Progress Notes (Signed)
Subjective:    Patient ID: Carol [name], female    DOB: 04/29/1960, 59 y.o.   MRN: [id number]  HPI She has history of right shoulder problems.  She had surgery by Dr. Lorin Mercy in 2016.  I have reviewed his notes and operative note.  Surgery done 11-06-2014 for manipulation under general anesthesia, dbridement of adhesions, dbridement of SLAP tear, biceps release and open biceps tenodesis with arthroscopy.  She has done well until a few weeks ago when she was lifting objects at [facility] and had pain in the right shoulder.  She still has pain and has limited motion.  She has tried ice and heat.  She has taken Tylenol. She has only minimal help.   Review of Systems  Constitutional: Positive for activity change.  Musculoskeletal: Positive for arthralgias.  All other systems reviewed and are negative.  For Review of Systems, all other systems reviewed and are negative.  The following is a summary of the past history medically, past history surgically, known current medicines, social history and family history.  This information is gathered electronically by the computer from prior information and documentation.  I review this each visit and have found including this information at this point in the chart is beneficial and informative.   Past Medical History:  Diagnosis Date  . Chronic nausea   . Diverticula of colon   . GERD (gastroesophageal reflux disease)   . Ovarian cyst   . Renal disorder    kidney stones    Past Surgical History:  Procedure Laterality Date  . APPENDECTOMY  2010  . BREAST LUMPECTOMY     papilloma per pathology  . CHOLECYSTECTOMY  1997  . COLONOSCOPY  2010   repeat in 5 years; family hx colon ca and polypectomy  . COLONOSCOPY N/A 03/09/2014   Dr. Gala Romney: tubular adenoma, pancolonic diverticulosis. Surveillance due in 2021.   . ESOPHAGOGASTRODUODENOSCOPY N/A 03/09/2014   Dr. Gala Romney: non-critical Schatzki's ring, not manipulated, mild erosive reflux esophagitis, gastric  and duodenal erosions with negative H.pylori  . KIDNEY STONE SURGERY    . SHOULDER SURGERY    . TUBAL LIGATION      Current Outpatient Medications on File Prior to Visit  Medication Sig Dispense Refill  . acetaminophen (TYLENOL) 500 MG tablet Take 1,000 mg by mouth every 6 (six) hours as needed for moderate pain.    . fluticasone (FLONASE) 50 MCG/ACT nasal spray Place 1-2 sprays into both nostrils daily as needed for allergies or rhinitis.    Marland Kitchen omeprazole (PRILOSEC OTC) 20 MG tablet Take 20 mg by mouth daily.    . promethazine (PHENERGAN) 25 MG tablet Take 1 tablet (25 mg total) by mouth every 6 (six) hours as needed for nausea or vomiting. May take an additional 1/2 tablet if needed. 30 tablet 0  . tiZANidine (ZANAFLEX) 4 MG tablet Take 0.5-1 tablets (2-4 mg total) by mouth every 6 (six) hours as needed for muscle spasms. 10 tablet 0   No current facility-administered medications on file prior to visit.    Social History   Socioeconomic History  . Marital status: Married    Spouse name: Not on file  . Number of children: 4  . Years of education: Not on file  . Highest education level: Not on file  Occupational History    Employer: [id number]  Tobacco Use  . Smoking status: Current Every Day Smoker    Packs/day: 1.00    Years: 35.00    Pack years: 35.00  Types: Cigarettes  . Smokeless tobacco: Never Used  . Tobacco comment: Smokes about a pack a day  Substance and Sexual Activity  . Alcohol use: Yes    Alcohol/week: 0.0 standard drinks    Comment: socially/rarely  . Drug use: No  . Sexual activity: Not on file  Other Topics Concern  . Not on file  Social History Narrative   Lives at home with husband   Social Determinants of Health   Financial Resource Strain:   . Difficulty of Paying Living Expenses:   Food Insecurity:   . Worried About Charity fundraiser in the Last Year:   . Arboriculturist in the Last Year:   Transportation Needs:   . Film/video editor  (Medical):   Marland Kitchen Lack of Transportation (Non-Medical):   Physical Activity:   . Days of Exercise per Week:   . Minutes of Exercise per Session:   Stress:   . Feeling of Stress :   Social Connections:   . Frequency of Communication with Friends and Family:   . Frequency of Social Gatherings with Friends and Family:   . Attends Religious Services:   . Active Member of Clubs or Organizations:   . Attends Archivist Meetings:   Marland Kitchen Marital Status:   Intimate Partner Violence:   . Fear of Current or Ex-Partner:   . Emotionally Abused:   Marland Kitchen Physically Abused:   . Sexually Abused:     Family History  Problem Relation Age of Onset  . Heart disease Father        Pacemaker  . CAD Mother 22       CABG  . Cancer Mother        Died age 24 of colon CA  . Atrial fibrillation Sister     BP (!) 139/92   Pulse 61   Ht 5\' 5"  (1.651 m)   Wt 152 lb 2 oz (69 kg)   BMI 25.31 kg/m   Body mass index is 25.31 kg/m.     Objective:   Physical Exam Vitals and nursing note reviewed.  Constitutional:      Appearance: She is well-developed.  HENT:     Head: Normocephalic and atraumatic.  Eyes:     Conjunctiva/sclera: Conjunctivae normal.     Pupils: Pupils are equal, round, and reactive to light.  Cardiovascular:     Rate and Rhythm: Normal rate and regular rhythm.  Pulmonary:     Effort: Pulmonary effort is normal.  Abdominal:     Palpations: Abdomen is soft.  Musculoskeletal:       Arms:     Cervical back: Normal range of motion and neck supple.  Skin:    General: Skin is warm and dry.  Neurological:     Mental Status: She is alert and oriented to person, place, and time.     Cranial Nerves: No cranial nerve deficit.     Motor: No abnormal muscle tone.     Coordination: Coordination normal.     Deep Tendon Reflexes: Reflexes are normal and symmetric. Reflexes normal.  Psychiatric:        Behavior: Behavior normal.        Thought Content: Thought content normal.         Judgment: Judgment normal.       X-rays were done of the right shoulder, reported separately.  Negative.    Assessment & Plan:   Encounter Diagnoses  Name Primary?  . Chronic right  shoulder pain Yes  . Nicotine dependence, cigarettes, uncomplicated    I will keep her off work.  I will begin Naprosyn and Norco.  I offered injection and she declined.  I will see her in two weeks.  She may need MRI.  I have reviewed the [place] web site prior to prescribing narcotic medicine for this patient.   Call if any problem.  Precautions discussed.   Electronically Signed Sanjuana Kava, MD 3/17/202111:08 AM

## 2019-05-11 ENCOUNTER — Encounter: Payer: Self-pay | Admitting: Orthopaedic Surgery

## 2019-05-11 ENCOUNTER — Telehealth: Payer: Self-pay | Admitting: Orthopaedic Surgery

## 2019-05-11 NOTE — Telephone Encounter (Signed)
Regarding patient's forms for short-term disability - had been made aware of [employer] form process, and had come to office yesterday, 05/10/19 to fill out authorization forms. Called back to patient regarding her out of work note; reached voice mail, left message.

## 2019-05-11 NOTE — Telephone Encounter (Signed)
Spoke w/patient; work note issued.

## 2019-05-17 ENCOUNTER — Other Ambulatory Visit: Payer: Self-pay | Admitting: Orthopaedic Surgery

## 2019-05-18 ENCOUNTER — Other Ambulatory Visit: Payer: Self-pay

## 2019-05-18 ENCOUNTER — Encounter: Payer: Self-pay | Admitting: Orthopaedic Surgery

## 2019-05-18 ENCOUNTER — Ambulatory Visit (INDEPENDENT_AMBULATORY_CARE_PROVIDER_SITE_OTHER): Payer: BC Managed Care – PPO | Admitting: Orthopaedic Surgery

## 2019-05-18 VITALS — BP 102/65 | HR 63 | Ht 64.0 in | Wt 152.0 lb

## 2019-05-18 DIAGNOSIS — F17210 Nicotine dependence, cigarettes, uncomplicated: Secondary | ICD-10-CM | POA: Diagnosis not present

## 2019-05-18 DIAGNOSIS — M25511 Pain in right shoulder: Secondary | ICD-10-CM | POA: Diagnosis not present

## 2019-05-18 DIAGNOSIS — G8929 Other chronic pain: Secondary | ICD-10-CM

## 2019-05-18 MED ORDER — HYDROCODONE-ACETAMINOPHEN 5-325 MG PO TABS: ORAL_TABLET | ORAL | 0 refills | Status: DC

## 2019-05-18 NOTE — Progress Notes (Signed)
Patient [id number] [name], female DOB:Dec 08, 1960, 59 y.o. [id number]  Chief Complaint  Patient presents with  . Shoulder Pain    R/achey/hurting but not as much when taking meds    HPI  Carol [name] is a 59 y.o. female who has continued pain of the right shoulder.  She had surgery by Dr. Lorin Mercy in 2016 to the shoulder.  She has had continued pain over the last several months.  She has done the exercises she learned in PT in the past and they have not helped.  She has pain with overhead use.  She has no new trauma, no redness, no swelling.  She has no numbness.  I would like to get a MRI of the shoulder to make sure she has no new tear or has excessive scar tissue.  The patient is agreeable to this.  I will renew her pain medicine.   Body mass index is 26.09 kg/m.  ROS  Review of Systems  All other systems reviewed and are negative.  The following is a summary of the past history medically, past history surgically, known current medicines, social history and family history.  This information is gathered electronically by the computer from prior information and documentation.  I review this each visit and have found including this information at this point in the chart is beneficial and informative.    Past Medical History:  Diagnosis Date  . Chronic nausea   . Diverticula of colon   . GERD (gastroesophageal reflux disease)   . Ovarian cyst   . Renal disorder    kidney stones    Past Surgical History:  Procedure Laterality Date  . APPENDECTOMY  2010  . BREAST LUMPECTOMY     papilloma per pathology  . CHOLECYSTECTOMY  1997  . COLONOSCOPY  2010   repeat in 5 years; family hx colon ca and polypectomy  . COLONOSCOPY N/A 03/09/2014   Dr. Gala Romney: tubular adenoma, pancolonic diverticulosis. Surveillance due in 2021.   . ESOPHAGOGASTRODUODENOSCOPY N/A 03/09/2014   Dr. Gala Romney: non-critical Schatzki's ring, not manipulated, mild erosive reflux esophagitis, gastric and duodenal  erosions with negative H.pylori  . KIDNEY STONE SURGERY    . SHOULDER SURGERY    . TUBAL LIGATION      Family History  Problem Relation Age of Onset  . Heart disease Father        Pacemaker  . CAD Mother 79       CABG  . Cancer Mother        Died age 30 of colon CA  . Atrial fibrillation Sister     Social History Social History   Tobacco Use  . Smoking status: Current Every Day Smoker    Packs/day: 1.00    Years: 35.00    Pack years: 35.00    Types: Cigarettes  . Smokeless tobacco: Never Used  . Tobacco comment: Smokes about a pack a day  Substance Use Topics  . Alcohol use: Yes    Alcohol/week: 0.0 standard drinks    Comment: socially/rarely  . Drug use: No    Allergies  Allergen Reactions  . Penicillins Shortness Of Breath and Swelling    Has patient had a PCN reaction causing immediate rash, facial/tongue/throat swelling, SOB or lightheadedness with hypotension: Yes Has patient had a PCN reaction causing severe rash involving mucus membranes or skin necrosis: No Has patient had a PCN reaction that required hospitalization No Has patient had a PCN reaction occurring within the last 10 years: No  If all of the above answers are "NO", then may proceed with Cephalosporin use.   . Sulfa Antibiotics     Nausea, itching, headache    Current Outpatient Medications  Medication Sig Dispense Refill  . acetaminophen (TYLENOL) 500 MG tablet Take 1,000 mg by mouth every 6 (six) hours as needed for moderate pain.    . fluticasone (FLONASE) 50 MCG/ACT nasal spray Place 1-2 sprays into both nostrils daily as needed for allergies or rhinitis.    Marland Kitchen HYDROcodone-acetaminophen (NORCO/VICODIN) 5-325 MG tablet One tablet every six hours for pain. 20 tablet 0  . naproxen (NAPROSYN) 500 MG tablet Take 1 tablet (500 mg total) by mouth 2 (two) times daily with a meal. 60 tablet 5  . omeprazole (PRILOSEC OTC) 20 MG tablet Take 20 mg by mouth daily.    . promethazine (PHENERGAN) 25 MG  tablet Take 1 tablet (25 mg total) by mouth every 6 (six) hours as needed for nausea or vomiting. May take an additional 1/2 tablet if needed. 30 tablet 0  . tiZANidine (ZANAFLEX) 4 MG tablet Take 0.5-1 tablets (2-4 mg total) by mouth every 6 (six) hours as needed for muscle spasms. 10 tablet 0   No current facility-administered medications for this visit.     Physical Exam  Blood pressure 102/65, pulse 63, height 5\' 4"  (1.626 m), weight 152 lb (68.9 kg).  Constitutional: overall normal hygiene, normal nutrition, well developed, normal grooming, normal body habitus. Assistive device:none  Musculoskeletal: gait and station Limp none, muscle tone and strength are normal, no tremors or atrophy is present.  .  Neurological: coordination overall normal.  Deep tendon reflex/nerve stretch intact.  Sensation normal.  Cranial nerves II-XII intact.   Skin:   Normal overall no scars, lesions, ulcers or rashes. No psoriasis.  Psychiatric: Alert and oriented x 3.  Recent memory intact, remote memory unclear.  Normal mood and affect. Well groomed.  Good eye contact.  Cardiovascular: overall no swelling, no varicosities, no edema bilaterally, normal temperatures of the legs and arms, no clubbing, cyanosis and good capillary refill.  Lymphatic: palpation is normal.  All other systems reviewed and are negative  Examination of right Upper Extremity is done.  Inspection:   Overall:  Elbow non-tender without crepitus or defects, forearm non-tender without crepitus or defects, wrist non-tender without crepitus or defects, hand non-tender.    Shoulder: with glenohumeral joint tenderness, without effusion.   Upper arm:  without swelling and tenderness   Range of motion:   Overall:  Full range of motion of the elbow, full range of motion of wrist and full range of motion in fingers.   Shoulder:  right  145 degrees forward flexion; 120 degrees abduction; 30 degrees internal rotation, 30 degrees external  rotation, 10 degrees extension, 40 degrees adduction.   Stability:   Overall:  Shoulder, elbow and wrist stable   Strength and Tone:   Overall full shoulder muscles strength, full upper arm strength and normal upper arm bulk and tone.  The patient has been educated about the nature of the problem(s) and counseled on treatment options.  The patient appeared to understand what I have discussed and is in agreement with it.  Encounter Diagnoses  Name Primary?  . Chronic right shoulder pain Yes  . Nicotine dependence, cigarettes, uncomplicated     PLAN Call if any problems.  Precautions discussed.  Continue current medications.   Return to clinic 1 month   Get MRI of the right shoulder.  Electronically Signed  Sanjuana Kava, MD 3/31/20219:58 AM

## 2019-05-18 NOTE — Patient Instructions (Signed)

## 2019-05-19 ENCOUNTER — Encounter: Payer: Self-pay | Admitting: Orthopaedic Surgery

## 2019-05-22 ENCOUNTER — Encounter: Payer: Self-pay | Admitting: Orthopaedic Surgery

## 2019-05-23 ENCOUNTER — Telehealth: Payer: Self-pay | Admitting: Radiology

## 2019-05-23 ENCOUNTER — Encounter: Payer: Self-pay | Admitting: Orthopaedic Surgery

## 2019-05-23 NOTE — Telephone Encounter (Signed)
Patient called, left message.  She says that [facility] needs more info. She has requested an extension on her leave due to waiting for MRI scan.  Requests a phone call about this.  Can you please call her?  Thanks.

## 2019-05-23 NOTE — Telephone Encounter (Signed)
Called patient; discussed; most recent work note being sent to [employer]; signed authorization on file.

## 2019-05-24 NOTE — Telephone Encounter (Signed)
She needs note for work to be out.  See her message.

## 2019-05-31 ENCOUNTER — Other Ambulatory Visit: Payer: Self-pay

## 2019-05-31 ENCOUNTER — Ambulatory Visit (INDEPENDENT_AMBULATORY_CARE_PROVIDER_SITE_OTHER): Payer: Self-pay | Admitting: *Deleted

## 2019-05-31 DIAGNOSIS — Z86010 Personal history of colonic polyps: Secondary | ICD-10-CM

## 2019-05-31 MED ORDER — PEG 3350-KCL-NA BICARB-NACL 420 G PO SOLR: 4000.0000 mL | Freq: Once | ORAL | 0 refills | Status: AC

## 2019-05-31 NOTE — Progress Notes (Signed)
Is she still taking narcotics or was that for a brief course?

## 2019-05-31 NOTE — Patient Instructions (Signed)
Carol [name]   Aug 22, 1960 MRN: [id number]    Procedure Date: 07/15/2019 Time to register: 7:30 am Place to register: [facility] Procedure Time: 8:30 am Scheduled provider: Dr. Gala Romney  PREPARATION FOR COLONOSCOPY WITH TRI-LYTE SPLIT PREP  Please notify us immediately if you are diabetic, take iron supplements, or if you are on Coumadin or any other blood thinners.   Please hold the following medications: n/a   You will need to purchase 1 fleet enema and 1 box of Bisacodyl '5mg'$  tablets.   2 DAYS BEFORE PROCEDURE:  DATE: 07/13/2019   DAY: Wednesday Begin clear liquid diet AFTER your lunch meal. NO SOLID FOODS after this point.  1 DAY BEFORE PROCEDURE:  DATE: 07/14/2019   DAY: Thursday Continue clear liquids the entire day - NO SOLID FOOD.   Diabetic medications adjustments for today: n/a  At 2:00 pm:  Take 2 Bisacodyl tablets.   At 4:00pm:  Start drinking your solution. Make sure you mix well per instructions on the bottle. Try to drink 1 (one) 8 ounce glass every 10-15 minutes until you have consumed HALF the jug. You should complete by 6:00pm.You must keep the left over solution refrigerated until completed next day.  Continue clear liquids. You must drink plenty of clear liquids to prevent dehyration and kidney failure.     DAY OF PROCEDURE:   DATE: 07/15/2019  DAY: Friday If you take medications for your heart, blood pressure or breathing, you may take these medications.  Diabetic medications adjustments for today: n/a  Five hours before your procedure time @ 3:30 am:  Finish remaining amout of bowel prep, drinking 1 (one) 8 ounce glass every 10-15 minutes until complete. You have two hours to consume remaining prep.   Three hours before your procedure time @ 5:30 am:  Nothing by mouth.   At least one hour before going to the hospital:  Give yourself one Fleet enema. You may take your morning medications with sip of water unless we have instructed otherwise.       Please see below for Dietary Information.  CLEAR LIQUIDS INCLUDE:  Water Jello (NOT red in color)   Ice Popsicles (NOT red in color)   Tea (sugar ok, no milk/cream) Powdered fruit flavored drinks  Coffee (sugar ok, no milk/cream) Gatorade/ Lemonade/ Kool-Aid  (NOT red in color)   Juice: apple, white grape, white cranberry Soft drinks  Clear bullion, consomme, broth (fat free beef/chicken/vegetable)  Carbonated beverages (any kind)  Strained chicken noodle soup Hard Candy   Remember: Clear liquids are liquids that will allow you to see your fingers on the other side of a clear glass. Be sure liquids are NOT red in color, and not cloudy, but CLEAR.  DO NOT EAT OR DRINK ANY OF THE FOLLOWING:  Dairy products of any kind   Cranberry juice Tomato juice / V8 juice   Grapefruit juice Orange juice     Red grape juice  Do not eat any solid foods, including such foods as: cereal, oatmeal, yogurt, fruits, vegetables, creamed soups, eggs, bread, crackers, pureed foods in a blender, etc.   HELPFUL HINTS FOR DRINKING PREP SOLUTION:   Make sure prep is extremely cold. Mix and refrigerate the the morning of the prep. You may also put in the freezer.   You may try mixing some Crystal Light or Country Time Lemonade if you prefer. Mix in small amounts; add more if necessary.  Try drinking through a straw  Rinse mouth with water  or a mouthwash between glasses, to remove after-taste.  Try sipping on a cold beverage /ice/ popsicles between glasses of prep.  Place a piece of sugar-free hard candy in mouth between glasses.  If you become nauseated, try consuming smaller amounts, or stretch out the time between glasses. Stop for 30-60 minutes, then slowly start back drinking.        OTHER INSTRUCTIONS  You will need a responsible adult at least 57 years of age to accompany you and drive you home. This person must remain in the waiting room during your procedure. The hospital will cancel  your procedure if you do not have a responsible adult with you.   1. Wear loose fitting clothing that is easily removed. 2. Leave jewelry and other valuables at home.  3. Remove all body piercing jewelry and leave at home. 4. Total time from sign-in until discharge is approximately 2-3 hours. 5. You should go home directly after your procedure and rest. You can resume normal activities the day after your procedure. 6. The day of your procedure you should not:  Drive  Make legal decisions  Operate machinery  Drink alcohol  Return to work   You may call the office (Dept: [phone]) before 5:00pm, or page the doctor on call [phone]) after 5:00pm, for further instructions, if necessary.   Insurance Information YOU WILL NEED TO CHECK WITH YOUR INSURANCE COMPANY FOR THE BENEFITS OF COVERAGE YOU HAVE FOR THIS PROCEDURE.  UNFORTUNATELY, NOT ALL INSURANCE COMPANIES HAVE BENEFITS TO COVER ALL OR PART OF THESE TYPES OF PROCEDURES.  IT IS YOUR RESPONSIBILITY TO CHECK YOUR BENEFITS, HOWEVER, WE WILL BE GLAD TO ASSIST YOU WITH ANY CODES YOUR INSURANCE COMPANY MAY NEED.    PLEASE NOTE THAT MOST INSURANCE COMPANIES WILL NOT COVER A SCREENING COLONOSCOPY FOR PEOPLE UNDER THE AGE OF 50  IF YOU HAVE BCBS INSURANCE, YOU MAY HAVE BENEFITS FOR A SCREENING COLONOSCOPY BUT IF POLYPS ARE FOUND THE DIAGNOSIS WILL CHANGE AND THEN YOU MAY HAVE A DEDUCTIBLE THAT WILL NEED TO BE MET. SO PLEASE MAKE SURE YOU CHECK YOUR BENEFITS FOR A SCREENING COLONOSCOPY AS WELL AS A DIAGNOSTIC COLONOSCOPY.

## 2019-05-31 NOTE — Progress Notes (Signed)
Gastroenterology Pre-Procedure Review  Request Date: 05/31/2019 Requesting Physician: 5 year recall, Last TCS done 03/09/2014 by Dr. Gala Romney, fragments of tubular adenoma, positive family hx of colon cancer  PATIENT REVIEW QUESTIONS: The patient responded to the following health history questions as indicated:    1. Diabetes Melitis: no 2. Joint replacements in the past 12 months: no 3. Major health problems in the past 3 months: no 4. Has an artificial valve or MVP: no 5. Has a defibrillator: no 6. Has been advised in past to take antibiotics in advance of a procedure like teeth cleaning: no 7. Family history of colon cancer:  yes, mom: age 42 8. Alcohol Use: no 9. Illicit drug Use: no 10. History of sleep apnea: no 11. History of coronary artery or other vascular stents placed within the last 12 months: no 12. History of any prior anesthesia complications: no 13. There is no height or weight on file to calculate BMI. ht: 5'5  wt: 151 lbs    MEDICATIONS & ALLERGIES:    Patient reports the following regarding taking any blood thinners:   Plavix? no Aspirin? no Coumadin? no Brilinta? no Xarelto? no Eliquis? no Pradaxa? no Savaysa? no Effient? no  Patient confirms/reports the following medications:  Current Outpatient Medications  Medication Sig Dispense Refill  . acetaminophen (TYLENOL) 500 MG tablet Take 1,000 mg by mouth as needed for moderate pain.     . fluticasone (FLONASE) 50 MCG/ACT nasal spray Place 1-2 sprays into both nostrils daily as needed for allergies or rhinitis.    Marland Kitchen HYDROcodone-acetaminophen (NORCO/VICODIN) 5-325 MG tablet One tablet every six hours for pain. 20 tablet 0  . naproxen (NAPROSYN) 500 MG tablet Take 1 tablet (500 mg total) by mouth 2 (two) times daily with a meal. 60 tablet 5  . omeprazole (PRILOSEC OTC) 20 MG tablet Take 20 mg by mouth daily.    . promethazine (PHENERGAN) 25 MG tablet Take 1 tablet (25 mg total) by mouth every 6 (six) hours as  needed for nausea or vomiting. May take an additional 1/2 tablet if needed. (Patient taking differently: Take 25 mg by mouth as needed for nausea or vomiting. May take an additional 1/2 tablet if needed.) 30 tablet 0  . tiZANidine (ZANAFLEX) 4 MG tablet Take 0.5-1 tablets (2-4 mg total) by mouth every 6 (six) hours as needed for muscle spasms. (Patient not taking: Reported on 05/31/2019) 10 tablet 0   No current facility-administered medications for this visit.    Patient confirms/reports the following allergies:  Allergies  Allergen Reactions  . Penicillins Shortness Of Breath and Swelling    Has patient had a PCN reaction causing immediate rash, facial/tongue/throat swelling, SOB or lightheadedness with hypotension: Yes Has patient had a PCN reaction causing severe rash involving mucus membranes or skin necrosis: No Has patient had a PCN reaction that required hospitalization No Has patient had a PCN reaction occurring within the last 10 years: No If all of the above answers are "NO", then may proceed with Cephalosporin use.   . Sulfa Antibiotics     Nausea, itching, headache    No orders of the defined types were placed in this encounter.   AUTHORIZATION INFORMATION Primary Insurance: BCBS of [place],  [place] #:[id number] ,  Group #: [id number] Pre-Cert / Josem Kaufmann required: No, file with local BCBS  SCHEDULE INFORMATION: Procedure has been scheduled as follows:  Date: 07/15/2019, Time: 8:30 Location: [facility] with Dr. Gala Romney  This Gastroenterology Pre-Precedure Review Form is being routed to the  following provider(s): Roseanne Kaufman, NP

## 2019-06-01 NOTE — Progress Notes (Signed)
Called pt and she said that she has been taking narcotic to help with shoulder pain.  She takes 1 tablet every 6 hours.  She said that she has been having pain since her shoulder surgery back in 2016.  She said that she is scheduled for a MRI tomorrow.  She said she was prescribed 20 pills until she has her appt with Dr. Luna Glasgow.

## 2019-06-02 ENCOUNTER — Ambulatory Visit (HOSPITAL_COMMUNITY)
Admission: RE | Admit: 2019-06-02 | Discharge: 2019-06-02 | Disposition: A | Discharge status: Home / Self Care | Payer: BC Managed Care – PPO | Source: Ambulatory Visit | Attending: Orthopaedic Surgery | Admitting: Orthopaedic Surgery

## 2019-06-02 ENCOUNTER — Other Ambulatory Visit: Payer: Self-pay

## 2019-06-02 DIAGNOSIS — M25511 Pain in right shoulder: Secondary | ICD-10-CM | POA: Diagnosis not present

## 2019-06-02 DIAGNOSIS — G8929 Other chronic pain: Secondary | ICD-10-CM | POA: Diagnosis present

## 2019-06-06 ENCOUNTER — Telehealth: Payer: Self-pay | Admitting: *Deleted

## 2019-06-06 NOTE — Progress Notes (Signed)
Needs office visit for Propofol due to narcotics.

## 2019-06-06 NOTE — Progress Notes (Signed)
Called pt and scheduled her for an ov on 06/29/2019 at 3:30 with AB.

## 2019-06-06 NOTE — Telephone Encounter (Signed)
Lmom for pt to call me back.  Pt will need ov.

## 2019-06-07 ENCOUNTER — Ambulatory Visit (INDEPENDENT_AMBULATORY_CARE_PROVIDER_SITE_OTHER): Payer: BC Managed Care – PPO | Admitting: Orthopaedic Surgery

## 2019-06-07 ENCOUNTER — Encounter: Payer: Self-pay | Admitting: Orthopaedic Surgery

## 2019-06-07 ENCOUNTER — Other Ambulatory Visit: Payer: Self-pay

## 2019-06-07 VITALS — BP 136/85 | HR 61 | Ht 64.0 in | Wt 152.0 lb

## 2019-06-07 DIAGNOSIS — F17210 Nicotine dependence, cigarettes, uncomplicated: Secondary | ICD-10-CM | POA: Diagnosis not present

## 2019-06-07 DIAGNOSIS — G8929 Other chronic pain: Secondary | ICD-10-CM

## 2019-06-07 DIAGNOSIS — M25511 Pain in right shoulder: Secondary | ICD-10-CM

## 2019-06-07 NOTE — Progress Notes (Signed)
Patient [id number] Carol [name], female DOB:Feb 03, 1961, 59 y.o. [id number]  Chief Complaint  Patient presents with  . Shoulder Pain    right  . Results    review MRI     HPI  Carol [name] is a 59 y.o. female who has continued pain of the right shoulder.  She had MRI of the shoulder which showed: IMPRESSION: 1. Improved now mild supraspinatus tendinosis with new small intrasubstance tear of the distal tendon. No high-grade rotator cuff tear. 2. New tiny tear of the posterior labrum. 3. Interval biceps tenodesis.  She has had surgery on this shoulder by Dr. Lorin Mercy in the past.  She would like to see him in [place] about this now.  I will make appointment. She does not want to go to PT now.  Body mass index is 26.09 kg/m.  ROS  Review of Systems  Constitutional: Positive for activity change.  Musculoskeletal: Positive for arthralgias.  All other systems reviewed and are negative.   All other systems reviewed and are negative.  The following is a summary of the past history medically, past history surgically, known current medicines, social history and family history.  This information is gathered electronically by the computer from prior information and documentation.  I review this each visit and have found including this information at this point in the chart is beneficial and informative.    Past Medical History:  Diagnosis Date  . Chronic nausea   . Diverticula of colon   . GERD (gastroesophageal reflux disease)   . Ovarian cyst   . Renal disorder    kidney stones    Past Surgical History:  Procedure Laterality Date  . APPENDECTOMY  2010  . BREAST LUMPECTOMY     papilloma per pathology  . CHOLECYSTECTOMY  1997  . COLONOSCOPY  2010   repeat in 5 years; family hx colon ca and polypectomy  . COLONOSCOPY N/A 03/09/2014   Dr. Gala Romney: tubular adenoma, pancolonic diverticulosis. Surveillance due in 2021.   . ESOPHAGOGASTRODUODENOSCOPY N/A 03/09/2014   Dr. Gala Romney: non-critical  Schatzki's ring, not manipulated, mild erosive reflux esophagitis, gastric and duodenal erosions with negative H.pylori  . KIDNEY STONE SURGERY    . SHOULDER SURGERY    . TUBAL LIGATION      Family History  Problem Relation Age of Onset  . Heart disease Father        Pacemaker  . CAD Mother 38       CABG  . Cancer Mother        Died age 72 of colon CA  . Atrial fibrillation Sister     Social History Social History   Tobacco Use  . Smoking status: Current Every Day Smoker    Packs/day: 1.00    Years: 35.00    Pack years: 35.00    Types: Cigarettes  . Smokeless tobacco: Never Used  . Tobacco comment: Smokes about a pack a day  Substance Use Topics  . Alcohol use: Yes    Alcohol/week: 0.0 standard drinks    Comment: socially/rarely  . Drug use: No    Allergies  Allergen Reactions  . Penicillins Shortness Of Breath and Swelling    Has patient had a PCN reaction causing immediate rash, facial/tongue/throat swelling, SOB or lightheadedness with hypotension: Yes Has patient had a PCN reaction causing severe rash involving mucus membranes or skin necrosis: No Has patient had a PCN reaction that required hospitalization No Has patient had a PCN reaction occurring within the last 10  years: No If all of the above answers are "NO", then may proceed with Cephalosporin use.   . Sulfa Antibiotics     Nausea, itching, headache    Current Outpatient Medications  Medication Sig Dispense Refill  . acetaminophen (TYLENOL) 500 MG tablet Take 1,000 mg by mouth as needed for moderate pain.     . fluticasone (FLONASE) 50 MCG/ACT nasal spray Place 1-2 sprays into both nostrils daily as needed for allergies or rhinitis.    Marland Kitchen HYDROcodone-acetaminophen (NORCO/VICODIN) 5-325 MG tablet One tablet every six hours for pain. 20 tablet 0  . naproxen (NAPROSYN) 500 MG tablet Take 1 tablet (500 mg total) by mouth 2 (two) times daily with a meal. 60 tablet 5  . omeprazole (PRILOSEC OTC) 20 MG tablet  Take 20 mg by mouth daily.    . promethazine (PHENERGAN) 25 MG tablet Take 1 tablet (25 mg total) by mouth every 6 (six) hours as needed for nausea or vomiting. May take an additional 1/2 tablet if needed. (Patient taking differently: Take 25 mg by mouth as needed for nausea or vomiting. May take an additional 1/2 tablet if needed.) 30 tablet 0  . tiZANidine (ZANAFLEX) 4 MG tablet Take 0.5-1 tablets (2-4 mg total) by mouth every 6 (six) hours as needed for muscle spasms. 10 tablet 0   No current facility-administered medications for this visit.     Physical Exam  Blood pressure 136/85, pulse 61, height 5\' 4"  (1.626 m), weight 152 lb (68.9 kg).  Constitutional: overall normal hygiene, normal nutrition, well developed, normal grooming, normal body habitus. Assistive device:none  Musculoskeletal: gait and station Limp none, muscle tone and strength are normal, no tremors or atrophy is present.  .  Neurological: coordination overall normal.  Deep tendon reflex/nerve stretch intact.  Sensation normal.  Cranial nerves II-XII intact.   Skin:   Normal overall no scars, lesions, ulcers or rashes. No psoriasis.  Psychiatric: Alert and oriented x 3.  Recent memory intact, remote memory unclear.  Normal mood and affect. Well groomed.  Good eye contact.  Cardiovascular: overall no swelling, no varicosities, no edema bilaterally, normal temperatures of the legs and arms, no clubbing, cyanosis and good capillary refill.  Lymphatic: palpation is normal.  Right shoulder has good motion but pain in the extremes.  NV intact.  All other systems reviewed and are negative   The patient has been educated about the nature of the problem(s) and counseled on treatment options.  The patient appeared to understand what I have discussed and is in agreement with it.  Encounter Diagnoses  Name Primary?  . Chronic right shoulder pain Yes  . Nicotine dependence, cigarettes, uncomplicated     PLAN Call if any  problems.  Precautions discussed.  Continue current medications. Continue the naprosyn.  Return to clinic to see Dr. Lorin Mercy.   Electronically Signed Sanjuana Kava, MD 4/20/202110:11 AM

## 2019-06-07 NOTE — Addendum Note (Signed)
Addended by: Derek Mound A on: 06/07/2019 12:05 PM   Modules accepted: Orders

## 2019-06-07 NOTE — Patient Instructions (Addendum)
Steps to Quit Smoking Smoking tobacco is the leading cause of preventable death. It can affect almost every organ in the body. Smoking puts you and people around you at risk for many serious, long-lasting (chronic) diseases. Quitting smoking can be hard, but it is one of the best things that you can do for your health. It is never too late to quit. How do I get ready to quit? When you decide to quit smoking, make a plan to help you succeed. Before you quit:  Pick a date to quit. Set a date within the next 2 weeks to give you time to prepare.  Write down the reasons why you are quitting. Keep this list in places where you will see it often.  Tell your family, friends, and co-workers that you are quitting. Their support is important.  Talk with your doctor about the choices that may help you quit.  Find out if your health insurance will pay for these treatments.  Know the people, places, things, and activities that make you want to smoke (triggers). Avoid them. What first steps can I take to quit smoking?  Throw away all cigarettes at home, at work, and in your car.  Throw away the things that you use when you smoke, such as ashtrays and lighters.  Clean your car. Make sure to empty the ashtray.  Clean your home, including curtains and carpets. What can I do to help me quit smoking? Talk with your doctor about taking medicines and seeing a counselor at the same time. You are more likely to succeed when you do both.  If you are pregnant or breastfeeding, talk with your doctor about counseling or other ways to quit smoking. Do not take medicine to help you quit smoking unless your doctor tells you to do so. To quit smoking: Quit right away  Quit smoking totally, instead of slowly cutting back on how much you smoke over a period of time.  Go to counseling. You are more likely to quit if you go to counseling sessions regularly. Take medicine You may take medicines to help you quit. Some  medicines need a prescription, and some you can buy over-the-counter. Some medicines may contain a drug called nicotine to replace the nicotine in cigarettes. Medicines may:  Help you to stop having the desire to smoke (cravings).  Help to stop the problems that come when you stop smoking (withdrawal symptoms). Your doctor may ask you to use:  Nicotine patches, gum, or lozenges.  Nicotine inhalers or sprays.  Non-nicotine medicine that is taken by mouth. Find resources Find resources and other ways to help you quit smoking and remain smoke-free after you quit. These resources are most helpful when you use them often. They include:  Online chats with a counselor.  Phone quitlines.  Printed self-help materials.  Support groups or group counseling.  Text messaging programs.  Mobile phone apps. Use apps on your mobile phone or tablet that can help you stick to your quit plan. There are many free apps for mobile phones and tablets as well as websites. Examples include Quit Guide from the CDC and smokefree.gov  What things can I do to make it easier to quit?   Talk to your family and friends. Ask them to support and encourage you.  Call a phone quitline (1-800-QUIT-NOW), reach out to support groups, or work with a counselor.  Ask people who smoke to not smoke around you.  Avoid places that make you want to smoke,   such as: ? Bars. ? Parties. ? Smoke-break areas at work.  Spend time with people who do not smoke.  Lower the stress in your life. Stress can make you want to smoke. Try these things to help your stress: ? Getting regular exercise. ? Doing deep-breathing exercises. ? Doing yoga. ? Meditating. ? Doing a body scan. To do this, close your eyes, focus on one area of your body at a time from head to toe. Notice which parts of your body are tense. Try to relax the muscles in those areas. How will I feel when I quit smoking? Day 1 to 3 weeks Within the first 24 hours,  you may start to have some problems that come from quitting tobacco. These problems are very bad 2-3 days after you quit, but they do not often last for more than 2-3 weeks. You may get these symptoms:  Mood swings.  Feeling restless, nervous, angry, or annoyed.  Trouble concentrating.  Dizziness.  Strong desire for high-sugar foods and nicotine.  Weight gain.  Trouble pooping (constipation).  Feeling like you may vomit (nausea).  Coughing or a sore throat.  Changes in how the medicines that you take for other issues work in your body.  Depression.  Trouble sleeping (insomnia). Week 3 and afterward After the first 2-3 weeks of quitting, you may start to notice more positive results, such as:  Better sense of smell and taste.  Less coughing and sore throat.  Slower heart rate.  Lower blood pressure.  Clearer skin.  Better breathing.  Fewer sick days. Quitting smoking can be hard. Do not give up if you fail the first time. Some people need to try a few times before they succeed. Do your best to stick to your quit plan, and talk with your doctor if you have any questions or concerns. Summary  Smoking tobacco is the leading cause of preventable death. Quitting smoking can be hard, but it is one of the best things that you can do for your health.  When you decide to quit smoking, make a plan to help you succeed.  Quit smoking right away, not slowly over a period of time.  When you start quitting, seek help from your doctor, family, or friends. This information is not intended to replace advice given to you by your health care provider. Make sure you discuss any questions you have with your health care provider. Document Revised: 10/29/2018 Document Reviewed: 04/24/2018 Elsevier Patient Education  [place].     Referred to Dr. Lorin Mercy in [place]

## 2019-06-09 ENCOUNTER — Encounter: Payer: Self-pay | Admitting: Orthopaedic Surgery

## 2019-06-09 ENCOUNTER — Ambulatory Visit (INDEPENDENT_AMBULATORY_CARE_PROVIDER_SITE_OTHER): Payer: BC Managed Care – PPO | Admitting: Orthopaedic Surgery

## 2019-06-09 ENCOUNTER — Other Ambulatory Visit: Payer: Self-pay

## 2019-06-09 VITALS — Ht 64.0 in | Wt 152.0 lb

## 2019-06-09 DIAGNOSIS — M7541 Impingement syndrome of right shoulder: Secondary | ICD-10-CM

## 2019-06-09 NOTE — Progress Notes (Signed)
Office Visit Note   Patient: Carol [name]           Date of Birth: 02/23/1960           MRN: [id number] Visit Date: 06/09/2019              Requested by: Sanjuana Kava, [address],  [place] [address] PCP: Claretta Fraise, MD   Assessment & Plan: Visit Diagnoses:  1. Impingement syndrome of right shoulder     Plan: We reviewed her recent MRI scan.  She does have some partial intrasubstance tearing of the distal tendon.  Tiny tear of the posterior labrum of questionable significance.  Subacromial injection was performed which reproduced some of her pain postinjection she had improvement.  We will see how she does with this if she is having persistent problems she will let us know.  We discussed possibility of operative intervention but she understands that repair of the rotator cuff would require cutting off some tendon fibers that are firmly attached in order to repair the tendon back to bone.  She may get good relief from the injection alone.  If she has persistent problems she will call back and let us know in a week.  Follow-Up Instructions: No follow-ups on file.   Orders:  Orders Placed This Encounter  Procedures  . Large Joint Inj: R subacromial bursa   No orders of the defined types were placed in this encounter.     Procedures: Large Joint Inj: R subacromial bursa on 06/09/2019 4:09 PM Indications: pain Details: 22 G 1.5 in needle  Arthrogram: No  Medications: 4 mL bupivacaine 0.25 %; 40 mg methylPREDNISolone acetate 40 MG/ML; 0.5 mL lidocaine 1 % Outcome: tolerated well, no immediate complications Procedure, treatment alternatives, risks and benefits explained, specific risks discussed. Consent was given by the patient. Immediately prior to procedure a time out was called to verify the correct patient, procedure, equipment, support staff and site/side marked as required. Patient was prepped and draped in the usual sterile fashion.       Clinical  Data: No additional findings.   Subjective: Chief Complaint  Patient presents with  . Right Shoulder - Pain    HPI 59 year old female with ongoing pain in her right shoulder.  She is seeing Dr. Sanjuana Kava and had an MRI scan that showed mild supraspinatus tendinosis with a new small and intrasubstance tear of the distal tendon and improvement of the proximal supraspinatus tendinosis.  Tiny tear of the posterior labrum and documentation of her previous biceps tenodesis which was done by me.  She states her shoulder had been doing well until March.  In January she had virtual visit with primary care concerning ongoing problems with chronic back pain.  She had 2 prescriptions for her shoulder hydrocodone 5/325 total of 30 tablets and 1 for 20 tablets.  Otherwise [place] report unremarkable.  She had been given a note keeping her out of work by Dr.Keeling.  MRI scan of her shoulder was done on April/15/2021.  Comparison to April 2016 MRI which was before her biceps tenodesis. She works at [employer].  Review of Systems 14 point system updated noncontributory.   Objective: Vital Signs: Ht 5\' 4"  (1.626 m)   Wt 152 lb (68.9 kg)   BMI 26.09 kg/m   Physical Exam Constitutional:      Appearance: She is well-developed.  HENT:     Head: Normocephalic.     Right Ear: External ear normal.  Left Ear: External ear normal.  Eyes:     Pupils: Pupils are equal, round, and reactive to light.  Neck:     Thyroid: No thyromegaly.     Trachea: No tracheal deviation.  Cardiovascular:     Rate and Rhythm: Normal rate.  Pulmonary:     Effort: Pulmonary effort is normal.  Abdominal:     Palpations: Abdomen is soft.  Skin:    General: Skin is warm and dry.  Neurological:     Mental Status: She is alert and oriented to person, place, and time.  Psychiatric:        Behavior: Behavior normal.     Ortho Exam patient has positive Neer test negative Hawkins test.  No tenderness over the biceps  groove incision for her tenodesis site.  Arthroscopic portals well-healed.  Some pain with resisted supraspinatus testing but negative drop arm test.  She can reach behind her past posterior axillary line.  Specialty Comments:  No specialty comments available.  Imaging: No results found.   PMFS History: Patient Active Problem List   Diagnosis Date Noted  . Impingement syndrome of right shoulder 06/13/2019  . GERD (gastroesophageal reflux disease) 05/09/2014  . Mucosal abnormality of stomach   . Chronic nausea 02/07/2014  . Dyspepsia 02/07/2014  . History of colonic polyps 02/07/2014  . Menopausal state 01/02/2014  . Chronic neck pain 08/01/2013  . Numbness and tingling of right arm 08/01/2013   Past Medical History:  Diagnosis Date  . Chronic nausea   . Diverticula of colon   . GERD (gastroesophageal reflux disease)   . Ovarian cyst   . Renal disorder    kidney stones    Family History  Problem Relation Age of Onset  . Heart disease Father        Pacemaker  . CAD Mother 36       CABG  . Cancer Mother        Died age 62 of colon CA  . Atrial fibrillation Sister     Past Surgical History:  Procedure Laterality Date  . APPENDECTOMY  2010  . BREAST LUMPECTOMY     papilloma per pathology  . CHOLECYSTECTOMY  1997  . COLONOSCOPY  2010   repeat in 5 years; family hx colon ca and polypectomy  . COLONOSCOPY N/A 03/09/2014   Dr. Gala Romney: tubular adenoma, pancolonic diverticulosis. Surveillance due in 2021.   . ESOPHAGOGASTRODUODENOSCOPY N/A 03/09/2014   Dr. Gala Romney: non-critical Schatzki's ring, not manipulated, mild erosive reflux esophagitis, gastric and duodenal erosions with negative H.pylori  . KIDNEY STONE SURGERY    . SHOULDER SURGERY    . TUBAL LIGATION     Social History   Occupational History    Employer: [id number]  Tobacco Use  . Smoking status: Current Every Day Smoker    Packs/day: 1.00    Years: 35.00    Pack years: 35.00    Types: Cigarettes  . Smokeless  tobacco: Never Used  . Tobacco comment: Smokes about a pack a day  Substance and Sexual Activity  . Alcohol use: Yes    Alcohol/week: 0.0 standard drinks    Comment: socially/rarely  . Drug use: No  . Sexual activity: Not on file

## 2019-06-10 NOTE — Telephone Encounter (Signed)
Patient also called in addition to her with her request for an extension of her out of work status to 06/26/19, with return date of 06/27/19 - states she did see Dr Lorin Mercy per referral by Dr Luna Glasgow; said had an injection which she is hoping helps her shoulder to feel better.  States Dr Lorin Mercy recommended continuing her short term disability through our clinic. Please advise.

## 2019-06-13 DIAGNOSIS — M7541 Impingement syndrome of right shoulder: Secondary | ICD-10-CM | POA: Insufficient documentation

## 2019-06-13 MED ORDER — METHYLPREDNISOLONE ACETATE 40 MG/ML IJ SUSP
40.0000 mg | INTRAMUSCULAR | Status: AC | PRN
  Administered 2019-06-09: 40 mg via INTRA_ARTICULAR

## 2019-06-13 MED ORDER — LIDOCAINE HCL 1 % IJ SOLN
0.5000 mL | INTRAMUSCULAR | Status: AC | PRN
  Administered 2019-06-09: .5 mL

## 2019-06-13 MED ORDER — BUPIVACAINE HCL 0.25 % IJ SOLN
4.0000 mL | INTRAMUSCULAR | Status: AC | PRN
  Administered 2019-06-09: 16:00:00 4 mL via INTRA_ARTICULAR

## 2019-06-13 NOTE — Telephone Encounter (Signed)
Patient relayed as noted that Dr Lorin Mercy recommended continuing her short term disability communication through our office - does not have a follow up scheduled at this time with Dr Lorin Mercy; trying the injection. Can we extend the out of work or is patient's end date for out of work 06/15/19, per her most recent note?

## 2019-06-14 ENCOUNTER — Encounter: Payer: Self-pay | Admitting: Orthopaedic Surgery

## 2019-06-14 ENCOUNTER — Telehealth: Payer: Self-pay | Admitting: Orthopaedic Surgery

## 2019-06-14 NOTE — Telephone Encounter (Signed)
Patient has an injection last week and is still feeling pain and wanted to know if that is normal.  CB#[phone].  Thank you.

## 2019-06-14 NOTE — Telephone Encounter (Signed)
Patient had shoulder injection on 06/09/2019. Per your office note, she was going to call you and let you know if she has continued problems in a week. Please advise.

## 2019-06-14 NOTE — Telephone Encounter (Signed)
Called patient per Dr Brooke Bonito response - patient aware of work note per Dr Luna Glasgow.

## 2019-06-14 NOTE — Telephone Encounter (Signed)
I called discussed.  

## 2019-06-15 ENCOUNTER — Telehealth: Payer: Self-pay | Admitting: Orthopaedic Surgery

## 2019-06-15 ENCOUNTER — Ambulatory Visit: Payer: BC Managed Care – PPO | Admitting: Orthopaedic Surgery

## 2019-06-15 ENCOUNTER — Encounter: Payer: Self-pay | Admitting: Orthopaedic Surgery

## 2019-06-15 NOTE — Telephone Encounter (Signed)
Patient called stating that her [name]) needed a note faxed to them at [phone] stating what her expected return to work date is.  Thank you.

## 2019-06-15 NOTE — Telephone Encounter (Signed)
e

## 2019-06-16 ENCOUNTER — Other Ambulatory Visit: Payer: Self-pay | Admitting: Orthopaedic Surgery

## 2019-06-16 MED ORDER — HYDROCODONE-ACETAMINOPHEN 5-325 MG PO TABS: 1.0000 | ORAL_TABLET | Freq: Two times a day (BID) | ORAL | 0 refills | Status: DC | PRN

## 2019-06-16 NOTE — Telephone Encounter (Signed)
Noted completed and faxed. Per Dr. Lorin Mercy, ok to return to work Monday 06/20/2019.

## 2019-06-29 ENCOUNTER — Other Ambulatory Visit: Payer: Self-pay

## 2019-06-29 ENCOUNTER — Encounter: Payer: Self-pay | Admitting: Gastroenterology

## 2019-06-29 ENCOUNTER — Ambulatory Visit (INDEPENDENT_AMBULATORY_CARE_PROVIDER_SITE_OTHER): Payer: BC Managed Care – PPO | Admitting: Gastroenterology

## 2019-06-29 VITALS — BP 128/78 | HR 75 | Temp 97.7°F | Ht 65.0 in | Wt 152.2 lb

## 2019-06-29 DIAGNOSIS — R110 Nausea: Secondary | ICD-10-CM

## 2019-06-29 DIAGNOSIS — Z86010 Personal history of colonic polyps: Secondary | ICD-10-CM | POA: Diagnosis not present

## 2019-06-29 MED ORDER — PROMETHAZINE HCL 25 MG PO TABS: 12.5000 mg | ORAL_TABLET | Freq: Four times a day (QID) | ORAL | 0 refills | Status: DC | PRN

## 2019-06-29 MED ORDER — PANTOPRAZOLE SODIUM 40 MG PO TBEC: 40.0000 mg | DELAYED_RELEASE_TABLET | Freq: Every day | ORAL | 3 refills | Status: DC

## 2019-06-29 NOTE — Progress Notes (Signed)
Primary Care Physician:  Claretta Fraise, MD Primary Gastroenterologist:  Dr. Gala Romney   Chief Complaint  Patient presents with  . Colonoscopy    HPI:   Carol [name] is a 59 y.o. female presenting today to arrange surveillance colonoscopy. Last completed in 2016 with tubular adenoma. Family history of colon cancer in mother, passing away at age 16.   She has been dealing with a shoulder injury and needing narcotics. Tries to take sparingly.   No abdominal pain. Has chronic nausea. This has been evaluated previously. Normal GES. EGD with erosive esophagitis, gastric and duodenal erosions. Promethazine helped in the past. Would just take 1/2 tablet. Occasional reflux. Hasn't taken omeprazole for awhile. Flares not often. No significant change in bowel habits. Sometimes will have diarrhea with certain foods. Has noticed this after cholecystectomy.   Past Medical History:  Diagnosis Date  . Chronic nausea   . Diverticula of colon   . GERD (gastroesophageal reflux disease)   . Ovarian cyst   . Renal disorder    kidney stones    Past Surgical History:  Procedure Laterality Date  . APPENDECTOMY  2010  . BREAST LUMPECTOMY     papilloma per pathology  . CHOLECYSTECTOMY  1997  . COLONOSCOPY  2010   repeat in 5 years; family hx colon ca and polypectomy  . COLONOSCOPY N/A 03/09/2014   Dr. Gala Romney: tubular adenoma, pancolonic diverticulosis. Surveillance due in 2021.   . ESOPHAGOGASTRODUODENOSCOPY N/A 03/09/2014   Dr. Gala Romney: non-critical Schatzki's ring, not manipulated, mild erosive reflux esophagitis, gastric and duodenal erosions with negative H.pylori  . KIDNEY STONE SURGERY    . SHOULDER SURGERY    . TUBAL LIGATION      Current Outpatient Medications  Medication Sig Dispense Refill  . acetaminophen (TYLENOL) 500 MG tablet Take 1,000 mg by mouth as needed for moderate pain.     . fluticasone (FLONASE) 50 MCG/ACT nasal spray Place 1-2 sprays into both nostrils daily as needed for  allergies or rhinitis.    Marland Kitchen HYDROcodone-acetaminophen (NORCO/VICODIN) 5-325 MG tablet Take 1 tablet by mouth every 12 (twelve) hours as needed for moderate pain. 20 tablet 0  . pantoprazole (PROTONIX) 40 MG tablet Take 1 tablet (40 mg total) by mouth daily. Take 30 minutes before breakfast 30 tablet 3  . promethazine (PHENERGAN) 25 MG tablet Take 0.5 tablets (12.5 mg total) by mouth every 6 (six) hours as needed for nausea or vomiting. 30 tablet 0   No current facility-administered medications for this visit.    Allergies as of 06/29/2019 - Review Complete 06/29/2019  Allergen Reaction Noted  . Penicillins Shortness Of Breath and Swelling 05/27/2011  . Sulfa antibiotics  02/07/2014    Family History  Problem Relation Age of Onset  . Heart disease Father        Pacemaker  . CAD Mother 80       CABG  . Cancer Mother        Died age 63 of colon CA  . Atrial fibrillation Sister   . Brain cancer Sister        metastatic lung cancer.   . Lung cancer Sister        deceased in her early 59s    Social History   Socioeconomic History  . Marital status: Married    Spouse name: Not on file  . Number of children: 4  . Years of education: Not on file  . Highest education level: Not on file  Occupational  History    Employer: [id number]    Comment: deli  Tobacco Use  . Smoking status: Current Every Day Smoker    Packs/day: 1.00    Years: 35.00    Pack years: 35.00    Types: Cigarettes  . Smokeless tobacco: Never Used  . Tobacco comment: Smokes about a pack a day  Substance and Sexual Activity  . Alcohol use: Yes    Alcohol/week: 0.0 standard drinks    Comment: socially/rarely  . Drug use: No  . Sexual activity: Not on file  Other Topics Concern  . Not on file  Social History Narrative   Lives at home with husband   Social Determinants of Health   Financial Resource Strain:   . Difficulty of Paying Living Expenses:   Food Insecurity:   . Worried About Charity fundraiser  in the Last Year:   . Arboriculturist in the Last Year:   Transportation Needs:   . Film/video editor (Medical):   Marland Kitchen Lack of Transportation (Non-Medical):   Physical Activity:   . Days of Exercise per Week:   . Minutes of Exercise per Session:   Stress:   . Feeling of Stress :   Social Connections:   . Frequency of Communication with Friends and Family:   . Frequency of Social Gatherings with Friends and Family:   . Attends Religious Services:   . Active Member of Clubs or Organizations:   . Attends Archivist Meetings:   Marland Kitchen Marital Status:   Intimate Partner Violence:   . Fear of Current or Ex-Partner:   . Emotionally Abused:   Marland Kitchen Physically Abused:   . Sexually Abused:     Review of Systems: Gen: Denies any fever, chills, fatigue, weight loss, lack of appetite.  CV: Denies chest pain, heart palpitations, peripheral edema, syncope.  Resp: Denies shortness of breath at rest or with exertion. Denies wheezing or cough.  GI: see HPI GU : Denies urinary burning, urinary frequency, urinary hesitancy MS: Denies joint pain, muscle weakness, cramps, or limitation of movement.  Derm: Denies rash, itching, dry skin Psych: Denies depression, anxiety, memory loss, and confusion Heme: Denies bruising, bleeding, and enlarged lymph nodes.  Physical Exam: BP 128/78   Pulse 75   Temp 97.7 F (36.5 C) (Temporal)   Ht 5\' 5"  (1.651 m)   Wt 152 lb 3.2 oz (69 kg)   BMI 25.33 kg/m  General:   Alert and oriented. Pleasant and cooperative. Well-nourished and well-developed.  Head:  Normocephalic and atraumatic. Eyes:  Without icterus, sclera clear and conjunctiva pink.  Ears:  Normal auditory acuity. Mouth:  Mask in place Lungs:  Clear to auscultation bilaterally. No wheezes, rales, or rhonchi. No distress.  Heart:  S1, S2 present without murmurs appreciated.  Abdomen:  +BS, soft, non-tender and non-distended. No HSM noted. No guarding or rebound. No masses appreciated.    Rectal:  Deferred  Msk:  Symmetrical without gross deformities. Normal posture. Extremities:  Without edema. Neurologic:  Alert and  oriented x4;  grossly normal neurologically. Skin:  Intact without significant lesions or rashes. Psych:  Alert and cooperative. Normal mood and affect.  ASSESSMENT: Carol [name] is a 59 y.o. female presenting today with history of chronic nausea, intermittent reflux, history of colon polyps and positive family history in first-degree relative (mother, passing away age 54).   Due for surveillance colonoscopy now. In setting of intermittent narcotics for shoulder injury, will pursue with Propofol.  We  also discussed chronic nausea and resuming PPI daily in light of history of esophagitis. Low-dose phenergan has helped historically, and I refilled a short-term supply. GES normal.    PLAN:  Proceed with TCS with Dr. Gala Romney in near future with Propofol: the risks, benefits, and alternatives have been discussed with the patient in detail. The patient states understanding and desires to proceed.  Protonix once daily sent to pharmacy.  Phenergan 12.5 mg prn nausea: discussed side effects and limiting  Return in 6 months  Annitta Needs, PhD, [facility]

## 2019-06-29 NOTE — Progress Notes (Signed)
CC'ED TO PCP 

## 2019-06-29 NOTE — Patient Instructions (Signed)
I have sent in Protonix to take 30 minutes before breakfast daily. This is for reflux. It may help your other symptoms, too. Let me know if this is not covered well.   I sent in phenergan to take as needed for severe nausea. Make sure to not drive or operate machinery while taking, as this can cause drowsiness. Limit taking this with hydrocodone as we talked about.  We are arranging a colonoscopy in the near future with Dr. Gala Romney!  I would like to see you in 6 months to make sure you are doing better.  I enjoyed seeing you again today! As you know, I value our relationship and want to provide genuine, compassionate, and quality care. I welcome your feedback. If you receive a survey regarding your visit,  I greatly appreciate you taking time to fill this out. See you next time!  Annitta Needs, PhD, ANP-BC [facility] Gastroenterology

## 2019-06-30 ENCOUNTER — Encounter: Payer: Self-pay | Admitting: Gastroenterology

## 2019-07-13 ENCOUNTER — Other Ambulatory Visit (HOSPITAL_COMMUNITY): Payer: BC Managed Care – PPO

## 2019-08-29 ENCOUNTER — Telehealth: Payer: Self-pay | Admitting: *Deleted

## 2019-08-29 ENCOUNTER — Telehealth: Payer: Self-pay | Admitting: Internal Medicine

## 2019-08-29 NOTE — Telephone Encounter (Signed)
LMOVM to schedule TCS with propofol with RMR

## 2019-08-29 NOTE — Telephone Encounter (Signed)
Pt was returning call to [place]. [phone]

## 2019-08-29 NOTE — Telephone Encounter (Signed)
Called patient back. She has been scheduled for procedure 8/12 at 2:00pm. Aware will need covid test prior. Will mail with prpe instructions. She will get OTC miralax. Confirmed mailing address.

## 2019-08-29 NOTE — Telephone Encounter (Signed)
Pt called again. I told her that the scheduler would call her back when she wasn't with another patient. She said that she was getting ready to go to work and was afraid she would miss her call.

## 2019-09-12 ENCOUNTER — Other Ambulatory Visit: Payer: Self-pay

## 2019-09-12 ENCOUNTER — Encounter (HOSPITAL_COMMUNITY)
Admission: RE | Admit: 2019-09-12 | Discharge: 2019-09-12 | Disposition: A | Discharge status: Home / Self Care | Payer: BC Managed Care – PPO | Source: Ambulatory Visit | Attending: Internal Medicine | Admitting: Internal Medicine

## 2019-09-12 ENCOUNTER — Encounter (HOSPITAL_COMMUNITY): Payer: Self-pay

## 2019-09-12 HISTORY — DX: Personal history of urinary calculi: Z87.442

## 2019-09-13 ENCOUNTER — Ambulatory Visit (INDEPENDENT_AMBULATORY_CARE_PROVIDER_SITE_OTHER): Payer: BC Managed Care – PPO

## 2019-09-13 ENCOUNTER — Ambulatory Visit (INDEPENDENT_AMBULATORY_CARE_PROVIDER_SITE_OTHER): Payer: BC Managed Care – PPO | Admitting: Family Medicine

## 2019-09-13 ENCOUNTER — Other Ambulatory Visit: Payer: Self-pay

## 2019-09-13 ENCOUNTER — Encounter (HOSPITAL_COMMUNITY): Payer: BC Managed Care – PPO

## 2019-09-13 VITALS — BP 121/83 | HR 82 | Temp 98.9°F

## 2019-09-13 DIAGNOSIS — Z720 Tobacco use: Secondary | ICD-10-CM | POA: Diagnosis not present

## 2019-09-13 DIAGNOSIS — R05 Cough: Secondary | ICD-10-CM

## 2019-09-13 DIAGNOSIS — R059 Cough, unspecified: Secondary | ICD-10-CM

## 2019-09-13 MED ORDER — PROMETHAZINE-DM 6.25-15 MG/5ML PO SYRP: 2.5000 mL | ORAL_SOLUTION | Freq: Four times a day (QID) | ORAL | 0 refills | Status: DC | PRN

## 2019-09-13 MED ORDER — AZELASTINE HCL 0.1 % NA SOLN: 1.0000 | Freq: Two times a day (BID) | NASAL | 12 refills | Status: DC

## 2019-09-13 MED ORDER — AZITHROMYCIN 250 MG PO TABS: ORAL_TABLET | ORAL | 0 refills | Status: DC

## 2019-09-13 NOTE — Patient Instructions (Addendum)
Cough syrup, nasal spray and antibiotic sent to pharmacy.  Will call with xray and COVID results.    It appears that you have a viral upper respiratory infection (cold).  Cold symptoms can last up to 2 weeks.    - Get plenty of rest and drink plenty of fluids. - Try to breathe moist air. Use a cold mist humidifier. - Consume warm fluids (soup or tea) to provide relief for a stuffy nose and to loosen phlegm. - For nasal stuffiness, try saline nasal spray or a Neti Pot. Afrin nasal spray can also be used but this product should not be used longer than 3 days or it will cause rebound nasal stuffiness (worsening nasal congestion). - For sore throat pain relief: use chloraseptic spray, suck on throat lozenges, hard candy or popsicles; gargle with warm salt water (1/4 tsp. salt per 8 oz. of water); and eat soft, bland foods. - Eat a well-balanced diet. If you cannot, ensure you are getting enough nutrients by taking a daily multivitamin. - Avoid dairy products, as they can thicken phlegm. - Avoid alcohol, as it impairs your bodys immune system.  CONTACT YOUR DOCTOR IF YOU EXPERIENCE ANY OF THE FOLLOWING: - High fever - Ear pain - Sinus-type headache - Unusually severe cold symptoms - Cough that gets worse while other cold symptoms improve - Flare up of any chronic lung problem, such as asthma - Your symptoms persist longer than 2 weeks

## 2019-09-13 NOTE — Progress Notes (Signed)
Subjective: CC: cough, congestion PCP: Claretta Fraise, MD IFO:YDXA Q Porras is a 59 y.o. female presenting to clinic today for:  1. Cough Patient reports a 4 week h/o dry cough that has been worse over last week.  It is worse with lying down at bedtime and improved by nothing so far.  She denies chest congestion, nasal congestion. She reports rhinorrhea. No hemoptysis, shortness of breath, wheeze, chest pain, post tussive emesis, nausea, diarrhea, fever, chills, sick contacts.  Patient denies sick contacts, recent travel.  Possible pulmonary disease : COPD. Active tobacco use.  Patient is NOT UTD on immunizations.NO COVID vaccine  ROS: Per HPI  Allergies  Allergen Reactions  . Penicillins Shortness Of Breath and Swelling    Has patient had a PCN reaction causing immediate rash, facial/tongue/throat swelling, SOB or lightheadedness with hypotension: Yes Has patient had a PCN reaction causing severe rash involving mucus membranes or skin necrosis: No Has patient had a PCN reaction that required hospitalization No Has patient had a PCN reaction occurring within the last 10 years: No If all of the above answers are "NO", then may proceed with Cephalosporin use.   . Sulfa Antibiotics     Nausea, itching, headache   Past Medical History:  Diagnosis Date  . Chronic nausea   . Diverticula of colon   . GERD (gastroesophageal reflux disease)   . History of kidney stones   . Ovarian cyst     Current Outpatient Medications:  .  acetaminophen (TYLENOL) 500 MG tablet, Take 1,000 mg by mouth as needed for moderate pain. , Disp: , Rfl:  .  fluticasone (FLONASE) 50 MCG/ACT nasal spray, Place 1-2 sprays into both nostrils daily as needed for allergies or rhinitis., Disp: , Rfl:  .  HYDROcodone-acetaminophen (NORCO/VICODIN) 5-325 MG tablet, Take 1 tablet by mouth every 12 (twelve) hours as needed for moderate pain., Disp: 20 tablet, Rfl: 0 .  pantoprazole (PROTONIX) 40 MG tablet, Take 1 tablet (40  mg total) by mouth daily. Take 30 minutes before breakfast (Patient not taking: Reported on 09/02/2019), Disp: 30 tablet, Rfl: 3 .  promethazine (PHENERGAN) 25 MG tablet, Take 0.5 tablets (12.5 mg total) by mouth every 6 (six) hours as needed for nausea or vomiting., Disp: 30 tablet, Rfl: 0 Social History   Socioeconomic History  . Marital status: Married    Spouse name: Not on file  . Number of children: 4  . Years of education: Not on file  . Highest education level: Not on file  Occupational History    Employer: [employer]    Comment: deli  Tobacco Use  . Smoking status: Current Every Day Smoker    Packs/day: 1.00    Years: 35.00    Pack years: 35.00    Types: Cigarettes  . Smokeless tobacco: Never Used  . Tobacco comment: Smokes about a pack a day  Vaping Use  . Vaping Use: Never used  Substance and Sexual Activity  . Alcohol use: Yes    Alcohol/week: 0.0 standard drinks    Comment: socially/rarely  . Drug use: No  . Sexual activity: Yes  Other Topics Concern  . Not on file  Social History Narrative   Lives at home with husband   Social Determinants of Health   Financial Resource Strain:   . Difficulty of Paying Living Expenses:   Food Insecurity:   . Worried About Charity fundraiser in the Last Year:   . [place] in the Last Year:  Transportation Needs:   . Film/video editor (Medical):   Marland Kitchen Lack of Transportation (Non-Medical):   Physical Activity:   . Days of Exercise per Week:   . Minutes of Exercise per Session:   Stress:   . Feeling of Stress :   Social Connections:   . Frequency of Communication with Friends and Family:   . Frequency of Social Gatherings with Friends and Family:   . Attends Religious Services:   . Active Member of Clubs or Organizations:   . Attends Archivist Meetings:   Marland Kitchen Marital Status:   Intimate Partner Violence:   . Fear of Current or Ex-Partner:   . Emotionally Abused:   Marland Kitchen Physically Abused:   . Sexually  Abused:    Family History  Problem Relation Age of Onset  . Heart disease Father        Pacemaker  . CAD Mother 36       CABG  . Cancer Mother        Died age 69 of colon CA  . Atrial fibrillation Sister   . Brain cancer Sister        metastatic lung cancer.   . Lung cancer Sister        deceased in her early 69s    Objective: Office vital signs reviewed. BP 121/83   Pulse 82   Temp 98.9 F (37.2 C) (Temporal)   SpO2 96%   Physical Examination:  General: Awake, alert, well nourished, No acute distress HEENT: Normal, sclera white, MMM, no LAD Cardio: regular rate and rhythm, S1S2 heard, no murmurs appreciated Pulm: clear to auscultation bilaterally, no wheezes, rhonchi or rales; normal work of breathing on room air Extremities: warm, well perfused, No edema, cyanosis or clubbing; +2 pulses bilaterally  Assessment/ Plan: 59 y.o. female   1. Cough I personally reviewed CXR. No obvious abnormalities appreciated.  Will call with formal radiology review.  Empiric treatment with ABX given possible COPD and duration of symptoms.  Return precautions reviewed.  Isolate until COVID test results.  Ok to complete work forms if needed.  Follow up prn - DG Chest 2 View; Future - Novel Coronavirus, NAA ([facility]) - promethazine-dextromethorphan (PROMETHAZINE-DM) 6.25-15 MG/5ML syrup; Take 2.5 mLs by mouth 4 (four) times daily as needed for cough.  Dispense: 118 mL; Refill: 0 - azelastine (ASTELIN) 0.1 % nasal spray; Place 1 spray into both nostrils 2 (two) times daily. For nasal drainage  Dispense: 30 mL; Refill: 12 - azithromycin (ZITHROMAX Z-PAK) 250 MG tablet; As directed  Dispense: 1 each; Refill: 0  2. Tobacco use - DG Chest 2 View; Future   No orders of the defined types were placed in this encounter.  No orders of the defined types were placed in this encounter.    Janora Norlander, DO [place] [phone]

## 2019-09-14 LAB — NOVEL CORONAVIRUS, NAA: SARS-CoV-2, NAA: NOT DETECTED

## 2019-09-14 LAB — SARS-COV-2, NAA 2 DAY TAT

## 2019-09-20 ENCOUNTER — Telehealth: Payer: Self-pay | Admitting: Family Medicine

## 2019-09-20 NOTE — Telephone Encounter (Signed)
Patient aware, appt made to be rechecked and retested

## 2019-09-20 NOTE — Telephone Encounter (Signed)
She had a negative COVID test.  Technically ok to go back to work unless this is against her office policy.  If she is still feeling poorly or getting worse, she should consider being rechecked/ retested for COVID.  Not sure what the question is about the colonoscopy.  That's not a reason to stay out of work this week.

## 2019-09-21 ENCOUNTER — Encounter: Payer: Self-pay | Admitting: Family Medicine

## 2019-09-21 ENCOUNTER — Ambulatory Visit (INDEPENDENT_AMBULATORY_CARE_PROVIDER_SITE_OTHER): Payer: BC Managed Care – PPO | Admitting: Family Medicine

## 2019-09-21 ENCOUNTER — Other Ambulatory Visit: Payer: Self-pay

## 2019-09-21 VITALS — BP 114/75 | HR 74 | Temp 98.1°F | Ht 65.0 in | Wt 150.0 lb

## 2019-09-21 DIAGNOSIS — J069 Acute upper respiratory infection, unspecified: Secondary | ICD-10-CM | POA: Diagnosis not present

## 2019-09-21 DIAGNOSIS — J441 Chronic obstructive pulmonary disease with (acute) exacerbation: Secondary | ICD-10-CM | POA: Diagnosis not present

## 2019-09-21 DIAGNOSIS — R432 Parageusia: Secondary | ICD-10-CM

## 2019-09-21 MED ORDER — CEFTRIAXONE SODIUM 1 G IJ SOLR
1.0000 g | Freq: Once | INTRAMUSCULAR | Status: AC
  Administered 2019-09-21: 1 g via INTRAMUSCULAR

## 2019-09-21 MED ORDER — METHYLPREDNISOLONE ACETATE 80 MG/ML IJ SUSP
80.0000 mg | Freq: Once | INTRAMUSCULAR | Status: AC
  Administered 2019-09-21: 80 mg via INTRAMUSCULAR

## 2019-09-21 MED ORDER — ALBUTEROL SULFATE HFA 108 (90 BASE) MCG/ACT IN AERS: 2.0000 | INHALATION_SPRAY | Freq: Four times a day (QID) | RESPIRATORY_TRACT | 0 refills | Status: DC | PRN

## 2019-09-21 MED ORDER — CEFDINIR 300 MG PO CAPS: 300.0000 mg | ORAL_CAPSULE | Freq: Two times a day (BID) | ORAL | 0 refills | Status: DC

## 2019-09-21 NOTE — Progress Notes (Signed)
BP 114/75   Pulse 74   Temp 98.1 F (36.7 C)   Ht 5\' 5"  (1.651 m)   Wt 150 lb (68 kg)   SpO2 97%   BMI 24.96 kg/m    Subjective:   Patient ID: Carol [name], female    DOB: 1960/12/28, 59 y.o.   MRN: [id number]  HPI: KATHLYN LEACHMAN is a 59 y.o. female presenting on 09/21/2019 for URI (covid clinic), Cough, Headache, and loss of taste   HPI Patient is coming in for cough and congestion and loss of taste and smell that has been going on for the past 1 week.  She is a smoker and has used inhalers for bronchitis before.  Patient denies any fevers or chills, she does have sinus pressure and sneezing which improved some with flonase and azithromycin but she is still feeling a lot of chest congestion.  Patient has some wheezing and rattling too.  Relevant past medical, surgical, family and social history reviewed and updated as indicated. Interim medical history since our last visit reviewed. Allergies and medications reviewed and updated.  Review of Systems  Constitutional: Negative for chills and fever.  HENT: Positive for congestion, postnasal drip, sinus pressure, sneezing and sore throat. Negative for ear discharge, ear pain and rhinorrhea.   Eyes: Negative for pain, redness and visual disturbance.  Respiratory: Positive for cough and wheezing. Negative for chest tightness and shortness of breath.   Cardiovascular: Negative for chest pain and leg swelling.  Genitourinary: Negative for difficulty urinating and dysuria.  Musculoskeletal: Negative for back pain and gait problem.  Skin: Negative for rash.  Neurological: Negative for light-headedness and headaches.  Psychiatric/Behavioral: Negative for agitation and behavioral problems.  All other systems reviewed and are negative.   Per HPI unless specifically indicated above   Objective:   BP 114/75   Pulse 74   Temp 98.1 F (36.7 C)   Ht 5\' 5"  (1.651 m)   Wt 150 lb (68 kg)   SpO2 97%   BMI 24.96 kg/m   Wt Readings from  Last 3 Encounters:  09/21/19 150 lb (68 kg)  09/12/19 150 lb (68 kg)  06/29/19 152 lb 3.2 oz (69 kg)    Physical Exam Vitals reviewed.  Constitutional:      General: She is not in acute distress.    Appearance: She is well-developed. She is not diaphoretic.  HENT:     Right Ear: Tympanic membrane, ear canal and external ear normal.     Left Ear: Tympanic membrane, ear canal and external ear normal.     Nose: Mucosal edema and rhinorrhea present.     Right Sinus: No maxillary sinus tenderness or frontal sinus tenderness.     Left Sinus: No maxillary sinus tenderness or frontal sinus tenderness.     Mouth/Throat:     Pharynx: Uvula midline. No oropharyngeal exudate or posterior oropharyngeal erythema.     Tonsils: No tonsillar abscesses.  Eyes:     Conjunctiva/sclera: Conjunctivae normal.  Cardiovascular:     Rate and Rhythm: Normal rate and regular rhythm.     Heart sounds: Normal heart sounds. No murmur heard.   Pulmonary:     Effort: Pulmonary effort is normal. No respiratory distress.     Breath sounds: Rhonchi present. No wheezing or rales.  Chest:     Chest wall: No tenderness.  Musculoskeletal:        General: No tenderness. Normal range of motion.  Skin:    General:  Skin is warm and dry.     Findings: No rash.  Neurological:     Mental Status: She is alert and oriented to person, place, and time.     Coordination: Coordination normal.  Psychiatric:        Behavior: Behavior normal.       Assessment & Plan:   Problem List Items Addressed This Visit    None    Visit Diagnoses    COPD exacerbation ([place])    -  Primary   Relevant Medications   cefTRIAXone (ROCEPHIN) injection 1 g   methylPREDNISolone acetate (DEPO-MEDROL) injection 80 mg   albuterol (VENTOLIN HFA) 108 (90 Base) MCG/ACT inhaler   cefdinir (OMNICEF) 300 MG capsule   Viral URI       Relevant Medications   cefTRIAXone (ROCEPHIN) injection 1 g   cefdinir (OMNICEF) 300 MG capsule   Other  Relevant Orders   Novel Coronavirus, NAA ([facility])   Loss of taste       Relevant Medications   cefTRIAXone (ROCEPHIN) injection 1 g   methylPREDNISolone acetate (DEPO-MEDROL) injection 80 mg   albuterol (VENTOLIN HFA) 108 (90 Base) MCG/ACT inhaler   cefdinir (OMNICEF) 300 MG capsule      Will treat like COPD exacerbation, will do Rocephin and Depo-Medrol and send in albuterol inhaler and start today. Covid testing pending, recommended quarantine until symptoms are relieved. Follow up plan: Return if symptoms worsen or fail to improve.  Counseling provided for all of the vaccine components Orders Placed This Encounter  Procedures  . Novel Coronavirus, NAA ([facility])    Caryl Pina, MD [facility] 09/21/2019, 4:47 PM

## 2019-09-23 ENCOUNTER — Other Ambulatory Visit: Payer: Self-pay | Admitting: Family Medicine

## 2019-09-23 DIAGNOSIS — R059 Cough, unspecified: Secondary | ICD-10-CM

## 2019-09-23 LAB — SARS-COV-2, NAA 2 DAY TAT

## 2019-09-23 LAB — NOVEL CORONAVIRUS, NAA: SARS-CoV-2, NAA: NOT DETECTED

## 2019-09-26 ENCOUNTER — Telehealth: Payer: Self-pay | Admitting: *Deleted

## 2019-09-26 NOTE — Telephone Encounter (Signed)
Why is she taking it?

## 2019-09-26 NOTE — Telephone Encounter (Signed)
Pt has procedure on 09/29/2019.  She said that she has been put on antibiotic (Cefdinir) since we last seen her.  She started on 09/21/19.  Only prescribed #20 tablets.  Pt wants to know if ok to have procedure.

## 2019-09-27 NOTE — Telephone Encounter (Addendum)
Spoke with patient. 8/4 prescribed Cefdinir by PCP in setting of concern for COPD exacerbation, URI.  Covid negative. She is doing well now. Denies cough, shortness of breath, feels back to baseline. Afebrile. Continue with plans unless clinical status changes.

## 2019-09-27 NOTE — Telephone Encounter (Signed)
Spoke to pt and she took antibiotic for upper respiratory infection.  Negative Covid test.

## 2019-09-28 ENCOUNTER — Other Ambulatory Visit: Payer: Self-pay

## 2019-09-28 ENCOUNTER — Other Ambulatory Visit (HOSPITAL_COMMUNITY)
Admission: RE | Admit: 2019-09-28 | Discharge: 2019-09-28 | Disposition: A | Discharge status: Home / Self Care | Payer: BC Managed Care – PPO | Source: Ambulatory Visit | Attending: Internal Medicine | Admitting: Internal Medicine

## 2019-09-28 DIAGNOSIS — Z20822 Contact with and (suspected) exposure to covid-19: Secondary | ICD-10-CM | POA: Insufficient documentation

## 2019-09-28 DIAGNOSIS — Z01812 Encounter for preprocedural laboratory examination: Secondary | ICD-10-CM | POA: Insufficient documentation

## 2019-09-28 LAB — SARS CORONAVIRUS 2 (TAT 6-24 HRS): SARS Coronavirus 2: NEGATIVE

## 2019-09-29 ENCOUNTER — Encounter (HOSPITAL_COMMUNITY)
Admission: RE | Disposition: A | Discharge status: Home / Self Care | Payer: Self-pay | Source: Home / Self Care | Attending: Internal Medicine

## 2019-09-29 ENCOUNTER — Ambulatory Visit (HOSPITAL_COMMUNITY)
Admission: RE | Admit: 2019-09-29 | Discharge: 2019-09-29 | Disposition: A | Discharge status: Home / Self Care | Payer: BC Managed Care – PPO | Attending: Internal Medicine | Admitting: Internal Medicine

## 2019-09-29 ENCOUNTER — Other Ambulatory Visit: Payer: Self-pay

## 2019-09-29 ENCOUNTER — Ambulatory Visit (HOSPITAL_COMMUNITY): Payer: BC Managed Care – PPO | Admitting: Anesthesiology

## 2019-09-29 ENCOUNTER — Encounter (HOSPITAL_COMMUNITY): Payer: Self-pay | Admitting: Internal Medicine

## 2019-09-29 DIAGNOSIS — K219 Gastro-esophageal reflux disease without esophagitis: Secondary | ICD-10-CM | POA: Insufficient documentation

## 2019-09-29 DIAGNOSIS — F17210 Nicotine dependence, cigarettes, uncomplicated: Secondary | ICD-10-CM | POA: Diagnosis not present

## 2019-09-29 DIAGNOSIS — Z880 Allergy status to penicillin: Secondary | ICD-10-CM | POA: Diagnosis not present

## 2019-09-29 DIAGNOSIS — Z882 Allergy status to sulfonamides status: Secondary | ICD-10-CM | POA: Diagnosis not present

## 2019-09-29 DIAGNOSIS — D124 Benign neoplasm of descending colon: Secondary | ICD-10-CM | POA: Insufficient documentation

## 2019-09-29 DIAGNOSIS — Z86010 Personal history of colonic polyps: Secondary | ICD-10-CM | POA: Insufficient documentation

## 2019-09-29 DIAGNOSIS — Z1211 Encounter for screening for malignant neoplasm of colon: Secondary | ICD-10-CM | POA: Insufficient documentation

## 2019-09-29 DIAGNOSIS — K635 Polyp of colon: Secondary | ICD-10-CM | POA: Diagnosis not present

## 2019-09-29 DIAGNOSIS — Z79899 Other long term (current) drug therapy: Secondary | ICD-10-CM | POA: Insufficient documentation

## 2019-09-29 HISTORY — PX: COLONOSCOPY WITH PROPOFOL: SHX5780

## 2019-09-29 HISTORY — PX: POLYPECTOMY: SHX5525

## 2019-09-29 SURGERY — COLONOSCOPY WITH PROPOFOL
Anesthesia: General

## 2019-09-29 MED ORDER — STERILE WATER FOR IRRIGATION IR SOLN
Status: DC | PRN
  Administered 2019-09-29: 1.5 mL

## 2019-09-29 MED ORDER — PROPOFOL 500 MG/50ML IV EMUL
INTRAVENOUS | Status: DC | PRN
  Administered 2019-09-29: 150 ug/kg/min via INTRAVENOUS
  Administered 2019-09-29: 100 mg via INTRAVENOUS
  Administered 2019-09-29: 40 mg via INTRAVENOUS

## 2019-09-29 MED ORDER — CHLORHEXIDINE GLUCONATE CLOTH 2 % EX PADS: 6.0000 | MEDICATED_PAD | Freq: Once | CUTANEOUS | Status: DC

## 2019-09-29 MED ORDER — LACTATED RINGERS IV SOLN: INTRAVENOUS | Status: DC

## 2019-09-29 NOTE — Anesthesia Preprocedure Evaluation (Signed)
Anesthesia Evaluation  Patient identified by MRN, date of birth, ID band Patient awake    Reviewed: Allergy & Precautions, H&P , NPO status , Patient's Chart, lab work & pertinent test results, reviewed documented beta blocker date and time   Airway Mallampati: I  TM Distance: >3 FB Neck ROM: full    Dental no notable dental hx.    Pulmonary neg pulmonary ROS, Current Smoker,    Pulmonary exam normal breath sounds clear to auscultation       Cardiovascular Exercise Tolerance: Good negative cardio ROS   Rhythm:regular Rate:Normal     Neuro/Psych negative neurological ROS  negative psych ROS   GI/Hepatic Neg liver ROS, GERD  Medicated,  Endo/Other  negative endocrine ROS  Renal/GU negative Renal ROS  negative genitourinary   Musculoskeletal   Abdominal   Peds  Hematology negative hematology ROS (+)   Anesthesia Other Findings   Reproductive/Obstetrics negative OB ROS                             Anesthesia Physical Anesthesia Plan  ASA: II  Anesthesia Plan: General   Post-op Pain Management:    Induction:   PONV Risk Score and Plan: Propofol infusion  Airway Management Planned:   Additional Equipment:   Intra-op Plan:   Post-operative Plan:   Informed Consent: I have reviewed the patients History and Physical, chart, labs and discussed the procedure including the risks, benefits and alternatives for the proposed anesthesia with the patient or authorized representative who has indicated his/her understanding and acceptance.     Dental Advisory Given  Plan Discussed with: CRNA  Anesthesia Plan Comments:         Anesthesia Quick Evaluation

## 2019-09-29 NOTE — Anesthesia Postprocedure Evaluation (Signed)
Anesthesia Post Note  Patient: Carol [name]  Procedure(s) Performed: COLONOSCOPY WITH PROPOFOL (N/A ) POLYPECTOMY  Patient location during evaluation: Endoscopy Anesthesia Type: General Level of consciousness: awake, oriented, awake and alert and patient cooperative Pain management: pain level controlled Vital Signs Assessment: post-procedure vital signs reviewed and stable Respiratory status: spontaneous breathing, nonlabored ventilation and respiratory function stable Cardiovascular status: blood pressure returned to baseline and stable Postop Assessment: no backache and no headache   No complications documented.   Last Vitals:  Vitals:   09/29/19 1234 09/29/19 1235  BP:  131/68  Pulse: 69   Resp:  18  Temp: 37.1 C   SpO2: 97%     Last Pain:  Vitals:   09/29/19 1247  TempSrc:   PainSc: 0-No pain                 Tacy Learn

## 2019-09-29 NOTE — Op Note (Signed)
[facility] Patient Name: Carol [name] Procedure Date: 09/29/2019 12:39 PM MRN: [id number] Date of Birth: 08-07-1960 Attending MD: Norvel Richards , MD CSN: [id number] Age: 59 Admit Type: Outpatient Procedure:                Colonoscopy Indications:              High risk colon cancer surveillance: Personal                            history of colonic polyps Providers:                Norvel Richards, MD, Caprice Kluver, Rosina Lowenstein,                            RN, Aram Candela Referring MD:              Medicines:                Propofol per Anesthesia Complications:            No immediate complications. Estimated Blood Loss:     Estimated blood loss was minimal. Procedure:                Pre-Anesthesia Assessment:                           - Prior to the procedure, a History and Physical                            was performed, and patient medications and                            allergies were reviewed. The patient's tolerance of                            previous anesthesia was also reviewed. The risks                            and benefits of the procedure and the sedation                            options and risks were discussed with the patient.                            All questions were answered, and informed consent                            was obtained. Prior Anticoagulants: The patient has                            taken no previous anticoagulant or antiplatelet                            agents. ASA Grade Assessment: II - A patient with  mild systemic disease. After reviewing the risks                            and benefits, the patient was deemed in                            satisfactory condition to undergo the procedure.                           After obtaining informed consent, the colonoscope                            was passed under direct vision. Throughout the                            procedure, the patient's  blood pressure, pulse, and                            oxygen saturations were monitored continuously. The                            CF-[id number] ([id number]) scope was introduced through                            the anus and advanced to the the cecum, identified                            by appendiceal orifice and ileocecal valve. The                            colonoscopy was performed without difficulty. The                            patient tolerated the procedure well. The quality                            of the bowel preparation was adequate. Scope In: 12:53:43 PM Scope Out: 1:11:41 PM Scope Withdrawal Time: 0 hours 11 minutes 5 seconds  Total Procedure Duration: 0 hours 17 minutes 58 seconds  Findings:      The perianal and digital rectal examinations were normal.      Two semi-pedunculated polyps were found in the descending colon. The       polyps were 5 to 6 mm in size. These polyps were removed with a cold       snare. Resection and retrieval were complete. Estimated blood loss was       minimal.      The exam was otherwise without abnormality on direct and retroflexion       views. Impression:               - Two 5 to 6 mm polyps in the descending colon,                            removed with a cold snare. Resected and retrieved.                           -  The examination was otherwise normal on direct                            and retroflexion views. Moderate Sedation:      Moderate (conscious) sedation was personally administered by an       anesthesia professional. The following parameters were monitored: oxygen       saturation, heart rate, blood pressure, respiratory rate, EKG, adequacy       of pulmonary ventilation, and response to care. Recommendation:           - Patient has a contact number available for                            emergencies. The signs and symptoms of potential                            delayed complications were discussed with the                             patient. Return to normal activities tomorrow.                            Written discharge instructions were provided to the                            patient.                           - Resume previous diet.                           - Continue present medications.                           - Repeat colonoscopy date to be determined after                            pending pathology results are reviewed for                            surveillance.                           - Return to GI office after studies are complete. Procedure Code(s):        --- Professional ---                           [phone], Colonoscopy, flexible; with removal of                            tumor(s), polyp(s), or other lesion(s) by snare                            technique Diagnosis Code(s):        --- Professional ---  Z86.010, Personal history of colonic polyps                           K63.5, Polyp of colon CPT copyright 2019 American Medical Association. All rights reserved. The codes documented in this report are preliminary and upon coder review may  be revised to meet current compliance requirements. Cristopher Estimable. Ashia Dehner, MD Norvel Richards, MD 09/29/2019 1:20:05 PM This report has been signed electronically. Number of Addenda: 0

## 2019-09-29 NOTE — Discharge Instructions (Signed)
Colonoscopy Discharge Instructions  Read the instructions outlined below and refer to this sheet in the next few weeks. These discharge instructions provide you with general information on caring for yourself after you leave the hospital. Your doctor may also give you specific instructions. While your treatment has been planned according to the most current medical practices available, unavoidable complications occasionally occur. If you have any problems or questions after discharge, call Dr. Gala Romney at [phone]. ACTIVITY  You may resume your regular activity, but move at a slower pace for the next 24 hours.   Take frequent rest periods for the next 24 hours.   Walking will help get rid of the air and reduce the bloated feeling in your belly (abdomen).   No driving for 24 hours (because of the medicine (anesthesia) used during the test).    Do not sign any important legal documents or operate any machinery for 24 hours (because of the anesthesia used during the test).  NUTRITION  Drink plenty of fluids.   You may resume your normal diet as instructed by your doctor.   Begin with a light meal and progress to your normal diet. Heavy or fried foods are harder to digest and may make you feel sick to your stomach (nauseated).   Avoid alcoholic beverages for 24 hours or as instructed.  MEDICATIONS  You may resume your normal medications unless your doctor tells you otherwise.  WHAT YOU CAN EXPECT TODAY  Some feelings of bloating in the abdomen.   Passage of more gas than usual.   Spotting of blood in your stool or on the toilet paper.  IF YOU HAD POLYPS REMOVED DURING THE COLONOSCOPY:  No aspirin products for 7 days or as instructed.   No alcohol for 7 days or as instructed.   Eat a soft diet for the next 24 hours.  FINDING OUT THE RESULTS OF YOUR TEST Not all test results are available during your visit. If your test results are not back during the visit, make an appointment  with your caregiver to find out the results. Do not assume everything is normal if you have not heard from your caregiver or the medical facility. It is important for you to follow up on all of your test results.  SEEK IMMEDIATE MEDICAL ATTENTION IF:  You have more than a spotting of blood in your stool.   Your belly is swollen (abdominal distention).   You are nauseated or vomiting.   You have a temperature over 101.   You have abdominal pain or discomfort that is severe or gets worse throughout the day.   Diverticulosis information provided  2 polyps removed your colon today  Diverticulosis  Diverticulosis is a condition that develops when small pouches (diverticula) form in the wall of the large intestine (colon). The colon is where water is absorbed and stool (feces) is formed. The pouches form when the inside layer of the colon pushes through weak spots in the outer layers of the colon. You may have a few pouches or many of them. The pouches usually do not cause problems unless they become inflamed or infected. When this happens, the condition is called diverticulitis. What are the causes? The cause of this condition is not known. What increases the risk? The following factors may make you more likely to develop this condition:  Being older than age 54. Your risk for this condition increases with age. Diverticulosis is rare among people younger than age 38. By age 79, 24  people have it.  Eating a low-fiber diet.  Having frequent constipation.  Being overweight.  Not getting enough exercise.  Smoking.  Taking over-the-counter pain medicines, like aspirin and ibuprofen.  Having a family history of diverticulosis. What are the signs or symptoms? In most people, there are no symptoms of this condition. If you do have symptoms, they may include:  Bloating.  Cramps in the abdomen.  Constipation or diarrhea.  Pain in the lower left side of the abdomen. How is this  diagnosed? Because diverticulosis usually has no symptoms, it is most often diagnosed during an exam for other colon problems. The condition may be diagnosed by:  Using a flexible scope to examine the colon (colonoscopy).  Taking an X-ray of the colon after dye has been put into the colon (barium enema).  Having a CT scan. How is this treated? You may not need treatment for this condition. Your health care provider may recommend treatment to prevent problems. You may need treatment if you have symptoms or if you previously had diverticulitis. Treatment may include:  Eating a high-fiber diet.  Taking a fiber supplement.  Taking a live bacteria supplement (probiotic).  Taking medicine to relax your colon. Follow these instructions at home: Medicines  Take over-the-counter and prescription medicines only as told by your health care provider.  If told by your health care provider, take a fiber supplement or probiotic. Constipation prevention Your condition may cause constipation. To prevent or treat constipation, you may need to:  Drink enough fluid to keep your urine pale yellow.  Take over-the-counter or prescription medicines.  Eat foods that are high in fiber, such as beans, whole grains, and fresh fruits and vegetables.  Limit foods that are high in fat and processed sugars, such as fried or sweet foods.  General instructions  Try not to strain when you have a bowel movement.  Keep all follow-up visits as told by your health care provider. This is important. Contact a health care provider if you:  Have pain in your abdomen.  Have bloating.  Have cramps.  Have not had a bowel movement in 3 days. Get help right away if:  Your pain gets worse.  Your bloating becomes very bad.  You have a fever or chills, and your symptoms suddenly get worse.  You vomit.  You have bowel movements that are bloody or black.  You have bleeding from your  rectum. Summary  Diverticulosis is a condition that develops when small pouches (diverticula) form in the wall of the large intestine (colon).  You may have a few pouches or many of them.  This condition is most often diagnosed during an exam for other colon problems.  Treatment may include increasing the fiber in your diet, taking supplements, or taking medicines. This information is not intended to replace advice given to you by your health care provider. Make sure you discuss any questions you have with your health care provider. Document Revised: 09/02/2018 Document Reviewed: 09/02/2018 Elsevier Patient Education  [employer].  Further recommendations to follow pending review of pathology report  At patient request, I called Dolores Ewing at [phone] left a message on voicemail regarding results.

## 2019-09-29 NOTE — Transfer of Care (Signed)
Immediate Anesthesia Transfer of Care Note  Patient: Carol [name]  Procedure(s) Performed: COLONOSCOPY WITH PROPOFOL (N/A ) POLYPECTOMY  Patient Location: Endoscopy Unit  Anesthesia Type:General  Level of Consciousness: awake, alert , oriented and patient cooperative  Airway & Oxygen Therapy: Patient Spontanous Breathing and Patient connected to nasal cannula oxygen  Post-op Assessment: Report given to RN, Post -op Vital signs reviewed and stable and Patient moving all extremities  Post vital signs: Reviewed and stable  Last Vitals:  Vitals Value Taken Time  BP    Temp    Pulse    Resp    SpO2      Last Pain:  Vitals:   09/29/19 1247  TempSrc:   PainSc: 0-No pain      Patients Stated Pain Goal: 7 (11/64/35 3912)  Complications: No complications documented.

## 2019-09-29 NOTE — H&P (Signed)
@LOGO @   Primary Care Physician:  Claretta Fraise, MD Primary Gastroenterologist:  Dr. Gala Romney  Pre-Procedure History & Physical: HPI:  Carol [name] is a 59 y.o. female here for surveillance colonoscopy.  History of colonic adenoma removed 2016.  No GI symptoms currently.  Past Medical History:  Diagnosis Date  . Chronic nausea   . Diverticula of colon   . GERD (gastroesophageal reflux disease)   . History of kidney stones   . Ovarian cyst     Past Surgical History:  Procedure Laterality Date  . APPENDECTOMY  2010  . BREAST LUMPECTOMY     papilloma per pathology  . CHOLECYSTECTOMY  1997  . COLONOSCOPY  2010   repeat in 5 years; family hx colon ca and polypectomy  . COLONOSCOPY N/A 03/09/2014   Dr. Gala Romney: tubular adenoma, pancolonic diverticulosis. Surveillance due in 2021.   . ESOPHAGOGASTRODUODENOSCOPY N/A 03/09/2014   Dr. Gala Romney: non-critical Schatzki's ring, not manipulated, mild erosive reflux esophagitis, gastric and duodenal erosions with negative H.pylori  . KIDNEY STONE SURGERY    . SHOULDER SURGERY Right   . TUBAL LIGATION      Prior to Admission medications   Medication Sig Start Date End Date Taking? Authorizing Provider  acetaminophen (TYLENOL) 500 MG tablet Take 1,000 mg by mouth as needed for moderate pain.    Yes [provider]  albuterol (VENTOLIN HFA) 108 (90 Base) MCG/ACT inhaler Inhale 2 puffs into the lungs every 6 (six) hours as needed for wheezing or shortness of breath. 09/21/19  Yes Dettinger, Fransisca Kaufmann, MD  azelastine (ASTELIN) 0.1 % nasal spray Place 1 spray into both nostrils 2 (two) times daily. For nasal drainage 09/13/19  Yes Gottschalk, Leatrice Jewels M, DO  cefdinir (OMNICEF) 300 MG capsule Take 1 capsule (300 mg total) by mouth 2 (two) times daily. 1 po BID 09/21/19  Yes Dettinger, Fransisca Kaufmann, MD  promethazine (PHENERGAN) 25 MG tablet Take 0.5 tablets (12.5 mg total) by mouth every 6 (six) hours as needed for nausea or vomiting. 06/29/19  Yes Annitta Needs,  NP  promethazine-dextromethorphan (PROMETHAZINE-DM) 6.25-15 MG/5ML syrup Take 2.5 mLs by mouth 4 (four) times daily as needed for cough. 09/13/19  Yes Gottschalk, Ashly M, DO  fluticasone (FLONASE) 50 MCG/ACT nasal spray Place 1-2 sprays into both nostrils daily as needed for allergies or rhinitis.    [provider]  HYDROcodone-acetaminophen (NORCO/VICODIN) 5-325 MG tablet Take 1 tablet by mouth every 12 (twelve) hours as needed for moderate pain. 06/16/19   Marybelle Killings, MD  pantoprazole (PROTONIX) 40 MG tablet Take 1 tablet (40 mg total) by mouth daily. Take 30 minutes before breakfast 06/29/19   Annitta Needs, NP    Allergies as of 08/29/2019 - Review Complete 06/29/2019  Allergen Reaction Noted  . Penicillins Shortness Of Breath and Swelling 05/27/2011  . Sulfa antibiotics  02/07/2014    Family History  Problem Relation Age of Onset  . Heart disease Father        Pacemaker  . CAD Mother 60       CABG  . Cancer Mother        Died age 89 of colon CA  . Atrial fibrillation Sister   . Brain cancer Sister        metastatic lung cancer.   . Lung cancer Sister        deceased in her early 53s    Social History   Socioeconomic History  . Marital status: Married  Spouse name: Not on file  . Number of children: 4  . Years of education: Not on file  . Highest education level: Not on file  Occupational History    Employer: [id number]    Comment: deli  Tobacco Use  . Smoking status: Current Every Day Smoker    Packs/day: 1.00    Years: 35.00    Pack years: 35.00    Types: Cigarettes  . Smokeless tobacco: Never Used  . Tobacco comment: Smokes about a pack a day  Vaping Use  . Vaping Use: Never used  Substance and Sexual Activity  . Alcohol use: Yes    Alcohol/week: 0.0 standard drinks    Comment: socially/rarely  . Drug use: No  . Sexual activity: Yes  Other Topics Concern  . Not on file  Social History Narrative   Lives at home with husband   Social  Determinants of Health   Financial Resource Strain:   . Difficulty of Paying Living Expenses:   Food Insecurity:   . Worried About Charity fundraiser in the Last Year:   . Arboriculturist in the Last Year:   Transportation Needs:   . Film/video editor (Medical):   Marland Kitchen Lack of Transportation (Non-Medical):   Physical Activity:   . Days of Exercise per Week:   . Minutes of Exercise per Session:   Stress:   . Feeling of Stress :   Social Connections:   . Frequency of Communication with Friends and Family:   . Frequency of Social Gatherings with Friends and Family:   . Attends Religious Services:   . Active Member of Clubs or Organizations:   . Attends Archivist Meetings:   Marland Kitchen Marital Status:   Intimate Partner Violence:   . Fear of Current or Ex-Partner:   . Emotionally Abused:   Marland Kitchen Physically Abused:   . Sexually Abused:     Review of Systems: See HPI, otherwise negative ROS  Physical Exam: BP 131/68   Pulse 69   Temp 98.7 F (37.1 C) (Oral)   Resp 18   Ht 5\' 5"  (1.651 m)   Wt 67.6 kg   SpO2 97%   BMI 24.79 kg/m  General:   Alert,  Well-developed, well-nourished, pleasant and cooperative in NAD SNeck:  Supple; no masses or thyromegaly. No significant cervical adenopathy. Lungs:  Clear throughout to auscultation.   No wheezes, crackles, or rhonchi. No acute distress. Heart:  Regular rate and rhythm; no murmurs, clicks, rubs,  or gallops. Abdomen: Non-distended, normal bowel sounds.  Soft and nontender without appreciable mass or hepatosplenomegaly.    Impression/Plan: 59 year old lady with a history colonic adenoma; here for surveillance colonoscopy per plan. The risks, benefits, limitations, alternatives and imponderables have been reviewed with the patient. Questions have been answered. All parties are agreeable.      Notice: This dictation was prepared with Dragon dictation along with smaller phrase technology. Any transcriptional errors that result  from this process are unintentional and may not be corrected upon review.

## 2019-09-30 ENCOUNTER — Telehealth: Payer: Self-pay | Admitting: Internal Medicine

## 2019-09-30 ENCOUNTER — Encounter: Payer: Self-pay | Admitting: Internal Medicine

## 2019-09-30 LAB — SURGICAL PATHOLOGY

## 2019-09-30 NOTE — Telephone Encounter (Signed)
FYI Spoke with pt. Pt states that she was on Covid leave from her job through 09/26/2019. Pt had her covid test and procedure with our office on 8/11 and 09/29/19. Pt states that her job told her in order to be out to have her procedure, pt would need to be on medical leave. Pt asked if our would fill out paper work for a medical leave. Pt works at [employer] and wants to return Monday if feeling well. Pt doesn't have paper work on hand needed for medical leave. Pt is aware that our office doesn't do medical leave for a colonoscopy. Pt is aware that we can provide a letter for the dates of the covid test/ TCS. Pt states she will call back because when she gets more info.

## 2019-09-30 NOTE — Telephone Encounter (Signed)
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

## 2019-10-04 ENCOUNTER — Encounter (HOSPITAL_COMMUNITY): Payer: Self-pay | Admitting: Internal Medicine

## 2019-10-05 ENCOUNTER — Other Ambulatory Visit: Payer: Self-pay | Admitting: Family Medicine

## 2019-10-05 DIAGNOSIS — R05 Cough: Secondary | ICD-10-CM

## 2019-10-05 DIAGNOSIS — R059 Cough, unspecified: Secondary | ICD-10-CM

## 2019-10-07 ENCOUNTER — Telehealth: Payer: Self-pay | Admitting: Internal Medicine

## 2019-10-07 NOTE — Telephone Encounter (Signed)
Spoke with pt. 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 I asked pt if a letter could be sent to her employer and she said they are asking for their forms to be filled out. Pt is going to ask her employer to fax paper work again.

## 2019-10-07 NOTE — Telephone Encounter (Signed)
Patient called asking to speak with nurse. [phone]

## 2019-10-07 NOTE — Telephone Encounter (Signed)
Lmom, waiting on a return call.  

## 2019-10-10 NOTE — Telephone Encounter (Signed)
I still haven't received anything from her employer.

## 2020-01-04 ENCOUNTER — Encounter: Payer: Self-pay | Admitting: Family

## 2020-01-04 ENCOUNTER — Ambulatory Visit (INDEPENDENT_AMBULATORY_CARE_PROVIDER_SITE_OTHER): Payer: BC Managed Care – PPO | Admitting: Family

## 2020-01-04 ENCOUNTER — Ambulatory Visit: Payer: BC Managed Care – PPO | Admitting: Gastroenterology

## 2020-01-04 DIAGNOSIS — J441 Chronic obstructive pulmonary disease with (acute) exacerbation: Secondary | ICD-10-CM

## 2020-01-04 MED ORDER — BUDESONIDE-FORMOTEROL FUMARATE 80-4.5 MCG/ACT IN AERO: 2.0000 | INHALATION_SPRAY | Freq: Two times a day (BID) | RESPIRATORY_TRACT | 3 refills | Status: DC

## 2020-01-04 MED ORDER — PREDNISONE 10 MG (21) PO TBPK: ORAL_TABLET | ORAL | 0 refills | Status: DC

## 2020-01-04 MED ORDER — AZITHROMYCIN 250 MG PO TABS: ORAL_TABLET | ORAL | 0 refills | Status: DC

## 2020-01-04 NOTE — Progress Notes (Signed)
Virtual Visit via telephone Note Due to COVID-19 pandemic this visit was conducted virtually. This visit type was conducted due to national recommendations for restrictions regarding the COVID-19 Pandemic (e.g. social distancing, sheltering in place) in an effort to limit this patient's exposure and mitigate transmission in our community. All issues noted in this document were discussed and addressed.  A physical exam was not performed with this format.  I connected with Carol [name] on 01/04/20 at 11:02 AM  by telephone and verified that I am speaking with the correct person using two identifiers. Carol [name] is currently located at [facility] and no one  is currently with her  during visit. The provider, Evelina Dun, FNP is located in their office at time of visit.  I discussed the limitations, risks, security and privacy concerns of performing an evaluation and management service by telephone and the availability of in person appointments. I also discussed with the patient that there may be a patient responsible charge related to this service. The patient expressed understanding and agreed to proceed.   History and Present Illness:  Cough This is a recurrent problem. The current episode started 1 to 4 weeks ago. The problem has been gradually worsening. The problem occurs every few minutes. The cough is productive of purulent sputum. Associated symptoms include headaches, shortness of breath and wheezing. Pertinent negatives include no chills, ear congestion, ear pain, fever, myalgias or sore throat. The symptoms are aggravated by lying down. Risk factors for lung disease include smoking/tobacco exposure. She has tried rest and oral steroids for the symptoms. The treatment provided mild relief. Her past medical history is significant for emphysema.  Nicotine Dependence Presents for follow-up visit. Symptoms are negative for sore throat. Her urge triggers include company of smokers. She smokes 1  pack of cigarettes per day.      Review of Systems  Constitutional: Negative for chills and fever.  HENT: Negative for ear pain and sore throat.   Respiratory: Positive for cough, shortness of breath and wheezing.   Musculoskeletal: Negative for myalgias.  Neurological: Positive for headaches.  All other systems reviewed and are negative.    Observations/Objective: No SOB or distress noted   Assessment and Plan: 1. COPD exacerbation ([place]) Will start Symbicort today as she continues to have COPD exacerbation  If Symbicort is too expensive, will need appt with clinical pharm for medication assistance  Smoking cessation encouraged Start prednisone and zpak Force fluids - budesonide-formoterol (SYMBICORT) 80-4.5 MCG/ACT inhaler; Inhale 2 puffs into the lungs 2 (two) times daily.  Dispense: 1 each; Refill: 3 - predniSONE (STERAPRED UNI-PAK 21 TAB) 10 MG (21) TBPK tablet; Use as directed  Dispense: 21 tablet; Refill: 0 - azithromycin (ZITHROMAX) 250 MG tablet; Take 500 mg once, then 250 mg for four days  Dispense: 6 tablet; Refill: 0     I discussed the assessment and treatment plan with the patient. The patient was provided an opportunity to ask questions and all were answered. The patient agreed with the plan and demonstrated an understanding of the instructions.   The patient was advised to call back or seek an in-person evaluation if the symptoms worsen or if the condition fails to improve as anticipated.  The above assessment and management plan was discussed with the patient. The patient verbalized understanding of and has agreed to the management plan. Patient is aware to call the clinic if symptoms persist or worsen. Patient is aware when to return to the clinic for a  follow-up visit. Patient educated on when it is appropriate to go to the emergency department.   Time call ended:  11:13  I provided 11 minutes of non-face-to-face time during this encounter.    Evelina Dun, FNP

## 2020-02-20 NOTE — Progress Notes (Signed)
Primary Care Physician:  Claretta Fraise, MD  Primary GI: Dr. Gala Romney   Patient Location: Home   Provider Location: [facility] office   Reason for Visit: Follow-up    Persons present on the virtual encounter, with roles: Patient and NP   Total time (minutes) spent on medical discussion: 15 minutes   Due to COVID-19, visit was conducted using virtual method.  Visit was requested by patient.  Virtual Visit via Telephone Note Due to COVID-19, visit is conducted virtually and was requested by patient.   I connected with Carol [name] on 02/21/20 at 10:30 AM EST by telephone and verified that I am speaking with the correct person using two identifiers.   I discussed the limitations, risks, security and privacy concerns of performing an evaluation and management service by telephone and the availability of in person appointments. I also discussed with the patient that there may be a patient responsible charge related to this service. The patient expressed understanding and agreed to proceed.  Chief Complaint  Patient presents with  . Follow-up    Occasional nausea     History of Present Illness: 60 year old female presenting today in follow-up after colonoscopy, with history of adenomas and surveillance due again in 2026. Family history of colon cancer in mother, passing away at age 58. Has chronic nausea. This has been evaluated previously. Normal GES. EGD with erosive esophagitis, gastric and duodenal erosions. Promethazine helped in the past. Would just take 1/2 tablet   Still has nausea occasionally. Takes promethazine just as needed. Takes 1/2 tablets to 1 tablet about twice a week. No abdominal pain. GERD exacerbated if not eating appropriately. Protonix daily as needed.   Looser stool at times, feels like it is diet-related.    Past Medical History:  Diagnosis Date  . Chronic nausea   . Diverticula of colon   . GERD (gastroesophageal reflux disease)   . History of kidney  stones   . Ovarian cyst      Past Surgical History:  Procedure Laterality Date  . APPENDECTOMY  2010  . BREAST LUMPECTOMY     papilloma per pathology  . CHOLECYSTECTOMY  1997  . COLONOSCOPY  2010   repeat in 5 years; family hx colon ca and polypectomy  . COLONOSCOPY N/A 03/09/2014   Dr. Gala Romney: tubular adenoma, pancolonic diverticulosis. Surveillance due in 2021.   Marland Kitchen COLONOSCOPY WITH PROPOFOL N/A 09/29/2019   two semi-pedunculated polyps in descending colon, 5-6 mm in size, s/p resection and removal. Tubular adenomas. Surveillance in 5 years.   . ESOPHAGOGASTRODUODENOSCOPY N/A 03/09/2014   Dr. Gala Romney: non-critical Schatzki's ring, not manipulated, mild erosive reflux esophagitis, gastric and duodenal erosions with negative H.pylori  . KIDNEY STONE SURGERY    . POLYPECTOMY  09/29/2019   Procedure: POLYPECTOMY;  Surgeon: Daneil Dolin, MD;  Location: [facility];  Service: Endoscopy;;  . SHOULDER SURGERY Right   . TUBAL LIGATION       Current Meds  Medication Sig  . acetaminophen (TYLENOL) 500 MG tablet Take 1,000 mg by mouth as needed for moderate pain.   Marland Kitchen albuterol (VENTOLIN HFA) 108 (90 Base) MCG/ACT inhaler Inhale 2 puffs into the lungs every 6 (six) hours as needed for wheezing or shortness of breath. (Patient taking differently: Inhale 2 puffs into the lungs as needed for wheezing or shortness of breath.)  . azelastine (ASTELIN) 0.1 % nasal spray Place 1 spray into both nostrils 2 (two) times daily. For nasal drainage (Patient  taking differently: Place 1 spray into both nostrils as needed. For nasal drainage)  . fluticasone (FLONASE) 50 MCG/ACT nasal spray Place 1-2 sprays into both nostrils daily as needed for allergies or rhinitis.  . pantoprazole (PROTONIX) 40 MG tablet Take 1 tablet (40 mg total) by mouth daily. Take 30 minutes before breakfast (Patient taking differently: Take 40 mg by mouth as needed. Take 30 minutes before breakfast)  . promethazine (PHENERGAN) 25 MG  tablet Take 0.5 tablets (12.5 mg total) by mouth every 6 (six) hours as needed for nausea or vomiting. (Patient taking differently: Take 12.5 mg by mouth as needed for nausea or vomiting.)     Family History  Problem Relation Age of Onset  . Heart disease Father        Pacemaker  . CAD Mother 50       CABG  . Cancer Mother        Died age 73 of colon CA  . Atrial fibrillation Sister   . Brain cancer Sister        metastatic lung cancer.   . Lung cancer Sister        deceased in her early 90s    Social History   Socioeconomic History  . Marital status: Married    Spouse name: Not on file  . Number of children: 4  . Years of education: Not on file  . Highest education level: Not on file  Occupational History    Employer: [id number]    Comment: deli  Tobacco Use  . Smoking status: Current Every Day Smoker    Packs/day: 1.00    Years: 35.00    Pack years: 35.00    Types: Cigarettes  . Smokeless tobacco: Never Used  . Tobacco comment: Smokes about a pack a day  Vaping Use  . Vaping Use: Never used  Substance and Sexual Activity  . Alcohol use: Not Currently    Alcohol/week: 0.0 standard drinks    Comment: socially/rarely  . Drug use: No  . Sexual activity: Yes  Other Topics Concern  . Not on file  Social History Narrative   Lives at home with husband   Social Determinants of Health   Financial Resource Strain: Not on file  Food Insecurity: Not on file  Transportation Needs: Not on file  Physical Activity: Not on file  Stress: Not on file  Social Connections: Not on file       Review of Systems: Gen: Denies fever, chills, anorexia. Denies fatigue, weakness, weight loss.  CV: Denies chest pain, palpitations, syncope, peripheral edema, and claudication. Resp: Denies dyspnea at rest, cough, wheezing, coughing up blood, and pleurisy. GI: see HPI Derm: Denies rash, itching, dry skin Psych: Denies depression, anxiety, memory loss, confusion. No homicidal or  suicidal ideation.  Heme: Denies bruising, bleeding, and enlarged lymph nodes.  Observations/Objective: No distress. Unable to perform physical exam due to telephone encounter. No video available.   Assessment and Plan: Pleasant 60 year old female presenting in follow-up with history of chronic GERD, known esophagitis, nausea with normal GES, history of adenomas with surveillance due in 2026, doing well today.  Overall, chronic GI issues at baseline. Nausea is occasionally and well-managed with as needed promethazine. Continues with Protonix daily as needed as well.  She does note occasional looser stool but attributes to diet. I have asked her to keep a log of this and call if becomes more frequent despite adjusting diet.   Will see her again in 6-8 months.  Follow Up Instructions:    I discussed the assessment and treatment plan with the patient. The patient was provided an opportunity to ask questions and all were answered. The patient agreed with the plan and demonstrated an understanding of the instructions.   The patient was advised to call back or seek an in-person evaluation if the symptoms worsen or if the condition fails to improve as anticipated.  I provided 15 minutes during this telephone encounter.  Gelene Mink, PhD, ANP-BC [facility]

## 2020-02-21 ENCOUNTER — Encounter: Payer: Self-pay | Admitting: Gastroenterology

## 2020-02-21 ENCOUNTER — Telehealth (INDEPENDENT_AMBULATORY_CARE_PROVIDER_SITE_OTHER): Payer: BC Managed Care – PPO | Admitting: Gastroenterology

## 2020-02-21 ENCOUNTER — Telehealth: Payer: Self-pay | Admitting: *Deleted

## 2020-02-21 DIAGNOSIS — K219 Gastro-esophageal reflux disease without esophagitis: Secondary | ICD-10-CM | POA: Diagnosis not present

## 2020-02-21 DIAGNOSIS — R110 Nausea: Secondary | ICD-10-CM

## 2020-02-21 NOTE — Telephone Encounter (Signed)
Carol [name], you are scheduled for a virtual visit with your provider today.  Just as we do with appointments in the office, we must obtain your consent to participate.  Your consent will be active for this visit and any virtual visit you may have with one of our providers in the next 365 days.  If you have a MyChart account, I can also send a copy of this consent to you electronically.  All virtual visits are billed to your insurance company just like a traditional visit in the office.  As this is a virtual visit, video technology does not allow for your provider to perform a traditional examination.  This may limit your provider's ability to fully assess your condition.  If your provider identifies any concerns that need to be evaluated in person or the need to arrange testing such as labs, EKG, etc, we will make arrangements to do so.  Although advances in technology are sophisticated, we cannot ensure that it will always work on either your end or our end.  If the connection with a video visit is poor, we may have to switch to a telephone visit.  With either a video or telephone visit, we are not always able to ensure that we have a secure connection.   I need to obtain your verbal consent now.   Are you willing to proceed with your visit today?

## 2020-02-21 NOTE — Telephone Encounter (Signed)
Pt consented to a virtual visit. 

## 2020-02-21 NOTE — Patient Instructions (Signed)
Continue Protonix as needed.   Please call if you have persistent loose stool despite healthy eating.  Please call with any concerns and we will see you sooner, otherwise, we will see you in 6-8 months!  I enjoyed talking with you again today! As you know, I value our relationship and want to provide genuine, compassionate, and quality care. I welcome your feedback. If you receive a survey regarding your visit,  I greatly appreciate you taking time to fill this out. See you next time!  Gelene Mink, PhD, ANP-BC [facility]

## 2020-03-21 ENCOUNTER — Encounter: Payer: Self-pay | Admitting: Family Medicine

## 2020-03-21 ENCOUNTER — Ambulatory Visit (INDEPENDENT_AMBULATORY_CARE_PROVIDER_SITE_OTHER): Payer: BC Managed Care – PPO

## 2020-03-21 ENCOUNTER — Ambulatory Visit (INDEPENDENT_AMBULATORY_CARE_PROVIDER_SITE_OTHER): Payer: BC Managed Care – PPO | Admitting: Family Medicine

## 2020-03-21 VITALS — BP 126/70 | HR 67 | Temp 97.5°F

## 2020-03-21 DIAGNOSIS — R059 Cough, unspecified: Secondary | ICD-10-CM | POA: Diagnosis not present

## 2020-03-21 DIAGNOSIS — R52 Pain, unspecified: Secondary | ICD-10-CM

## 2020-03-21 LAB — VERITOR FLU A/B WAIVED
Influenza A: NEGATIVE
Influenza B: NEGATIVE

## 2020-03-21 NOTE — Progress Notes (Signed)
Subjective:  Patient ID: Carol [name], female    DOB: 1960/11/15  Age: 60 y.o. MRN: [id number]  CC: Cough   HPI Carol [name] presents for several days of cough diarrhea and nausea.  She also has body aches.  She denies fever however she says she does feel cold a lot.  She has not been short of breath.  The cough has been nonproductive.  Depression screen [facility] 2/9 11/03/2018 05/05/2018 01/08/2018  Decreased Interest 0 0 0  Down, Depressed, Hopeless 0 0 0  PHQ - 2 Score 0 0 0    History Carol [name] has a past medical history of Chronic nausea, Diverticula of colon, GERD (gastroesophageal reflux disease), History of kidney stones, and Ovarian cyst.   She has a past surgical history that includes Cholecystectomy (1997); Appendectomy (2010); Tubal ligation; Kidney stone surgery; Breast lumpectomy; Colonoscopy (2010); Colonoscopy (N/A, 03/09/2014); Esophagogastroduodenoscopy (N/A, 03/09/2014); Shoulder surgery (Right); Colonoscopy with propofol (N/A, 09/29/2019); and polypectomy (09/29/2019).   Her family history includes Atrial fibrillation in her sister; Brain cancer in her sister; CAD (age of onset: 58) in her mother; Cancer in her mother; Heart disease in her father; Lung cancer in her sister.She reports that she has been smoking cigarettes. She has a 35.00 pack-year smoking history. She has never used smokeless tobacco. She reports previous alcohol use. She reports that she does not use drugs.    ROS Review of Systems  Constitutional: Positive for activity change, chills and fatigue. Negative for appetite change, diaphoresis and fever.  HENT: Positive for congestion. Negative for ear pain, hearing loss, postnasal drip, rhinorrhea, sore throat and trouble swallowing.   Respiratory: Positive for cough. Negative for chest tightness and shortness of breath.   Cardiovascular: Negative for chest pain.  Gastrointestinal: Positive for diarrhea and nausea. Negative for abdominal pain.  Skin: Negative for  rash.    Objective:  BP 126/70   Pulse 67   Temp (!) 97.5 F (36.4 C) (Temporal)   SpO2 99% Comment: room air  BP Readings from Last 3 Encounters:  03/21/20 126/70  09/29/19 116/60  09/21/19 114/75    Wt Readings from Last 3 Encounters:  09/29/19 149 lb (67.6 kg)  09/21/19 150 lb (68 kg)  09/12/19 150 lb (68 kg)     Physical Exam    Assessment & Plan:   Delailah was seen today for cough.  Diagnoses and all orders for this visit:  Cough -     Novel Coronavirus, NAA ([facility]) -     Veritor Flu A/B Waived -     DG Chest 2 View; Future  Body aches -     Novel Coronavirus, NAA ([facility]) -     Veritor Flu A/B Waived -     DG Chest 2 View; Future  Other orders -     SARS-COV-2, NAA 2 DAY TAT   CXR: no acute infiltrate, Preliminary reading done by Randell Loop     I am having Carol [name] maintain her acetaminophen, fluticasone, promethazine, pantoprazole, azelastine, and albuterol.  Allergies as of 03/21/2020      Reactions   Penicillins Shortness Of Breath, Swelling   Has patient had a PCN reaction causing immediate rash, facial/tongue/throat swelling, SOB or lightheadedness with hypotension: Yes Has patient had a PCN reaction causing severe rash involving mucus membranes or skin necrosis: No Has patient had a PCN reaction that required hospitalization No Has patient had a PCN reaction occurring within the last 10 years: No If all of  the above answers are "NO", then may proceed with Cephalosporin use.   Sulfa Antibiotics    Nausea, itching, headache      Medication List       Accurate as of March 21, 2020 11:59 PM. If you have any questions, ask your nurse or doctor.        acetaminophen 500 MG tablet Commonly known as: TYLENOL Take 1,000 mg by mouth as needed for moderate pain.   albuterol 108 (90 Base) MCG/ACT inhaler Commonly known as: VENTOLIN HFA Inhale 2 puffs into the lungs every 6 (six) hours as needed for wheezing or shortness of  breath. What changed: when to take this   azelastine 0.1 % nasal spray Commonly known as: ASTELIN Place 1 spray into both nostrils 2 (two) times daily. For nasal drainage What changed:   when to take this  reasons to take this   fluticasone 50 MCG/ACT nasal spray Commonly known as: FLONASE Place 1-2 sprays into both nostrils daily as needed for allergies or rhinitis.   pantoprazole 40 MG tablet Commonly known as: PROTONIX Take 1 tablet (40 mg total) by mouth daily. Take 30 minutes before breakfast What changed:   when to take this  reasons to take this   promethazine 25 MG tablet Commonly known as: PHENERGAN Take 0.5 tablets (12.5 mg total) by mouth every 6 (six) hours as needed for nausea or vomiting. What changed: when to take this        Follow-up: No follow-ups on file.  Carol [name], M.D.

## 2020-03-22 LAB — NOVEL CORONAVIRUS, NAA: SARS-CoV-2, NAA: NOT DETECTED

## 2020-03-22 LAB — SARS-COV-2, NAA 2 DAY TAT

## 2020-03-24 NOTE — Progress Notes (Signed)
Hello Carol [name],  Your lab result is normal and/or stable.Some minor variations that are not significant are commonly marked abnormal, but do not represent any medical problem for you.  Best regards, Payeton Germani, M.D.

## 2020-05-04 ENCOUNTER — Ambulatory Visit: Payer: BC Managed Care – PPO | Admitting: Family Medicine

## 2020-05-04 ENCOUNTER — Encounter: Payer: Self-pay | Admitting: Family Medicine

## 2020-05-04 DIAGNOSIS — A084 Viral intestinal infection, unspecified: Secondary | ICD-10-CM

## 2020-05-04 MED ORDER — ONDANSETRON 4 MG PO TBDP: 4.0000 mg | ORAL_TABLET | Freq: Three times a day (TID) | ORAL | 0 refills | Status: DC | PRN

## 2020-05-04 NOTE — Progress Notes (Signed)
Virtual Visit via Telephone Note  I connected with Carol [name] on 05/04/20 at 8:41 AM by telephone and verified that I am speaking with the correct person using two identifiers. Carol [name] is currently located at home and her husband is currently with her during this visit. The provider, Loman Brooklyn, FNP is located in their home at time of visit.  I discussed the limitations, risks, security and privacy concerns of performing an evaluation and management service by telephone and the availability of in person appointments. I also discussed with the patient that there may be a patient responsible charge related to this service. The patient expressed understanding and agreed to proceed.  Subjective: PCP: Claretta Fraise, MD  Chief Complaint  Patient presents with  . GI Problem   Patient c/o N/V/D and fever that started at 3:30 AM.  She works at [employer] but is not aware of anyone that has been sick around her.  She is trying to sip drinks to stay hydrated.   ROS: Per HPI  Current Outpatient Medications:  .  acetaminophen (TYLENOL) 500 MG tablet, Take 1,000 mg by mouth as needed for moderate pain. , Disp: , Rfl:  .  albuterol (VENTOLIN HFA) 108 (90 Base) MCG/ACT inhaler, Inhale 2 puffs into the lungs every 6 (six) hours as needed for wheezing or shortness of breath. (Patient taking differently: Inhale 2 puffs into the lungs as needed for wheezing or shortness of breath.), Disp: 18 g, Rfl: 0 .  azelastine (ASTELIN) 0.1 % nasal spray, Place 1 spray into both nostrils 2 (two) times daily. For nasal drainage (Patient taking differently: Place 1 spray into both nostrils as needed. For nasal drainage), Disp: 30 mL, Rfl: 12 .  fluticasone (FLONASE) 50 MCG/ACT nasal spray, Place 1-2 sprays into both nostrils daily as needed for allergies or rhinitis., Disp: , Rfl:  .  naproxen (NAPROSYN) 500 MG tablet, Take 500 mg by mouth 2 (two) times daily., Disp: , Rfl:  .  pantoprazole (PROTONIX) 40 MG  tablet, Take 1 tablet (40 mg total) by mouth daily. Take 30 minutes before breakfast (Patient taking differently: Take 40 mg by mouth as needed. Take 30 minutes before breakfast), Disp: 30 tablet, Rfl: 3 .  promethazine (PHENERGAN) 25 MG tablet, Take 0.5 tablets (12.5 mg total) by mouth every 6 (six) hours as needed for nausea or vomiting. (Patient taking differently: Take 12.5 mg by mouth as needed for nausea or vomiting.), Disp: 30 tablet, Rfl: 0  Allergies  Allergen Reactions  . Penicillins Shortness Of Breath and Swelling    Has patient had a PCN reaction causing immediate rash, facial/tongue/throat swelling, SOB or lightheadedness with hypotension: Yes Has patient had a PCN reaction causing severe rash involving mucus membranes or skin necrosis: No Has patient had a PCN reaction that required hospitalization No Has patient had a PCN reaction occurring within the last 10 years: No If all of the above answers are "NO", then may proceed with Cephalosporin use.   . Sulfa Antibiotics     Nausea, itching, headache   Past Medical History:  Diagnosis Date  . Chronic nausea   . Diverticula of colon   . GERD (gastroesophageal reflux disease)   . History of kidney stones   . Ovarian cyst     Observations/Objective: A&O  No respiratory distress or wheezing audible over the phone Mood, judgement, and thought processes all WNL   Assessment and Plan: 1. Viral gastroenteritis Encouraged adequate hydration.  Discussed Pedialyte popsicles  and ice chips.  Advised if she is unable to hydrate herself and is not urinating per her usual, she should go to the ER for IV fluids.  Advised not to stop the diarrhea.  Discussed she may not want to eat very much right now and that is okay as long as she is staying hydrated.  Bland foods when she does eat.  Patient will need forms completed to be out of work today and tomorrow which she will have faxed over to our office. - ondansetron (ZOFRAN ODT) 4 MG  disintegrating tablet; Take 1 tablet (4 mg total) by mouth every 8 (eight) hours as needed for nausea or vomiting.  Dispense: 30 tablet; Refill: 0   Follow Up Instructions:  I discussed the assessment and treatment plan with the patient. The patient was provided an opportunity to ask questions and all were answered. The patient agreed with the plan and demonstrated an understanding of the instructions.   The patient was advised to call back or seek an in-person evaluation if the symptoms worsen or if the condition fails to improve as anticipated.  The above assessment and management plan was discussed with the patient. The patient verbalized understanding of and has agreed to the management plan. Patient is aware to call the clinic if symptoms persist or worsen. Patient is aware when to return to the clinic for a follow-up visit. Patient educated on when it is appropriate to go to the emergency department.   Time call ended: 8:50 AM  I provided 9 minutes of non-face-to-face time during this encounter.  Hendricks Limes, MSN, APRN, FNP-C [facility] 05/04/20

## 2020-05-05 ENCOUNTER — Encounter: Payer: Self-pay | Admitting: Family Medicine

## 2020-08-21 ENCOUNTER — Telehealth (INDEPENDENT_AMBULATORY_CARE_PROVIDER_SITE_OTHER): Payer: BC Managed Care – PPO | Admitting: Gastroenterology

## 2020-08-21 ENCOUNTER — Encounter: Payer: Self-pay | Admitting: Gastroenterology

## 2020-08-21 ENCOUNTER — Other Ambulatory Visit: Payer: Self-pay

## 2020-08-21 DIAGNOSIS — K219 Gastro-esophageal reflux disease without esophagitis: Secondary | ICD-10-CM

## 2020-08-21 DIAGNOSIS — R110 Nausea: Secondary | ICD-10-CM

## 2020-08-21 NOTE — Progress Notes (Signed)
Primary Care Physician:  Claretta Fraise, MD  Primary GI: Dr. Gala Romney   Patient Location: Home   Provider Location: [facility] office   Reason for Visit: Follow-up    Persons present on the virtual encounter, with roles: Patient and NP   Total time (minutes) spent on medical discussion: 10 minutes   Due to COVID-19, visit was conducted using virtual method.  Visit was requested by patient.  Virtual Visit via Telephone Note Due to COVID-19, visit is conducted virtually and was requested by patient.   I connected with Carol [name] on 08/21/20 at 10:30 AM EDT by telephone and verified that I am speaking with the correct person using two identifiers.   I discussed the limitations, risks, security and privacy concerns of performing an evaluation and management service by telephone and the availability of in person appointments. I also discussed with the patient that there may be a patient responsible charge related to this service. The patient expressed understanding and agreed to proceed.  Chief Complaint  Patient presents with   Gastroesophageal Reflux    "Not really"   Nausea    Takes Phenergan-helps     History of Present Illness: RIELEY [name] is a 60 year old female with a personal history of adenomas, family history of coon cancer in mother at age 93, chronic nausea, GERD, presenting for follow-up. Surveillance colonoscopy due again in 2026. Normal GES. EGD with erosive esophagitis, gastric and duodenal erosions. Promethazine helped in the past.   Husband had a major stroke several weeks ago. She has no abdominal pain. Nausea is rare and controlled well with promethazine just as needed. No significant GERD exacerbations. She has no dysphagia. Takes pantoprazole prn. No overt GI bleeding.   Past Medical History:  Diagnosis Date   Chronic nausea    Diverticula of colon    GERD (gastroesophageal reflux disease)    History of kidney stones    Ovarian cyst      Past Surgical  History:  Procedure Laterality Date   APPENDECTOMY  2010   BREAST LUMPECTOMY     papilloma per pathology   CHOLECYSTECTOMY  1997   COLONOSCOPY  2010   repeat in 5 years; family hx colon ca and polypectomy   COLONOSCOPY N/A 03/09/2014   Dr. Gala Romney: tubular adenoma, pancolonic diverticulosis. Surveillance due in 2021.    COLONOSCOPY WITH PROPOFOL N/A 09/29/2019   two semi-pedunculated polyps in descending colon, 5-6 mm in size, s/p resection and removal. Tubular adenomas. Surveillance in 5 years.    ESOPHAGOGASTRODUODENOSCOPY N/A 03/09/2014   Dr. Gala Romney: non-critical Schatzki's ring, not manipulated, mild erosive reflux esophagitis, gastric and duodenal erosions with negative H.pylori   KIDNEY STONE SURGERY     POLYPECTOMY  09/29/2019   Procedure: POLYPECTOMY;  Surgeon: Daneil Dolin, MD;  Location: [facility] SUITE;  Service: Endoscopy;;   SHOULDER SURGERY Right    TUBAL LIGATION       Current Meds  Medication Sig   acetaminophen (TYLENOL) 500 MG tablet Take 1,000 mg by mouth as needed for moderate pain.    albuterol (VENTOLIN HFA) 108 (90 Base) MCG/ACT inhaler Inhale 2 puffs into the lungs every 6 (six) hours as needed for wheezing or shortness of breath. (Patient taking differently: Inhale 2 puffs into the lungs as needed for wheezing or shortness of breath.)   azelastine (ASTELIN) 0.1 % nasal spray Place 1 spray into both nostrils 2 (two) times daily. For nasal drainage (Patient taking differently: Place 1 spray into  both nostrils as needed. For nasal drainage)   fluticasone (FLONASE) 50 MCG/ACT nasal spray Place 1-2 sprays into both nostrils daily as needed for allergies or rhinitis.   naproxen (NAPROSYN) 500 MG tablet Take 500 mg by mouth as needed.   ondansetron (ZOFRAN ODT) 4 MG disintegrating tablet Take 1 tablet (4 mg total) by mouth every 8 (eight) hours as needed for nausea or vomiting.   pantoprazole (PROTONIX) 40 MG tablet Take 1 tablet (40 mg total) by mouth daily. Take 30 minutes  before breakfast (Patient taking differently: Take 40 mg by mouth as needed. Take 30 minutes before breakfast)   promethazine (PHENERGAN) 25 MG tablet Take 0.5 tablets (12.5 mg total) by mouth every 6 (six) hours as needed for nausea or vomiting. (Patient taking differently: Take 12.5 mg by mouth as needed for nausea or vomiting.)     Family History  Problem Relation Age of Onset   Heart disease Father        Pacemaker   CAD Mother 59       CABG   Cancer Mother        Died age 51 of colon CA   Atrial fibrillation Sister    Brain cancer Sister        metastatic lung cancer.    Lung cancer Sister        deceased in her early 62s    Social History   Socioeconomic History   Marital status: Married    Spouse name: Not on file   Number of children: 4   Years of education: Not on file   Highest education level: Not on file  Occupational History    Employer: [employer]    Comment: deli  Tobacco Use   Smoking status: Every Day    Packs/day: 1.00    Years: 35.00    Pack years: 35.00    Types: Cigarettes   Smokeless tobacco: Never   Tobacco comments:    Smokes about a pack a day  Vaping Use   Vaping Use: Never used  Substance and Sexual Activity   Alcohol use: Yes    Comment: socially/rarely   Drug use: No   Sexual activity: Yes  Other Topics Concern   Not on file  Social History Narrative   Lives at home with husband   Social Determinants of Health   Financial Resource Strain: Not on file  Food Insecurity: Not on file  Transportation Needs: Not on file  Physical Activity: Not on file  Stress: Not on file  Social Connections: Not on file       Review of Systems: Gen: Denies fever, chills, anorexia. Denies fatigue, weakness, weight loss.  CV: Denies chest pain, palpitations, syncope, peripheral edema, and claudication. Resp: Denies dyspnea at rest, cough, wheezing, coughing up blood, and pleurisy. GI: see HPI Derm: Denies rash, itching, dry skin Psych: Denies  depression, anxiety, memory loss, confusion. No homicidal or suicidal ideation.  Heme: Denies bruising, bleeding, and enlarged lymph nodes.  Observations/Objective: No distress. Unable to perform physical exam due to telephone encounter. No video available.   Assessment and Plan: 60 year old female presenting in follow-up with history of GERD, chronic nausea, personal history of adenomas and FH of colon cancer, doing well today from a GI standpoint.  GERD under well control. No dysphagia. Continues with pantoprazole.   Nausea rare and controlled with promethazine sparingly.  As she is doing well, we will see her back in 1 year or sooner if needed.  Follow Up Instructions:    I discussed the assessment and treatment plan with the patient. The patient was provided an opportunity to ask questions and all were answered. The patient agreed with the plan and demonstrated an understanding of the instructions.   The patient was advised to call back or seek an in-person evaluation if the symptoms worsen or if the condition fails to improve as anticipated.  I provided 10 minutes of face-to-face time during this MyChart Video encounter.  Annitta Needs, PhD, ANP-BC [facility]

## 2020-08-21 NOTE — Patient Instructions (Addendum)
We will see you in 1 year or sooner if needed!  Please call with any concerns!  I enjoyed talking with you again today! As you know, I value our relationship and want to provide genuine, compassionate, and quality care. I welcome your feedback. If you receive a survey regarding your visit,  I greatly appreciate you taking time to fill this out. See you next time!  Annitta Needs, PhD, ANP-BC [facility]

## 2021-02-20 ENCOUNTER — Ambulatory Visit: Payer: BC Managed Care – PPO | Admitting: Family Medicine

## 2021-02-20 DIAGNOSIS — Z720 Tobacco use: Secondary | ICD-10-CM | POA: Diagnosis not present

## 2021-02-20 DIAGNOSIS — J069 Acute upper respiratory infection, unspecified: Secondary | ICD-10-CM

## 2021-02-20 DIAGNOSIS — K0889 Other specified disorders of teeth and supporting structures: Secondary | ICD-10-CM

## 2021-02-20 DIAGNOSIS — K529 Noninfective gastroenteritis and colitis, unspecified: Secondary | ICD-10-CM

## 2021-02-20 MED ORDER — ALBUTEROL SULFATE HFA 108 (90 BASE) MCG/ACT IN AERS: 2.0000 | INHALATION_SPRAY | Freq: Four times a day (QID) | RESPIRATORY_TRACT | 0 refills | Status: DC | PRN

## 2021-02-20 MED ORDER — BENZONATATE 200 MG PO CAPS: 200.0000 mg | ORAL_CAPSULE | Freq: Two times a day (BID) | ORAL | 0 refills | Status: DC | PRN

## 2021-02-20 MED ORDER — NAPROXEN 500 MG PO TABS: 500.0000 mg | ORAL_TABLET | Freq: Two times a day (BID) | ORAL | 0 refills | Status: DC | PRN

## 2021-02-20 NOTE — Progress Notes (Signed)
Telephone visit  Subjective: CC: Diarrhea PCP: Claretta Fraise, MD QVZ:DGLO Q Mcwherter is a 61 y.o. female calls for telephone consult today. Patient provides verbal consent for consult held via phone.  Due to COVID-19 pandemic this visit was conducted virtually. This visit type was conducted due to national recommendations for restrictions regarding the COVID-19 Pandemic (e.g. social distancing, sheltering in place) in an effort to limit this patient's exposure and mitigate transmission in our community. All issues noted in this document were discussed and addressed.  A physical exam was not performed with this format.   Location of patient: home Location of provider: [facility] Others present for call: none  1. Diarrhea/ headaches Patient reports that she has been feeling poorly for 2 days.  She has had scratchy throat, headache and cough.  NO hemoptysis. She reports sneezing.  She reports mild wheezing but no shortness of breath.  She is using tylenol.  She has had multiple loose stools.  She did have some BRBPR from the wiping.  She reports a sore bum. No nausea, vomiting.  2. Toothache Patient reports dental pain on the bottom that is worse at night.  Denies facial swelling.  She has been using topicals and tylenol.  Does not have a dentist.  Denies purulence from tooth.   ROS: Per HPI  Allergies  Allergen Reactions   Penicillins Shortness Of Breath and Swelling    Has patient had a PCN reaction causing immediate rash, facial/tongue/throat swelling, SOB or lightheadedness with hypotension: Yes Has patient had a PCN reaction causing severe rash involving mucus membranes or skin necrosis: No Has patient had a PCN reaction that required hospitalization No Has patient had a PCN reaction occurring within the last 10 years: No If all of the above answers are "NO", then may proceed with Cephalosporin use.    Sulfa Antibiotics     Nausea, itching, headache   Past Medical History:  Diagnosis  Date   Chronic nausea    Diverticula of colon    GERD (gastroesophageal reflux disease)    History of kidney stones    Ovarian cyst     Current Outpatient Medications:    acetaminophen (TYLENOL) 500 MG tablet, Take 1,000 mg by mouth as needed for moderate pain. , Disp: , Rfl:    albuterol (VENTOLIN HFA) 108 (90 Base) MCG/ACT inhaler, Inhale 2 puffs into the lungs every 6 (six) hours as needed for wheezing or shortness of breath. (Patient taking differently: Inhale 2 puffs into the lungs as needed for wheezing or shortness of breath.), Disp: 18 g, Rfl: 0   azelastine (ASTELIN) 0.1 % nasal spray, Place 1 spray into both nostrils 2 (two) times daily. For nasal drainage (Patient taking differently: Place 1 spray into both nostrils as needed. For nasal drainage), Disp: 30 mL, Rfl: 12   fluticasone (FLONASE) 50 MCG/ACT nasal spray, Place 1-2 sprays into both nostrils daily as needed for allergies or rhinitis., Disp: , Rfl:    naproxen (NAPROSYN) 500 MG tablet, Take 500 mg by mouth as needed., Disp: , Rfl:    ondansetron (ZOFRAN ODT) 4 MG disintegrating tablet, Take 1 tablet (4 mg total) by mouth every 8 (eight) hours as needed for nausea or vomiting., Disp: 30 tablet, Rfl: 0   pantoprazole (PROTONIX) 40 MG tablet, Take 1 tablet (40 mg total) by mouth daily. Take 30 minutes before breakfast (Patient taking differently: Take 40 mg by mouth as needed. Take 30 minutes before breakfast), Disp: 30 tablet, Rfl: 3   promethazine (  PHENERGAN) 25 MG tablet, Take 0.5 tablets (12.5 mg total) by mouth every 6 (six) hours as needed for nausea or vomiting. (Patient taking differently: Take 12.5 mg by mouth as needed for nausea or vomiting.), Disp: 30 tablet, Rfl: 0  Assessment/ Plan: 61 y.o. female   URI with cough and congestion - Plan: naproxen (NAPROSYN) 500 MG tablet, albuterol (VENTOLIN HFA) 108 (90 Base) MCG/ACT inhaler, benzonatate (TESSALON) 200 MG capsule  Gastroenteritis  Tobacco use  Toothache -  Plan: naproxen (NAPROSYN) 500 MG tablet  Suspect viral mediated URI and GI illness.  Tessalon Perles for cough.  Naprosyn if needed for headache and/or tooth ache.  Ventolin renewed. ?  Undiagnosed COPD given ongoing tobacco use.  No signs or symptoms so far of bacterial infection but we discussed what the symptoms are and reasons for reevaluation.  She will self COVID test and if positive she will contact me.  Reinforced hydration.  Imodium only if unable to keep up with fluid balance.  It does not sound that she has an infected tooth but it certainly sounds like she would benefit from a dental evaluation and possible removal of tooth.  Naprosyn as needed pain.  Start time: 1:18pm End time: 1:27pm  Total time spent on patient care (including telephone call/ virtual visit): 9 minutes  [place], [place] [phone]

## 2021-02-20 NOTE — Patient Instructions (Signed)

## 2021-06-18 ENCOUNTER — Encounter: Payer: Self-pay | Admitting: Nurse Practitioner

## 2021-06-18 ENCOUNTER — Ambulatory Visit: Payer: BC Managed Care – PPO | Admitting: Nurse Practitioner

## 2021-06-18 DIAGNOSIS — R300 Dysuria: Secondary | ICD-10-CM | POA: Diagnosis not present

## 2021-06-18 LAB — URINALYSIS, COMPLETE
Bilirubin, UA: NEGATIVE
Glucose, UA: NEGATIVE
Ketones, UA: NEGATIVE
Leukocytes,UA: NEGATIVE
Nitrite, UA: NEGATIVE
Protein,UA: NEGATIVE
Specific Gravity, UA: 1.015 (ref 1.005–1.030)
Urobilinogen, Ur: 0.2 mg/dL (ref 0.2–1.0)
pH, UA: 6.5 (ref 5.0–7.5)

## 2021-06-18 LAB — MICROSCOPIC EXAMINATION: Renal Epithel, UA: NONE SEEN /hpf

## 2021-06-18 MED ORDER — NITROFURANTOIN MONOHYD MACRO 100 MG PO CAPS: 100.0000 mg | ORAL_CAPSULE | Freq: Two times a day (BID) | ORAL | 0 refills | Status: DC

## 2021-06-18 MED ORDER — PHENAZOPYRIDINE HCL 95 MG PO TABS: 95.0000 mg | ORAL_TABLET | Freq: Three times a day (TID) | ORAL | 0 refills | Status: DC | PRN

## 2021-06-18 NOTE — Patient Instructions (Signed)
Urinary Tract Infection, Adult A urinary tract infection (UTI) is an infection of any part of the urinary tract. The urinary tract includes: The kidneys. The ureters. The bladder. The urethra. These organs make, store, and get rid of pee (urine) in the body. What are the causes? This infection is caused by germs (bacteria) in your genital area. These germs grow and cause swelling (inflammation) of your urinary tract. What increases the risk? The following factors may make you more likely to develop this condition: Using a small, thin tube (catheter) to drain pee. Not being able to control when you pee or poop (incontinence). Being female. If you are female, these things can increase the risk: Using these methods to prevent pregnancy: A medicine that kills sperm (spermicide). A device that blocks sperm (diaphragm). Having low levels of a female hormone (estrogen). Being pregnant. You are more likely to develop this condition if: You have genes that add to your risk. You are sexually active. You take antibiotic medicines. You have trouble peeing because of: A prostate that is bigger than normal, if you are female. A blockage in the part of your body that drains pee from the bladder. A kidney stone. A nerve condition that affects your bladder. Not getting enough to drink. Not peeing often enough. You have other conditions, such as: Diabetes. A weak disease-fighting system (immune system). Sickle cell disease. Gout. Injury of the spine. What are the signs or symptoms? Symptoms of this condition include: Needing to pee right away. Peeing small amounts often. Pain or burning when peeing. Blood in the pee. Pee that smells bad or not like normal. Trouble peeing. Pee that is cloudy. Fluid coming from the vagina, if you are female. Pain in the belly or lower back. Other symptoms include: Vomiting. Not feeling hungry. Feeling mixed up (confused). This may be the first symptom in  older adults. Being tired and grouchy (irritable). A fever. Watery poop (diarrhea). How is this treated? Taking antibiotic medicine. Taking other medicines. Drinking enough water. In some cases, you may need to see a specialist. Follow these instructions at home:  Medicines Take over-the-counter and prescription medicines only as told by your doctor. If you were prescribed an antibiotic medicine, take it as told by your doctor. Do not stop taking it even if you start to feel better. General instructions Make sure you: Pee until your bladder is empty. Do not hold pee for a long time. Empty your bladder after sex. Wipe from front to back after peeing or pooping if you are a female. Use each tissue one time when you wipe. Drink enough fluid to keep your pee pale yellow. Keep all follow-up visits. Contact a doctor if: You do not get better after 1-2 days. Your symptoms go away and then come back. Get help right away if: You have very bad back pain. You have very bad pain in your lower belly. You have a fever. You have chills. You feeling like you will vomit or you vomit. Summary A urinary tract infection (UTI) is an infection of any part of the urinary tract. This condition is caused by germs in your genital area. There are many risk factors for a UTI. Treatment includes antibiotic medicines. Drink enough fluid to keep your pee pale yellow. This information is not intended to replace advice given to you by your health care provider. Make sure you discuss any questions you have with your health care provider. Document Revised: 09/16/2019 Document Reviewed: 09/16/2019 Elsevier Patient Education    2023 Elsevier Inc.  

## 2021-06-18 NOTE — Progress Notes (Signed)
Patient ? ?Acute Office Visit ? ?Subjective:  ? ?  ?Patient ID: Carol [name], female    DOB: 12-01-1960, 61 y.o.   MRN: [id number] ? ?Chief Complaint  ?Patient presents with  ? Urinary Frequency  ? Urinary Tract Infection  ?  Burning , fever off and on , foul smell with urine   ? ? ?Urinary Frequency  ?This is a new problem. The current episode started 1 to 4 weeks ago. The problem has been unchanged. The pain is at a severity of 6/10. The pain is moderate. There has been no fever. Associated symptoms include frequency and urgency. Pertinent negatives include no chills or flank pain. She has tried nothing for the symptoms. Her past medical history is significant for kidney stones.  ?Urinary Tract Infection  ?The quality of the pain is described as aching and burning. The pain is moderate. There has been no fever. Associated symptoms include frequency and urgency. Pertinent negatives include no chills or flank pain. She has tried NSAIDs for the symptoms. The treatment provided mild relief. Her past medical history is significant for kidney stones.  ? ? ?Review of Systems  ?Constitutional: Negative.  Negative for chills.  ?HENT: Negative.    ?Eyes: Negative.   ?Respiratory: Negative.    ?Cardiovascular: Negative.   ?Genitourinary:  Positive for frequency and urgency. Negative for flank pain.  ?Skin: Negative.  Negative for rash.  ?Neurological: Negative.   ?All other systems reviewed and are negative. ? ? ?   ?Objective:  ?  ?BP 112/68 (BP Location: Left Arm, Patient Position: Sitting, Cuff Size: Normal)   Pulse 60   Temp 98.6 ?F (37 ?C) (Oral)   Resp 20   Ht '5\' 5"'$  (1.651 m)   Wt 148 lb 6.4 oz (67.3 kg)   SpO2 97%   BMI 24.70 kg/m?  ?BP Readings from Last 3 Encounters:  ?06/18/21 112/68  ?03/21/20 126/70  ?09/29/19 116/60  ? ?Wt Readings from Last 3 Encounters:  ?06/18/21 148 lb 6.4 oz (67.3 kg)  ?09/29/19 149 lb (67.6 kg)  ?09/21/19 150 lb (68 kg)  ? ?  ? ?Physical Exam ?Vitals and nursing note reviewed.   ?Constitutional:   ?   Appearance: Normal appearance.  ?HENT:  ?   Head: Normocephalic.  ?   Right Ear: External ear normal.  ?   Left Ear: External ear normal.  ?   Nose: Nose normal.  ?   Mouth/Throat:  ?   Mouth: Mucous membranes are moist.  ?   Pharynx: Oropharynx is clear.  ?Eyes:  ?   Conjunctiva/sclera: Conjunctivae normal.  ?Cardiovascular:  ?   Rate and Rhythm: Normal rate and regular rhythm.  ?   Pulses: Normal pulses.  ?   Heart sounds: Normal heart sounds.  ?Pulmonary:  ?   Effort: Pulmonary effort is normal.  ?   Breath sounds: Normal breath sounds.  ?Abdominal:  ?   General: Bowel sounds are normal.  ?   Tenderness: There is abdominal tenderness. There is no right CVA tenderness or left CVA tenderness.  ?Skin: ?   General: Skin is dry.  ?   Findings: No rash.  ?Neurological:  ?   Mental Status: She is alert and oriented to person, place, and time.  ? ? ?No results found for any visits on 06/18/21. ? ? ?   ?Assessment & Plan:  ? ? ?Appears well, in no apparent distress.  Vital signs are normal. The abdomen is soft without tenderness, guarding,  mass, rebound or organomegaly. No CVA tenderness or inguinal adenopathy noted. Urine dipstick shows positive for RBC's.  Micro exam: few+ bacteria.  ?Yeast present ? ?UTI uncomplicated without evidence of pyelonephritis ? ?PLAN: Treatment per orders - also push fluids, may use Pyridium OTC prn. Call or return to clinic prn if these symptoms worsen or fail to improve as anticipated.   ?Problem List Items Addressed This Visit   ?None ?Visit Diagnoses   ? ? Dysuria      ? Relevant Medications  ? phenazopyridine (PYRIDIUM) 95 MG tablet  ? nitrofurantoin, macrocrystal-monohydrate, (MACROBID) 100 MG capsule  ? Other Relevant Orders  ? Urine Culture  ? Urinalysis, Complete  ? ?  ? ? ?Meds ordered this encounter  ?Medications  ? phenazopyridine (PYRIDIUM) 95 MG tablet  ?  Sig: Take 1 tablet (95 mg total) by mouth 3 (three) times daily as needed for pain.  ?  Dispense:   10 tablet  ?  Refill:  0  ?  Order Specific Question:   Supervising Provider  ?  AnswerClaretta Fraise [[id number]]  ? nitrofurantoin, macrocrystal-monohydrate, (MACROBID) 100 MG capsule  ?  Sig: Take 1 capsule (100 mg total) by mouth 2 (two) times daily. 1 po BId  ?  Dispense:  14 capsule  ?  Refill:  0  ?  Order Specific Question:   Supervising Provider  ?  AnswerClaretta Fraise [[id number]]  ? ? ?Return if symptoms worsen or fail to improve. ? ?Ivy Lynn, NP ? ? ?

## 2021-06-19 NOTE — Telephone Encounter (Signed)
Yes only as needed ?

## 2021-06-20 LAB — URINE CULTURE

## 2021-08-15 ENCOUNTER — Encounter: Payer: Self-pay | Admitting: Internal Medicine

## 2021-10-04 ENCOUNTER — Encounter: Payer: Self-pay | Admitting: Family Medicine

## 2021-10-04 ENCOUNTER — Ambulatory Visit: Payer: BC Managed Care – PPO | Admitting: Family Medicine

## 2021-10-04 VITALS — BP 117/76 | HR 59 | Temp 98.3°F | Wt 146.0 lb

## 2021-10-04 DIAGNOSIS — J069 Acute upper respiratory infection, unspecified: Secondary | ICD-10-CM

## 2021-10-04 DIAGNOSIS — R197 Diarrhea, unspecified: Secondary | ICD-10-CM | POA: Diagnosis not present

## 2021-10-04 DIAGNOSIS — H66001 Acute suppurative otitis media without spontaneous rupture of ear drum, right ear: Secondary | ICD-10-CM | POA: Diagnosis not present

## 2021-10-04 LAB — CBC WITH DIFFERENTIAL/PLATELET
Basophils Absolute: 0 10*3/uL (ref 0.0–0.2)
Basos: 1 %
EOS (ABSOLUTE): 0.1 10*3/uL (ref 0.0–0.4)
Eos: 2 %
Hematocrit: 42.8 % (ref 34.0–46.6)
Hemoglobin: 14.4 g/dL (ref 11.1–15.9)
Immature Grans (Abs): 0 10*3/uL (ref 0.0–0.1)
Immature Granulocytes: 0 %
Lymphocytes Absolute: 1.9 10*3/uL (ref 0.7–3.1)
Lymphs: 32 %
MCH: 31 pg (ref 26.6–33.0)
MCHC: 33.6 g/dL (ref 31.5–35.7)
MCV: 92 fL (ref 79–97)
Monocytes Absolute: 0.5 10*3/uL (ref 0.1–0.9)
Monocytes: 8 %
Neutrophils Absolute: 3.4 10*3/uL (ref 1.4–7.0)
Neutrophils: 57 %
Platelets: 268 10*3/uL (ref 150–450)
RBC: 4.65 x10E6/uL (ref 3.77–5.28)
RDW: 11.5 % — ABNORMAL LOW (ref 11.7–15.4)
WBC: 6 10*3/uL (ref 3.4–10.8)

## 2021-10-04 LAB — BMP8+EGFR
BUN/Creatinine Ratio: 18 (ref 9–23)
BUN: 15 mg/dL (ref 6–24)
CO2: 25 mmol/L (ref 20–29)
Calcium: 9.3 mg/dL (ref 8.7–10.2)
Chloride: 100 mmol/L (ref 96–106)
Creatinine, Ser: 0.82 mg/dL (ref 0.57–1.00)
Glucose: 98 mg/dL (ref 70–99)
Potassium: 4.3 mmol/L (ref 3.5–5.2)
Sodium: 139 mmol/L (ref 134–144)
eGFR: 82 mL/min/{1.73_m2} (ref >59)

## 2021-10-04 MED ORDER — CEFDINIR 300 MG PO CAPS: 300.0000 mg | ORAL_CAPSULE | Freq: Two times a day (BID) | ORAL | 0 refills | Status: DC

## 2021-10-04 NOTE — Progress Notes (Signed)
Subjective:  Patient ID: Carol [name], female    DOB: 1960-04-30, 61 y.o.   MRN: [id number]  Patient Care Team: Claretta Fraise, MD as PCP - General (Family Medicine) Danie Binder, MD (Inactive) as Consulting Physician (Gastroenterology)   Chief Complaint:  Headache (Headache, diarrhea, cough, sneezing X2 weeks)   HPI: Carol [name] is a 61 y.o. female presenting on 10/04/2021 for Headache (Headache, diarrhea, cough, sneezing X2 weeks)   Pt presents today with ongoing complaints of headache, cough, congestion, sneezing, and diarrhea for 2 weeks. No fever. She denies recent illness, hospital stay, or antibiotic use. No hematochezia or melena. Does work at [employer] and is exposed to public daily.   Headache  This is a new problem. The current episode started 1 to 4 weeks ago. The problem has been waxing and waning. The pain is located in the Bilateral region. The pain does not radiate. The pain quality is similar to prior headaches. The quality of the pain is described as aching and throbbing. The pain is mild. Associated symptoms include coughing, ear pain and rhinorrhea. Pertinent negatives include no abdominal pain, abnormal behavior, anorexia, back pain, blurred vision, dizziness, drainage, eye pain, eye redness, eye watering, facial sweating, fever, hearing loss, insomnia, loss of balance, muscle aches, nausea, neck pain, numbness, phonophobia, photophobia, scalp tenderness, seizures, sinus pressure, sore throat, swollen glands, tingling, tinnitus, visual change, vomiting, weakness or weight loss. Nothing aggravates the symptoms. She has tried acetaminophen for the symptoms. The treatment provided mild relief.  URI  The current episode started 1 to 4 weeks ago. The problem has been waxing and waning. There has been no fever. Associated symptoms include congestion, coughing, diarrhea, ear pain, headaches, rhinorrhea and sneezing. Pertinent negatives include no abdominal pain, chest pain,  dysuria, joint pain, joint swelling, nausea, neck pain, plugged ear sensation, rash, sinus pain, sore throat, swollen glands, vomiting or wheezing. She has tried acetaminophen for the symptoms. The treatment provided mild relief.  Diarrhea  The current episode started 1 to 4 weeks ago. The problem occurs 2 to 4 times per day. The stool consistency is described as Watery. The patient states that diarrhea does not awaken her from sleep. Associated symptoms include coughing, headaches and a URI. Pertinent negatives include no abdominal pain, arthralgias, bloating, chills, fever, increased  flatus, myalgias, sweats, vomiting or weight loss. Nothing aggravates the symptoms. There are no known risk factors. She has tried nothing for the symptoms.    Relevant past medical, surgical, family, and social history reviewed and updated as indicated.  Allergies and medications reviewed and updated. Data reviewed: Chart in Epic.   Past Medical History:  Diagnosis Date   Chronic nausea    Diverticula of colon    GERD (gastroesophageal reflux disease)    History of kidney stones    Ovarian cyst     Past Surgical History:  Procedure Laterality Date   APPENDECTOMY  2010   BREAST LUMPECTOMY     papilloma per pathology   CHOLECYSTECTOMY  1997   COLONOSCOPY  2010   repeat in 5 years; family hx colon ca and polypectomy   COLONOSCOPY N/A 03/09/2014   Dr. Gala Romney: tubular adenoma, pancolonic diverticulosis. Surveillance due in 2021.    COLONOSCOPY WITH PROPOFOL N/A 09/29/2019   two semi-pedunculated polyps in descending colon, 5-6 mm in size, s/p resection and removal. Tubular adenomas. Surveillance in 5 years.    ESOPHAGOGASTRODUODENOSCOPY N/A 03/09/2014   Dr. Gala Romney: non-critical Schatzki's ring, not manipulated, mild  erosive reflux esophagitis, gastric and duodenal erosions with negative H.pylori   KIDNEY STONE SURGERY     POLYPECTOMY  09/29/2019   Procedure: POLYPECTOMY;  Surgeon: Daneil Dolin, MD;   [facility];  Service: Endoscopy;;   SHOULDER SURGERY Right    TUBAL LIGATION      Social History   Socioeconomic History   Marital status: Married    Spouse name: Not on file   Number of children: 4   Years of education: Not on file   Highest education level: Not on file  Occupational History    Employer: [email or web address]    Comment: deli  Tobacco Use   Smoking status: Every Day    Packs/day: 1.00    Years: 35.00    Total pack years: 35.00    Types: Cigarettes   Smokeless tobacco: Never   Tobacco comments:    Smokes about a pack a day  Vaping Use   Vaping Use: Never used  Substance and Sexual Activity   Alcohol use: Yes    Comment: socially/rarely   Drug use: No   Sexual activity: Yes  Other Topics Concern   Not on file  Social History Narrative   Lives at home with husband   Social Determinants of Health   Financial Resource Strain: Not on file  Food Insecurity: Not on file  Transportation Needs: Not on file  Physical Activity: Not on file  Stress: Not on file  Social Connections: Not on file  Intimate Partner Violence: Not on file    Outpatient Encounter Medications as of 10/04/2021  Medication Sig   acetaminophen (TYLENOL) 500 MG tablet Take 1,000 mg by mouth as needed for moderate pain.    albuterol (VENTOLIN HFA) 108 (90 Base) MCG/ACT inhaler Inhale 2 puffs into the lungs every 6 (six) hours as needed for wheezing or shortness of breath.   azelastine (ASTELIN) 0.1 % nasal spray Place 1 spray into both nostrils 2 (two) times daily. For nasal drainage (Patient taking differently: Place 1 spray into both nostrils as needed. For nasal drainage)   cefdinir (OMNICEF) 300 MG capsule Take 1 capsule (300 mg total) by mouth 2 (two) times daily. 1 po BID   fluticasone (FLONASE) 50 MCG/ACT nasal spray Place 1-2 sprays into both nostrils daily as needed for allergies or rhinitis.   naproxen (NAPROSYN) 500 MG tablet Take 1 tablet (500 mg total) by mouth 2 (two) times  daily as needed for moderate pain or headache.   ondansetron (ZOFRAN ODT) 4 MG disintegrating tablet Take 1 tablet (4 mg total) by mouth every 8 (eight) hours as needed for nausea or vomiting.   promethazine (PHENERGAN) 25 MG tablet Take 0.5 tablets (12.5 mg total) by mouth every 6 (six) hours as needed for nausea or vomiting. (Patient taking differently: Take 12.5 mg by mouth as needed for nausea or vomiting.)   [DISCONTINUED] phenazopyridine (PYRIDIUM) 95 MG tablet Take 1 tablet (95 mg total) by mouth 3 (three) times daily as needed for pain.   [DISCONTINUED] nitrofurantoin, macrocrystal-monohydrate, (MACROBID) 100 MG capsule Take 1 capsule (100 mg total) by mouth 2 (two) times daily. 1 po BId   No facility-administered encounter medications on file as of 10/04/2021.    Allergies  Allergen Reactions   Penicillins Shortness Of Breath, Swelling and Other (See Comments)    Has patient had a PCN reaction causing immediate rash, facial/tongue/throat swelling, SOB or lightheadedness with hypotension: Yes Has patient had a PCN reaction causing severe rash involving mucus  membranes or skin necrosis: No Has patient had a PCN reaction that required hospitalization No Has patient had a PCN reaction occurring within the last 10 years: No If all of the above answers are "NO", then may proceed with Cephalosporin use.  Childhood Has patient had a PCN reaction causing immediate rash, facial/tongue/throat swelling, SOB or lightheadedness with hypotension: Yes Has patient had a PCN reaction causing severe rash involving mucus membranes or skin necrosis: No Has patient had a PCN reaction that required hospitalization No Has patient had a PCN reaction occurring within the last 10 years: No If all of the above answers are "NO", then may proceed with Cephalosporin use.   Sulfa Antibiotics     Nausea, itching, headache Nausea, itching, headache    Review of Systems  Constitutional:  Positive for activity  change and appetite change. Negative for chills, diaphoresis, fatigue, fever, unexpected weight change and weight loss.  HENT:  Positive for congestion, ear pain, rhinorrhea and sneezing. Negative for dental problem, drooling, ear discharge, facial swelling, hearing loss, mouth sores, nosebleeds, postnasal drip, sinus pressure, sinus pain, sore throat, tinnitus, trouble swallowing and voice change.   Eyes:  Negative for blurred vision, photophobia, pain and redness.  Respiratory:  Positive for cough. Negative for apnea, choking, chest tightness, shortness of breath, wheezing and stridor.   Cardiovascular:  Negative for chest pain, palpitations and leg swelling.  Gastrointestinal:  Positive for diarrhea. Negative for abdominal distention, abdominal pain, anal bleeding, anorexia, bloating, blood in stool, constipation, flatus, nausea, rectal pain and vomiting.  Genitourinary:  Negative for decreased urine volume, difficulty urinating and dysuria.  Musculoskeletal:  Negative for arthralgias, back pain, joint pain, myalgias and neck pain.  Skin:  Negative for rash.  Neurological:  Positive for headaches. Negative for dizziness, tingling, tremors, seizures, syncope, facial asymmetry, speech difficulty, weakness, light-headedness, numbness and loss of balance.  Psychiatric/Behavioral:  Negative for confusion. The patient does not have insomnia.   All other systems reviewed and are negative.       Objective:  BP 117/76   Pulse (!) 59   Temp 98.3 F (36.8 C)   Wt 146 lb (66.2 kg)   SpO2 94%   BMI 24.30 kg/m    Wt Readings from Last 3 Encounters:  10/04/21 146 lb (66.2 kg)  06/18/21 148 lb 6.4 oz (67.3 kg)  09/29/19 149 lb (67.6 kg)    Physical Exam Vitals and nursing note reviewed.  Constitutional:      General: She is not in acute distress.    Appearance: Normal appearance. She is well-developed, well-groomed and normal weight. She is not ill-appearing, toxic-appearing or diaphoretic.   HENT:     Head: Normocephalic and atraumatic.     Jaw: There is normal jaw occlusion.     Right Ear: Hearing, ear canal and external ear normal. Tympanic membrane is erythematous and bulging. Tympanic membrane is not perforated.     Left Ear: Hearing, tympanic membrane, ear canal and external ear normal.     Nose: Congestion present.     Right Turbinates: Swollen.     Left Turbinates: Swollen.     Mouth/Throat:     Lips: Pink.     Mouth: Mucous membranes are moist.     Pharynx: Oropharynx is clear. Uvula midline. Posterior oropharyngeal erythema present. No pharyngeal swelling, oropharyngeal exudate or uvula swelling.     Tonsils: No tonsillar exudate or tonsillar abscesses.  Eyes:     General: Lids are normal.  Extraocular Movements: Extraocular movements intact.     Conjunctiva/sclera: Conjunctivae normal.     Pupils: Pupils are equal, round, and reactive to light.  Neck:     Thyroid: No thyroid mass, thyromegaly or thyroid tenderness.     Vascular: No carotid bruit or JVD.     Trachea: Trachea and phonation normal.  Cardiovascular:     Rate and Rhythm: Normal rate and regular rhythm.     Chest Wall: PMI is not displaced.     Pulses: Normal pulses.     Heart sounds: Normal heart sounds. No murmur heard.    No friction rub. No gallop.  Pulmonary:     Effort: Pulmonary effort is normal. No respiratory distress.     Breath sounds: Normal breath sounds. No wheezing.  Abdominal:     General: Bowel sounds are normal. There is no distension or abdominal bruit.     Palpations: Abdomen is soft. There is no hepatomegaly or splenomegaly.     Tenderness: There is no abdominal tenderness. There is no right CVA tenderness or left CVA tenderness.     Hernia: No hernia is present.  Musculoskeletal:        General: Normal range of motion.     Cervical back: Normal range of motion and neck supple.     Right lower leg: No edema.     Left lower leg: No edema.  Lymphadenopathy:      Cervical: No cervical adenopathy.  Skin:    General: Skin is warm and dry.     Capillary Refill: Capillary refill takes less than 2 seconds.     Coloration: Skin is not cyanotic, jaundiced or pale.     Findings: No rash.  Neurological:     General: No focal deficit present.     Mental Status: She is alert and oriented to person, place, and time.     Sensory: Sensation is intact.     Motor: Motor function is intact.     Coordination: Coordination is intact.     Gait: Gait is intact.     Deep Tendon Reflexes: Reflexes are normal and symmetric.  Psychiatric:        Attention and Perception: Attention and perception normal.        Mood and Affect: Mood and affect normal.        Speech: Speech normal.        Behavior: Behavior normal. Behavior is cooperative.        Thought Content: Thought content normal.        Cognition and Memory: Cognition and memory normal.        Judgment: Judgment normal.     Results for orders placed or performed in visit on 06/18/21  Urine Culture   Specimen: Urine   UR  Result Value Ref Range   Urine Culture, Routine Final report    Organism ID, Bacteria Comment   Microscopic Examination   Urine  Result Value Ref Range   WBC, UA 0-5 0 - 5 /hpf   RBC, Urine 0-2 0 - 2 /hpf   Epithelial Cells (non renal) 0-10 0 - 10 /hpf   Renal Epithel, UA None seen None seen /hpf   Bacteria, UA Few (A) None seen/Few   Yeast, UA Present (A) None seen  Urinalysis, Complete  Result Value Ref Range   Specific Gravity, UA 1.015 1.005 - 1.030   pH, UA 6.5 5.0 - 7.5   Color, UA Yellow Yellow   Appearance Ur  Clear Clear   Leukocytes,UA Negative Negative   Protein,UA Negative Negative/Trace   Glucose, UA Negative Negative   Ketones, UA Negative Negative   RBC, UA Trace (A) Negative   Bilirubin, UA Negative Negative   Urobilinogen, Ur 0.2 0.2 - 1.0 mg/dL   Nitrite, UA Negative Negative   Microscopic Examination See below:        Pertinent labs & imaging results  that were available during my care of the patient were reviewed by me and considered in my medical decision making.  Assessment & Plan:  Lorayne was seen today for headache.  Diagnoses and all orders for this visit:  Diarrhea of presumed infectious origin Will obtain stool panel along with labs. Can try imodium. Adequate hydration discussed in detail. Further treatment pending results. Report new, worsening, or persistent symptoms.   -     BMP8+EGFR -     CBC with Differential/Platelet -     Cdiff NAA+O+P+Stool Culture  URI with cough and congestion Past window for antiviral therapy, so testing not completed. Symptomatic care discussed in detail. Report new, worsening, or persistent symptoms.   Non-recurrent acute suppurative otitis media of right ear without spontaneous rupture of tympanic membrane Symptomatic care discussed in detail. Medications as prescribed. Smoking cessation encouraged. Report new, worsening, or persistent symptoms.  -     cefdinir (OMNICEF) 300 MG capsule; Take 1 capsule (300 mg total) by mouth 2 (two) times daily. 1 po BID     Continue all other maintenance medications.  Follow up plan: Return if symptoms worsen or fail to improve.   Continue healthy lifestyle choices, including diet (rich in fruits, vegetables, and lean proteins, and low in salt and simple carbohydrates) and exercise (at least 30 minutes of moderate physical activity daily).  Educational handout given for diarrhea  The above assessment and management plan was discussed with the patient. The patient verbalized understanding of and has agreed to the management plan. Patient is aware to call the clinic if they develop any new symptoms or if symptoms persist or worsen. Patient is aware when to return to the clinic for a follow-up visit. Patient educated on when it is appropriate to go to the emergency department.   Monia Pouch, FNP-C [facility] [phone]

## 2021-10-04 NOTE — Patient Instructions (Signed)
Aleve Cold and Sinus

## 2021-10-08 ENCOUNTER — Other Ambulatory Visit: Payer: BC Managed Care – PPO

## 2021-10-08 DIAGNOSIS — R197 Diarrhea, unspecified: Secondary | ICD-10-CM | POA: Diagnosis not present

## 2021-10-12 LAB — CDIFF NAA+O+P+STOOL CULTURE
E coli, Shiga toxin Assay: NEGATIVE
Toxigenic C. Difficile by PCR: NEGATIVE

## 2021-11-08 DIAGNOSIS — Z6824 Body mass index (BMI) 24.0-24.9, adult: Secondary | ICD-10-CM | POA: Diagnosis not present

## 2021-11-08 DIAGNOSIS — K047 Periapical abscess without sinus: Secondary | ICD-10-CM | POA: Diagnosis not present

## 2022-03-05 DIAGNOSIS — K5792 Diverticulitis of intestine, part unspecified, without perforation or abscess without bleeding: Secondary | ICD-10-CM | POA: Diagnosis not present

## 2022-03-05 DIAGNOSIS — R059 Cough, unspecified: Secondary | ICD-10-CM | POA: Diagnosis not present

## 2022-04-10 ENCOUNTER — Ambulatory Visit (INDEPENDENT_AMBULATORY_CARE_PROVIDER_SITE_OTHER): Payer: BC Managed Care – PPO | Admitting: Orthopaedic Surgery

## 2022-04-10 ENCOUNTER — Encounter: Payer: Self-pay | Admitting: Orthopaedic Surgery

## 2022-04-10 ENCOUNTER — Ambulatory Visit (INDEPENDENT_AMBULATORY_CARE_PROVIDER_SITE_OTHER): Payer: BC Managed Care – PPO

## 2022-04-10 VITALS — Ht 65.0 in | Wt 144.0 lb

## 2022-04-10 DIAGNOSIS — M25511 Pain in right shoulder: Secondary | ICD-10-CM

## 2022-04-10 DIAGNOSIS — M549 Dorsalgia, unspecified: Secondary | ICD-10-CM | POA: Diagnosis not present

## 2022-04-10 DIAGNOSIS — G8929 Other chronic pain: Secondary | ICD-10-CM

## 2022-04-10 NOTE — Progress Notes (Signed)
Office Visit Note   Patient: Carol [name]           Date of Birth: 19-Dec-1960           MRN: [id number] Visit Date: 04/10/2022              Requested by: Claretta Fraise, MD [place],  [place] [address] PCP: Claretta Fraise, MD   Assessment & Plan: Visit Diagnoses:  1. Chronic right shoulder pain   2. Mid back pain     Plan: Right subacromial injection performed if she has persistent problems she will let us know and we can proceed with diagnostic imaging.  We discussed that she may have some rotator cuff tearing.  States she does with injection she will call if she is having persistent problems.  X-ray results were reviewed with her and discussed.  Follow-Up Instructions: No follow-ups on file.   Orders:  Orders Placed This Encounter  Procedures   XR Shoulder Right   XR Thoracic Spine 2 View   No orders of the defined types were placed in this encounter.     Procedures: No procedures performed   Clinical Data: No additional findings.   Subjective: Chief Complaint  Patient presents with   Right Shoulder - Pain    HPI 62 year old female with right shoulder and mid back pain.  Pain has around her shoulder blade she has pain with range of motion of her shoulder.  Had some pain between her shoulder blades was told she had some scoliosis in the past.  She has not gotten relief with Tylenol.  No myelopathic symptoms no fever or chills.  No associated headaches.  She has pain with outstretched reaching pain with overhead activity.  Previous shoulder arthroscopy and biceps tenodesis several years ago.  Review of Systems all systems noncontributory to HPI.   Objective: Vital Signs: Ht '5\' 5"'$  (1.651 m)   Wt 144 lb (65.3 kg)   BMI 23.96 kg/m   Physical Exam Constitutional:      Appearance: She is well-developed.  HENT:     Head: Normocephalic.     Right Ear: External ear normal.     Left Ear: External ear normal. There is no impacted cerumen.  Eyes:      Pupils: Pupils are equal, round, and reactive to light.  Neck:     Thyroid: No thyromegaly.     Trachea: No tracheal deviation.  Cardiovascular:     Rate and Rhythm: Normal rate.  Pulmonary:     Effort: Pulmonary effort is normal.  Abdominal:     Palpations: Abdomen is soft.  Musculoskeletal:     Cervical back: No rigidity.  Skin:    General: Skin is warm and dry.  Neurological:     Mental Status: She is alert and oriented to person, place, and time.  Psychiatric:        Behavior: Behavior normal.     Ortho Exam patient has positive impingement right shoulder pain with resisted supraspinatus testing.  Long head of the biceps minimally tender no brachial plexus tenderness.  Patient has scoliosis with left thoracolumbar curvature pelvis is level.  Weakness with empty can test consistent with likely supraspinatus pathology.  Internal/external rotation is strong.  Specialty Comments:  No specialty comments available.  Imaging: XR Shoulder Right  Result Date: 04/11/2022 Three-view x-rays right shoulder obtained and reviewed this shows previous biceps tenodesis sclerotic postop changes site.  No high riding head no osteoarthritis of the glenohumeral  joint.  Mild acromioclavicular degenerative changes. Impression: Previous biceps tenodesis changes.  No rotator cuff calcification noted.  XR Thoracic Spine 2 View  Result Date: 04/11/2022 [facility] lateral thoracic films are obtained and reviewed displaced down to the L3 level.  A left thoracal lumbar curve approximately 10 degrees is noted.  No thoracic compressions fractures are noted. Impression: Scoliosis, negative for acute compression.    PMFS History: Patient Active Problem List   Diagnosis Date Noted   Impingement syndrome of right shoulder 06/13/2019   GERD (gastroesophageal reflux disease) 05/09/2014   Mucosal abnormality of stomach    Chronic nausea 02/07/2014   Dyspepsia 02/07/2014   History of colonic polyps 02/07/2014    Menopausal state 01/02/2014   Chronic neck pain 08/01/2013   Numbness and tingling of right arm 08/01/2013   Past Medical History:  Diagnosis Date   Chronic nausea    Diverticula of colon    GERD (gastroesophageal reflux disease)    History of kidney stones    Ovarian cyst     Family History  Problem Relation Age of Onset   Heart disease Father        Pacemaker   CAD Mother 6       CABG   Cancer Mother        Died age 62 of colon CA   Atrial fibrillation Sister    Brain cancer Sister        metastatic lung cancer.    Lung cancer Sister        deceased in her early 39s    Past Surgical History:  Procedure Laterality Date   APPENDECTOMY  2010   BREAST LUMPECTOMY     papilloma per pathology   CHOLECYSTECTOMY  1997   COLONOSCOPY  2010   repeat in 5 years; family hx colon ca and polypectomy   COLONOSCOPY N/A 03/09/2014   Dr. Gala Romney: tubular adenoma, pancolonic diverticulosis. Surveillance due in 2021.    COLONOSCOPY WITH PROPOFOL N/A 09/29/2019   two semi-pedunculated polyps in descending colon, 5-6 mm in size, s/p resection and removal. Tubular adenomas. Surveillance in 5 years.    ESOPHAGOGASTRODUODENOSCOPY N/A 03/09/2014   Dr. Gala Romney: non-critical Schatzki's ring, not manipulated, mild erosive reflux esophagitis, gastric and duodenal erosions with negative H.pylori   KIDNEY STONE SURGERY     POLYPECTOMY  09/29/2019   Procedure: POLYPECTOMY;  Surgeon: Daneil Dolin, MD;  Location: [facility];  Service: Endoscopy;;   SHOULDER SURGERY Right    TUBAL LIGATION     Social History   Occupational History    Employer: [id number]    Comment: deli  Tobacco Use   Smoking status: Every Day    Packs/day: 1.00    Years: 35.00    Total pack years: 35.00    Types: Cigarettes   Smokeless tobacco: Never   Tobacco comments:    Smokes about a pack a day  Vaping Use   Vaping Use: Never used  Substance and Sexual Activity   Alcohol use: Yes    Comment: socially/rarely   Drug use:  No   Sexual activity: Yes

## 2022-04-14 ENCOUNTER — Encounter: Payer: Self-pay | Admitting: Orthopaedic Surgery

## 2022-04-17 ENCOUNTER — Encounter: Payer: Self-pay | Admitting: Radiology

## 2022-04-28 NOTE — Patient Instructions (Signed)
Our records indicate that you are due for your annual mammogram/breast imaging. While there is no way to prevent breast cancer, early detection provides the best opportunity for curing it. For women over the age of 40, the American Cancer Society recommends a yearly clinical breast exam and a yearly mammogram. These practices have saved thousands of lives. We need your help to ensure that you are receiving optimal medical care. Please call the imaging location that has done you previous mammograms. Please remember to list us as your primary care. This helps make sure we receive a report and can update your chart.  Below is the contact information for several local breast imaging centers. You may call the location that works best for you, and they will be happy to assistance in making you an appointment. You do not need an order for a regular screening mammogram. However, if you are having any problems or concerns with you breast area, please let your primary care provider know, and appropriate orders will be placed. Please let our office know if you have any questions or concerns. Or if you need information for another imaging center not on this list or outside of the area. We are commented to working with you on your health care journey.   The mobile unit/bus ([facility]) - they come twice a month to our location.  These appointments can be made through our office or by call The Breast Center  [facility]  [address] [place], [place] [address] Phone [phone]  [facility] Radiology Department  [address]  [place], [place] [address] [phone]  [facility])  [address] [place], [place] [address] [phone]  [facility] [place]  [address] [place] [address] [phone]  [facility]  [address], [address] [place] [address] [phone]  [facility] [place]  [address] , [place] [address] [phone]  [facility] [address] [place], [place] [address] [phone]  [facility] [address]  [address] [place], [place] [address] [phone]  [facility] [address] [place] [address] [phone]     

## 2022-04-29 ENCOUNTER — Encounter: Payer: Self-pay | Admitting: Nurse Practitioner

## 2022-04-29 ENCOUNTER — Ambulatory Visit: Payer: BC Managed Care – PPO | Admitting: Nurse Practitioner

## 2022-04-29 VITALS — BP 123/67 | HR 60 | Temp 97.7°F | Ht 65.0 in | Wt 154.0 lb

## 2022-04-29 DIAGNOSIS — J0100 Acute maxillary sinusitis, unspecified: Secondary | ICD-10-CM | POA: Diagnosis not present

## 2022-04-29 DIAGNOSIS — K219 Gastro-esophageal reflux disease without esophagitis: Secondary | ICD-10-CM | POA: Diagnosis not present

## 2022-04-29 MED ORDER — OMEPRAZOLE 20 MG PO CPDR: 20.0000 mg | DELAYED_RELEASE_CAPSULE | Freq: Every day | ORAL | 3 refills | Status: AC

## 2022-04-29 MED ORDER — DOXYCYCLINE HYCLATE 100 MG PO TABS: 100.0000 mg | ORAL_TABLET | Freq: Two times a day (BID) | ORAL | 0 refills | Status: DC

## 2022-04-29 NOTE — Progress Notes (Signed)
Subjective:    Patient ID: Carol [name], female    DOB: [birth year], 62 y.o.   MRN: [id number]   Chief Complaint: uri  URI  This is a new problem. The current episode started 1 to 4 weeks ago. The problem has been gradually worsening. There has been no fever. Associated symptoms include congestion, coughing, headaches, nausea, rhinorrhea and sneezing. Pertinent negatives include no sore throat. She has tried acetaminophen for the symptoms. The treatment provided mild relief.   She also c/o lots of belching and acid reflux. Patient Active Problem List   Diagnosis Date Noted   Impingement syndrome of right shoulder 06/13/2019   GERD (gastroesophageal reflux disease) 05/09/2014   Mucosal abnormality of stomach    Chronic nausea 02/07/2014   Dyspepsia 02/07/2014   History of colonic polyps 02/07/2014   Menopausal state 01/02/2014   Chronic neck pain 08/01/2013   Numbness and tingling of right arm 08/01/2013       Review of Systems  Constitutional:  Negative for chills and fever.  HENT:  Positive for congestion, rhinorrhea and sneezing. Negative for sore throat.   Respiratory:  Positive for cough.   Gastrointestinal:  Positive for nausea.  Neurological:  Positive for headaches.       Objective:   Physical Exam HENT:     Right Ear: Tympanic membrane normal.     Left Ear: Tympanic membrane normal.     Nose: Congestion and rhinorrhea present.     Right Sinus: Maxillary sinus tenderness present.     Left Sinus: Maxillary sinus tenderness present.     Mouth/Throat:     Pharynx: No oropharyngeal exudate or posterior oropharyngeal erythema.  Cardiovascular:     Rate and Rhythm: Normal rate and regular rhythm.     Heart sounds: Normal heart sounds.  Pulmonary:     Effort: Pulmonary effort is normal.     Breath sounds: Normal breath sounds.  Musculoskeletal:     Cervical back: Normal range of motion and neck supple.  Neurological:     Mental Status: She is alert.    BP  123/67   Pulse 60   Temp 97.7 F (36.5 C) (Temporal)   Ht '5\' 5"'$  (1.651 m)   Wt 154 lb (69.9 kg)   SpO2 95%   BMI 25.63 kg/m         Assessment & Plan:  Carol [name] in today with chief complaint of URI   1. Acute non-recurrent maxillary sinusitis 1. Take meds as prescribed 2. Use a cool mist humidifier especially during the winter months and when heat has been humid. 3. Use saline nose sprays frequently 4. Saline irrigations of the nose can be very helpful if done frequently.  * 4X daily for 1 week*  * Use of a nettie pot can be helpful with this. Follow directions with this* 5. Drink plenty of fluids 6. Keep thermostat turn down low 7.For any cough or congestion- delsym OTC 8. For fever or aces or pains- take tylenol or ibuprofen appropriate for age and weight.  * for fevers greater than 101 orally you may alternate ibuprofen and tylenol every  3 hours.    - doxycycline (VIBRA-TABS) 100 MG tablet; Take 1 tablet (100 mg total) by mouth 2 (two) times daily. 1 po bid  Dispense: 20 tablet; Refill: 0  2. Gastroesophageal reflux disease without esophagitis Avoid spicy foods Do not eat 2 hours prior to bedtime  - omeprazole (PRILOSEC) 20 MG capsule; Take 1 capsule (  20 mg total) by mouth daily.  Dispense: 30 capsule; Refill: 3    The above assessment and management plan was discussed with the patient. The patient verbalized understanding of and has agreed to the management plan. Patient is aware to call the clinic if symptoms persist or worsen. Patient is aware when to return to the clinic for a follow-up visit. Patient educated on when it is appropriate to go to the emergency department.   Mary-Margaret Hassell Done, FNP

## 2022-08-28 ENCOUNTER — Ambulatory Visit: Payer: BC Managed Care – PPO | Admitting: Family Medicine

## 2022-08-28 ENCOUNTER — Encounter: Payer: Self-pay | Admitting: Family Medicine

## 2022-08-28 VITALS — BP 136/74 | HR 64 | Temp 97.9°F | Ht 65.0 in | Wt 156.8 lb

## 2022-08-28 DIAGNOSIS — R300 Dysuria: Secondary | ICD-10-CM | POA: Diagnosis not present

## 2022-08-28 DIAGNOSIS — R197 Diarrhea, unspecified: Secondary | ICD-10-CM

## 2022-08-28 DIAGNOSIS — J40 Bronchitis, not specified as acute or chronic: Secondary | ICD-10-CM

## 2022-08-28 DIAGNOSIS — J329 Chronic sinusitis, unspecified: Secondary | ICD-10-CM | POA: Diagnosis not present

## 2022-08-28 LAB — URINALYSIS
Bilirubin, UA: NEGATIVE
Glucose, UA: NEGATIVE
Ketones, UA: NEGATIVE
Leukocytes,UA: NEGATIVE
Nitrite, UA: NEGATIVE
Protein,UA: NEGATIVE
Specific Gravity, UA: 1.015 (ref 1.005–1.030)
Urobilinogen, Ur: 0.2 mg/dL (ref 0.2–1.0)
pH, UA: 7 (ref 5.0–7.5)

## 2022-08-28 MED ORDER — DIPHENOXYLATE-ATROPINE 2.5-0.025 MG PO TABS: 2.0000 | ORAL_TABLET | Freq: Four times a day (QID) | ORAL | 0 refills | Status: DC | PRN

## 2022-08-28 MED ORDER — LEVOFLOXACIN 500 MG PO TABS: 500.0000 mg | ORAL_TABLET | Freq: Every day | ORAL | 0 refills | Status: AC

## 2022-08-28 NOTE — Progress Notes (Signed)
Subjective:  Patient ID: Carol [name], female    DOB: 1960-11-16  Age: 62 y.o. MRN: [id number]  CC: Generalized Body Aches, Diarrhea, Cough, Nasal Congestion, and Headache   HPI Carol [name] presents for aches and pains for 2. Weeks. Tick on back taken off 1.5 weeks ago was still small. Cough and congestion and diarrhea also started 2 weks ago. Some nasal DC is thick. Cough worse at night.     08/28/2022   10:47 AM 04/29/2022   11:08 AM 10/04/2021    8:56 AM  Depression screen PHQ 2/9  Decreased Interest 0 0 0  Down, Depressed, Hopeless 0 0 0  PHQ - 2 Score 0 0 0  Altered sleeping  0 1  Tired, decreased energy  2 1  Change in appetite  0 0  Feeling bad or failure about yourself   0 0  Trouble concentrating  0 0  Moving slowly or fidgety/restless  0 0  Suicidal thoughts  0 0  PHQ-9 Score  2 2  Difficult doing work/chores  Not difficult at all Not difficult at all    History Carol [name] has a past medical history of Chronic nausea, Diverticula of colon, GERD (gastroesophageal reflux disease), History of kidney stones, and Ovarian cyst.   She has a past surgical history that includes Cholecystectomy (1997); Appendectomy (2010); Tubal ligation; Kidney stone surgery; Breast lumpectomy; Colonoscopy (2010); Colonoscopy (N/A, 03/09/2014); Esophagogastroduodenoscopy (N/A, 03/09/2014); Shoulder surgery (Right); Colonoscopy with propofol (N/A, 09/29/2019); and polypectomy (09/29/2019).   Her family history includes Atrial fibrillation in her sister; Brain cancer in her sister; CAD (age of onset: 7) in her mother; Cancer in her mother; Heart disease in her father; Lung cancer in her sister.She reports that she has been smoking cigarettes. She has a 35 pack-year smoking history. She has never used smokeless tobacco. She reports current alcohol use. She reports that she does not use drugs.    ROS Review of Systems  Constitutional:  Negative for activity change, appetite change, chills and fever.   HENT:  Positive for congestion, postnasal drip, rhinorrhea and sinus pressure. Negative for ear discharge, ear pain, hearing loss, nosebleeds, sneezing and trouble swallowing.   Respiratory:  Negative for chest tightness and shortness of breath.   Cardiovascular:  Negative for chest pain and palpitations.  Gastrointestinal:  Positive for diarrhea.  Skin:  Negative for rash.    Objective:  BP 136/74   Pulse 64   Temp 97.9 F (36.6 C)   Ht 5\' 5"  (1.651 m)   Wt 156 lb 12.8 oz (71.1 kg)   SpO2 97%   BMI 26.09 kg/m   BP Readings from Last 3 Encounters:  08/28/22 136/74  04/29/22 123/67  10/04/21 117/76    Wt Readings from Last 3 Encounters:  08/28/22 156 lb 12.8 oz (71.1 kg)  04/29/22 154 lb (69.9 kg)  04/10/22 144 lb (65.3 kg)     Physical Exam Constitutional:      General: She is not in acute distress.    Appearance: She is well-developed.  Cardiovascular:     Rate and Rhythm: Normal rate and regular rhythm.  Pulmonary:     Breath sounds: Normal breath sounds.  Musculoskeletal:        General: Normal range of motion.  Skin:    General: Skin is warm and dry.  Neurological:     Mental Status: She is alert and oriented to person, place, and time.       Assessment & Plan:  Carol [name] was seen today for generalized body aches, diarrhea, cough, nasal congestion and headache.  Diagnoses and all orders for this visit:  Sinobronchitis -     CBC with Differential/Platelet -     CMP14+EGFR -     Novel Coronavirus, NAA ([facility])  Dysuria -     Urinalysis -     Urine Culture -     CBC with Differential/Platelet -     CMP14+EGFR  Diarrhea of presumed infectious origin -     CBC with Differential/Platelet -     CMP14+EGFR -     Novel Coronavirus, NAA ([facility])  Other orders -     diphenoxylate-atropine (LOMOTIL) 2.5-0.025 MG tablet; Take 2 tablets by mouth 4 (four) times daily as needed for diarrhea or loose stools. -     levofloxacin (LEVAQUIN) 500 MG tablet;  Take 1 tablet (500 mg total) by mouth daily. For 10 days       I have discontinued Helane Gunther. Kosak's fluticasone, promethazine, azelastine, ondansetron, naproxen, and doxycycline. I am also having her start on diphenoxylate-atropine and levofloxacin. Additionally, I am having her maintain her acetaminophen, albuterol, and omeprazole.  Allergies as of 08/28/2022       Reactions   Penicillins Shortness Of Breath, Swelling, Other (See Comments)   Has patient had a PCN reaction causing immediate rash, facial/tongue/throat swelling, SOB or lightheadedness with hypotension: Yes Has patient had a PCN reaction causing severe rash involving mucus membranes or skin necrosis: No Has patient had a PCN reaction that required hospitalization No Has patient had a PCN reaction occurring within the last 10 years: No If all of the above answers are "NO", then may proceed with Cephalosporin use. Childhood Has patient had a PCN reaction causing immediate rash, facial/tongue/throat swelling, SOB or lightheadedness with hypotension: Yes Has patient had a PCN reaction causing severe rash involving mucus membranes or skin necrosis: No Has patient had a PCN reaction that required hospitalization No Has patient had a PCN reaction occurring within the last 10 years: No If all of the above answers are "NO", then may proceed with Cephalosporin use.   Sulfa Antibiotics    Nausea, itching, headache Nausea, itching, headache        Medication List        Accurate as of August 28, 2022 11:59 PM. If you have any questions, ask your nurse or doctor.          STOP taking these medications    azelastine 0.1 % nasal spray Commonly known as: ASTELIN Stopped by: Eshani Maestre   doxycycline 100 MG tablet Commonly known as: VIBRA-TABS Stopped by: Coby Shrewsberry   fluticasone 50 MCG/ACT nasal spray Commonly known as: FLONASE Stopped by: Terra Aveni   naproxen 500 MG tablet Commonly known as:  NAPROSYN Stopped by: Leana Springston   ondansetron 4 MG disintegrating tablet Commonly known as: Zofran ODT Stopped by: Izabellah Dadisman   promethazine 25 MG tablet Commonly known as: PHENERGAN Stopped by: Ollie Delano       TAKE these medications    acetaminophen 500 MG tablet Commonly known as: TYLENOL Take 1,000 mg by mouth as needed for moderate pain.   albuterol 108 (90 Base) MCG/ACT inhaler Commonly known as: VENTOLIN HFA Inhale 2 puffs into the lungs every 6 (six) hours as needed for wheezing or shortness of breath.   diphenoxylate-atropine 2.5-0.025 MG tablet Commonly known as: Lomotil Take 2 tablets by mouth 4 (four) times daily as needed for diarrhea or loose stools. Started  by: Mechele Claude   levofloxacin 500 MG tablet Commonly known as: LEVAQUIN Take 1 tablet (500 mg total) by mouth daily. For 10 days Started by: Yasiel Goyne   omeprazole 20 MG capsule Commonly known as: PRILOSEC Take 1 capsule (20 mg total) by mouth daily.         Follow-up: Return if symptoms worsen or fail to improve.  Mechele Claude, M.D.

## 2022-08-29 ENCOUNTER — Encounter: Payer: Self-pay | Admitting: Family Medicine

## 2022-08-29 LAB — CBC WITH DIFFERENTIAL/PLATELET
Basophils Absolute: 0.1 10*3/uL (ref 0.0–0.2)
Basos: 1 %
EOS (ABSOLUTE): 0.1 10*3/uL (ref 0.0–0.4)
Eos: 1 %
Hematocrit: 41.6 % (ref 34.0–46.6)
Hemoglobin: 13.9 g/dL (ref 11.1–15.9)
Immature Grans (Abs): 0 10*3/uL (ref 0.0–0.1)
Immature Granulocytes: 0 %
Lymphocytes Absolute: 2 10*3/uL (ref 0.7–3.1)
Lymphs: 26 %
MCH: 30.6 pg (ref 26.6–33.0)
MCHC: 33.4 g/dL (ref 31.5–35.7)
MCV: 92 fL (ref 79–97)
Monocytes Absolute: 0.6 10*3/uL (ref 0.1–0.9)
Monocytes: 7 %
Neutrophils Absolute: 4.9 10*3/uL (ref 1.4–7.0)
Neutrophils: 65 %
Platelets: 274 10*3/uL (ref 150–450)
RBC: 4.54 x10E6/uL (ref 3.77–5.28)
RDW: 11.3 % — ABNORMAL LOW (ref 11.7–15.4)
WBC: 7.5 10*3/uL (ref 3.4–10.8)

## 2022-08-29 LAB — CMP14+EGFR
ALT: 23 IU/L (ref 0–32)
AST: 21 IU/L (ref 0–40)
Albumin: 4.6 g/dL (ref 3.8–4.9)
Alkaline Phosphatase: 84 IU/L (ref 44–121)
BUN/Creatinine Ratio: 16 (ref 9–23)
BUN: 13 mg/dL (ref 6–24)
Bilirubin Total: 0.3 mg/dL (ref 0.0–1.2)
CO2: 24 mmol/L (ref 20–29)
Calcium: 9.5 mg/dL (ref 8.7–10.2)
Chloride: 102 mmol/L (ref 96–106)
Creatinine, Ser: 0.8 mg/dL (ref 0.57–1.00)
Globulin, Total: 2 g/dL (ref 1.5–4.5)
Glucose: 77 mg/dL (ref 70–99)
Potassium: 4.3 mmol/L (ref 3.5–5.2)
Sodium: 142 mmol/L (ref 134–144)
Total Protein: 6.6 g/dL (ref 6.0–8.5)
eGFR: 85 mL/min/{1.73_m2} (ref >59)

## 2022-08-29 LAB — NOVEL CORONAVIRUS, NAA: SARS-CoV-2, NAA: NOT DETECTED

## 2022-08-30 ENCOUNTER — Encounter: Payer: Self-pay | Admitting: Family Medicine

## 2022-08-30 LAB — URINE CULTURE

## 2022-08-30 NOTE — Progress Notes (Signed)
Hello  Margeret,    Your lab result is normal and/or stable.Some minor variations that are not significant are commonly marked abnormal, but do not represent any medical problem for you.   Best regards,  Ethyle Tiedt, M.D.

## 2022-09-15 ENCOUNTER — Encounter: Payer: Self-pay | Admitting: Family Medicine

## 2022-12-24 ENCOUNTER — Other Ambulatory Visit: Payer: Self-pay | Admitting: Family Medicine

## 2022-12-24 MED ORDER — DIPHENOXYLATE-ATROPINE 2.5-0.025 MG PO TABS: 2.0000 | ORAL_TABLET | Freq: Four times a day (QID) | ORAL | 0 refills | Status: AC | PRN

## 2023-01-29 ENCOUNTER — Other Ambulatory Visit: Payer: Self-pay | Admitting: Family Medicine

## 2023-01-29 DIAGNOSIS — J069 Acute upper respiratory infection, unspecified: Secondary | ICD-10-CM

## 2023-01-30 MED ORDER — ALBUTEROL SULFATE HFA 108 (90 BASE) MCG/ACT IN AERS
2.0000 | INHALATION_SPRAY | Freq: Four times a day (QID) | RESPIRATORY_TRACT | 0 refills | Status: AC | PRN
Start: 2023-01-30 — End: ?

## 2023-04-23 DIAGNOSIS — Z0279 Encounter for issue of other medical certificate: Secondary | ICD-10-CM

## 2023-04-24 ENCOUNTER — Other Ambulatory Visit: Payer: Self-pay | Admitting: Family Medicine

## 2023-12-07 ENCOUNTER — Ambulatory Visit: Payer: Self-pay

## 2023-12-07 NOTE — Telephone Encounter (Signed)
 Appt made.

## 2023-12-07 NOTE — Telephone Encounter (Signed)
 FYI Only or Action Required?: FYI only for provider.  Patient was last seen in primary care on 08/28/2022 by Zollie Lowers, MD.  Called Nurse Triage reporting Dysuria.  Symptoms began several weeks ago.  Interventions attempted: Rest, hydration, or home remedies.  Symptoms are: gradually worsening.  Triage Disposition: See Physician Within 24 Hours (overriding See HCP Within 4 Hours (Or PCP Triage))  Patient/caregiver understands and will follow disposition?: Yes  Reason for Disposition  Side (flank) or lower back pain present  Answer Assessment - Initial Assessment Questions 1. SYMPTOM: What's the main symptom you're concerned about? (e.g., frequency, incontinence)     Burning, increased frequency, intermittent flank pain, malodorous urine  2. ONSET:      2 weeks  3. PAIN: Is there any pain?:     Yes, burning  4. CAUSE: What do you think is causing the symptoms?     UTI  5. OTHER SYMPTOMS: Do you have any other symptoms? (e.g., blood in urine, fever, flank pain, pain with urination)     Mild flank pain  Protocols used: Urinary Symptoms-A-AH Copied from CRM #[id number]. Topic: Clinical - Red Word Triage >> Dec 07, 2023  9:48 AM Farrel B wrote: [facility] that prompted transfer to Nurse Triage: patient called requesting a telephone visit asked about what she needed to come in for she stated possible uti, burning and frequency with urination and now having a really odd smell.

## 2023-12-08 ENCOUNTER — Ambulatory Visit

## 2023-12-08 ENCOUNTER — Encounter: Payer: Self-pay | Admitting: Family Medicine

## 2023-12-08 ENCOUNTER — Telehealth

## 2023-12-08 DIAGNOSIS — R350 Frequency of micturition: Secondary | ICD-10-CM

## 2023-12-08 DIAGNOSIS — B49 Unspecified mycosis: Secondary | ICD-10-CM

## 2023-12-08 DIAGNOSIS — R300 Dysuria: Secondary | ICD-10-CM | POA: Diagnosis not present

## 2023-12-08 DIAGNOSIS — B379 Candidiasis, unspecified: Secondary | ICD-10-CM | POA: Diagnosis not present

## 2023-12-08 LAB — URINALYSIS, ROUTINE W REFLEX MICROSCOPIC
Bilirubin, UA: NEGATIVE
Glucose, UA: NEGATIVE
Ketones, UA: NEGATIVE
Leukocytes,UA: NEGATIVE
Nitrite, UA: NEGATIVE
Protein,UA: NEGATIVE
Specific Gravity, UA: <=1.005 — AB (ref 1.005–1.030)
Urobilinogen, Ur: 0.2 mg/dL (ref 0.2–1.0)
pH, UA: 6.5 (ref 5.0–7.5)

## 2023-12-08 LAB — MICROSCOPIC EXAMINATION
Renal Epithel, UA: NONE SEEN /HPF
WBC, UA: NONE SEEN /HPF (ref 0–5)

## 2023-12-08 MED ORDER — FLUCONAZOLE 150 MG PO TABS
ORAL_TABLET | ORAL | 0 refills | Status: AC
Start: 2023-12-08 — End: ?

## 2023-12-08 NOTE — Progress Notes (Signed)
 Virtual Visit via Video   I connected with patient on 12/08/23 at 0800 by a video enabled telemedicine application and verified that I am speaking with the correct person using two identifiers.  Location patient: Home Location provider: [facility] Persons participating in the virtual visit: Patient and Provider  I discussed the limitations of evaluation and management by telemedicine and the availability of in person appointments. The patient expressed understanding and agreed to proceed.  Subjective:   HPI:  Pt presents today for  Chief Complaint  Patient presents with   Urinary Tract Infection    Carol [name] is a 63 year old female who presents with symptoms suggestive of a urinary tract infection.  She has been experiencing dysuria, increased urinary frequency, and malodorous urine for the past couple of weeks. She also notes difficulty in fully emptying her bladder and mild left-sided pain. No fever, chills, confusion, or weakness. She has not observed hematuria.  She has a history of urinary tract infections and kidney stones in the 1980s. She has tried over-the-counter treatments without significant relief.  She is allergic to sulfa antibiotics and penicillins.       ROS per HPI  Patient Active Problem List   Diagnosis Date Noted   Impingement syndrome of right shoulder 06/13/2019   GERD (gastroesophageal reflux disease) 05/09/2014   Mucosal abnormality of stomach    Chronic nausea 02/07/2014   Dyspepsia 02/07/2014   History of colonic polyps 02/07/2014   Menopausal state 01/02/2014   Chronic neck pain 08/01/2013   Numbness and tingling of right arm 08/01/2013    Social History   Tobacco Use   Smoking status: Every Day    Current packs/day: 1.00    Average packs/day: 1 pack/day for 35.0 years (35.0 ttl pk-yrs)    Types: Cigarettes   Smokeless tobacco: Never   Tobacco comments:    Smokes about a pack a day  Substance Use  Topics   Alcohol use: Yes    Comment: socially/rarely    Current Outpatient Medications:    fluconazole (DIFLUCAN) 150 MG tablet, 1 po q week x 4 weeks, Disp: 4 tablet, Rfl: 0   acetaminophen  (TYLENOL ) 500 MG tablet, Take 1,000 mg by mouth as needed for moderate pain. , Disp: , Rfl:    albuterol  (VENTOLIN  HFA) 108 (90 Base) MCG/ACT inhaler, Inhale 2 puffs into the lungs every 6 (six) hours as needed for wheezing or shortness of breath., Disp: 18 g, Rfl: 0   diphenoxylate -atropine  (LOMOTIL ) 2.5-0.025 MG tablet, Take 2 tablets by mouth 4 (four) times daily as needed for diarrhea or loose stools., Disp: 30 tablet, Rfl: 0   levofloxacin  (LEVAQUIN ) 500 MG tablet, Take 1 tablet (500 mg total) by mouth daily. For 10 days, Disp: 10 tablet, Rfl: 0   omeprazole  (PRILOSEC) 20 MG capsule, Take 1 capsule (20 mg total) by mouth daily., Disp: 30 capsule, Rfl: 3  Allergies  Allergen Reactions   Penicillins Shortness Of Breath, Swelling and Other (See Comments)    Has patient had a PCN reaction causing immediate rash, facial/tongue/throat swelling, SOB or lightheadedness with hypotension: Yes Has patient had a PCN reaction causing severe rash involving mucus membranes or skin necrosis: No Has patient had a PCN reaction that required hospitalization No Has patient had a PCN reaction occurring within the last 10 years: No If all of the above answers are NO, then may proceed with Cephalosporin use.  Childhood Has patient had a PCN reaction causing immediate  rash, facial/tongue/throat swelling, SOB or lightheadedness with hypotension: Yes Has patient had a PCN reaction causing severe rash involving mucus membranes or skin necrosis: No Has patient had a PCN reaction that required hospitalization No Has patient had a PCN reaction occurring within the last 10 years: No If all of the above answers are NO, then may proceed with Cephalosporin use.   Sulfa Antibiotics     Nausea, itching, headache Nausea,  itching, headache    Objective:   There were no vitals taken for this visit.  Patient is well-developed, well-nourished in no acute distress.  Resting comfortably at home.  Head is normocephalic, atraumatic.  No labored breathing.  Speech is clear and coherent with logical content.  Patient is alert and oriented at baseline.    Assessment and Plan:   Sheria was seen today for urinary tract infection.  Diagnoses and all orders for this visit:  Dysuria -     Urine Culture -     Urinalysis, Routine w reflex microscopic  Frequency of urination -     Urine Culture -     Urinalysis, Routine w reflex microscopic  Yeast cells and fungal elements present on diagnostic testing -     fluconazole (DIFLUCAN) 150 MG tablet; 1 po q week x 4 weeks     Urinary symptoms (dysuria, frequency, incomplete emptying) Symptoms of dysuria, frequency, and incomplete emptying for a couple of weeks. No hematuria or significant lower back pain. Symptoms suggestive of urinary tract infection, but urinalysis does not confirm infection. Differential diagnosis includes candiduria, which is being treated. - Collect urine sample for urinalysis - Review urinalysis results and adjust treatment plan if necessary  Candiduria (yeast in urine) Yeast present in urine, indicating candiduria. No evidence of urinary tract infection based on current urinalysis. Symptoms include burning, frequency, and incomplete emptying. No fever, chills, or significant flank pain. History of urinary tract infections and kidney stones. Allergic to sulfa antibiotics and penicillins. - Prescribe Diflucan 150 mg once weekly for 4 weeks - Increase water  intake - Avoid caffeine and other bladder irritants - Culture urine and review results for further treatment if necessary        Return if symptoms worsen or fail to improve.  Rosaline Bruns, FNP-C [facility] [address] [place], [place] [address] [phone]  12/08/2023  Time spent with the patient: 15 minutes, of which >50% was spent in obtaining information about symptoms, reviewing previous labs, evaluations, and treatments, counseling about condition (please see the discussed topics above), and developing a plan to further investigate it; had a number of questions which I addressed.

## 2023-12-09 ENCOUNTER — Ambulatory Visit: Payer: Self-pay | Admitting: Family Medicine

## 2023-12-09 DIAGNOSIS — N3000 Acute cystitis without hematuria: Secondary | ICD-10-CM

## 2023-12-11 LAB — URINE CULTURE

## 2023-12-15 MED ORDER — FOSFOMYCIN TROMETHAMINE 3 G PO PACK
3.0000 g | PACK | Freq: Once | ORAL | 0 refills | Status: AC
Start: 2023-12-15 — End: 2023-12-15
